# Patient Record
Sex: Male | Born: 1970 | Race: White | Hispanic: No | Smoking: Current some day smoker
Health system: Southern US, Community
[De-identification: ages and names within clinical notes are randomized; demographics above are authoritative.]

## PROBLEM LIST (undated history)

## (undated) DIAGNOSIS — I209 Angina pectoris, unspecified: Secondary | ICD-10-CM

## (undated) DIAGNOSIS — C801 Malignant (primary) neoplasm, unspecified: Secondary | ICD-10-CM

## (undated) DIAGNOSIS — F319 Bipolar disorder, unspecified: Secondary | ICD-10-CM

## (undated) DIAGNOSIS — R918 Other nonspecific abnormal finding of lung field: Secondary | ICD-10-CM

## (undated) DIAGNOSIS — K759 Inflammatory liver disease, unspecified: Secondary | ICD-10-CM

## (undated) DIAGNOSIS — F329 Major depressive disorder, single episode, unspecified: Secondary | ICD-10-CM

## (undated) DIAGNOSIS — K219 Gastro-esophageal reflux disease without esophagitis: Secondary | ICD-10-CM

## (undated) DIAGNOSIS — F32A Depression, unspecified: Secondary | ICD-10-CM

## (undated) HISTORY — PX: TONSILLECTOMY AND ADENOIDECTOMY: SHX28

## (undated) HISTORY — PX: WISDOM TOOTH EXTRACTION: SHX21

## (undated) HISTORY — PX: APPENDECTOMY: SHX54

---
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
## 2004-01-28 ENCOUNTER — Emergency Department (HOSPITAL_COMMUNITY): Admission: EM | Admit: 2004-01-28 | Discharge: 2004-01-28 | Payer: Self-pay | Admitting: Emergency Medicine

## 2004-12-10 ENCOUNTER — Inpatient Hospital Stay (HOSPITAL_COMMUNITY): Admission: EM | Admit: 2004-12-10 | Discharge: 2004-12-13 | Payer: Self-pay | Admitting: Psychiatry

## 2004-12-10 ENCOUNTER — Ambulatory Visit: Payer: Self-pay | Admitting: Psychiatry

## 2007-08-27 ENCOUNTER — Emergency Department (HOSPITAL_COMMUNITY): Admission: EM | Admit: 2007-08-27 | Discharge: 2007-08-27 | Payer: Self-pay | Admitting: Emergency Medicine

## 2010-09-27 ENCOUNTER — Encounter: Payer: Self-pay | Admitting: Psychiatry

## 2011-06-11 LAB — HEPATITIS B CORE ANTIBODY, TOTAL: Hep B Core Total Ab: NEGATIVE

## 2011-06-11 LAB — HEPATITIS C ANTIBODY: HCV Ab: POSITIVE — AB

## 2013-12-17 ENCOUNTER — Emergency Department (HOSPITAL_COMMUNITY)
Admission: EM | Admit: 2013-12-17 | Discharge: 2013-12-17 | Disposition: A | Discharge status: Home / Self Care | Payer: Self-pay | Attending: Emergency Medicine | Admitting: Emergency Medicine

## 2013-12-17 ENCOUNTER — Encounter (HOSPITAL_COMMUNITY): Payer: Self-pay | Admitting: Emergency Medicine

## 2013-12-17 ENCOUNTER — Emergency Department (HOSPITAL_COMMUNITY): Payer: Self-pay

## 2013-12-17 DIAGNOSIS — F411 Generalized anxiety disorder: Secondary | ICD-10-CM | POA: Insufficient documentation

## 2013-12-17 DIAGNOSIS — F17200 Nicotine dependence, unspecified, uncomplicated: Secondary | ICD-10-CM | POA: Insufficient documentation

## 2013-12-17 DIAGNOSIS — R071 Chest pain on breathing: Secondary | ICD-10-CM | POA: Insufficient documentation

## 2013-12-17 DIAGNOSIS — Z8679 Personal history of other diseases of the circulatory system: Secondary | ICD-10-CM | POA: Insufficient documentation

## 2013-12-17 DIAGNOSIS — R0682 Tachypnea, not elsewhere classified: Secondary | ICD-10-CM | POA: Insufficient documentation

## 2013-12-17 DIAGNOSIS — R079 Chest pain, unspecified: Secondary | ICD-10-CM

## 2013-12-17 DIAGNOSIS — R000 Tachycardia, unspecified: Secondary | ICD-10-CM | POA: Insufficient documentation

## 2013-12-17 HISTORY — DX: Angina pectoris, unspecified: I20.9

## 2013-12-17 LAB — PROTIME-INR
INR: 0.99 (ref 0.00–1.49)
Prothrombin Time: 12.9 seconds (ref 11.6–15.2)

## 2013-12-17 LAB — I-STAT TROPONIN, ED: Troponin i, poc: 0 ng/mL (ref 0.00–0.08)

## 2013-12-17 LAB — COMPREHENSIVE METABOLIC PANEL
ALK PHOS: 80 U/L (ref 39–117)
ALT: 35 U/L (ref 0–53)
AST: 31 U/L (ref 0–37)
Albumin: 3.7 g/dL (ref 3.5–5.2)
BILIRUBIN TOTAL: 0.3 mg/dL (ref 0.3–1.2)
BUN: 13 mg/dL (ref 6–23)
CHLORIDE: 98 meq/L (ref 96–112)
CO2: 29 mEq/L (ref 19–32)
CREATININE: 0.84 mg/dL (ref 0.50–1.35)
Calcium: 9.4 mg/dL (ref 8.4–10.5)
GFR calc Af Amer: >90 mL/min (ref >90)
GFR calc non Af Amer: >90 mL/min (ref >90)
Glucose, Bld: 114 mg/dL — ABNORMAL HIGH (ref 70–99)
Potassium: 4.1 mEq/L (ref 3.7–5.3)
Sodium: 140 mEq/L (ref 137–147)
TOTAL PROTEIN: 7.8 g/dL (ref 6.0–8.3)

## 2013-12-17 LAB — CBC
HEMATOCRIT: 41.9 % (ref 39.0–52.0)
HEMOGLOBIN: 14.3 g/dL (ref 13.0–17.0)
MCH: 29.2 pg (ref 26.0–34.0)
MCHC: 34.1 g/dL (ref 30.0–36.0)
MCV: 85.7 fL (ref 78.0–100.0)
Platelets: 182 10*3/uL (ref 150–400)
RBC: 4.89 MIL/uL (ref 4.22–5.81)
RDW: 14.1 % (ref 11.5–15.5)
WBC: 5.2 10*3/uL (ref 4.0–10.5)

## 2013-12-17 LAB — TROPONIN I: Troponin I: <0.3 ng/mL (ref <0.30)

## 2013-12-17 LAB — PRO B NATRIURETIC PEPTIDE: PRO B NATRI PEPTIDE: 36.1 pg/mL (ref 0–125)

## 2013-12-17 LAB — C-REACTIVE PROTEIN: CRP: <0.5 mg/dL — ABNORMAL LOW (ref <0.60)

## 2013-12-17 MED ORDER — NALOXONE HCL 0.4 MG/ML IJ SOLN
0.4000 mg | Freq: Once | INTRAMUSCULAR | Status: DC
  Filled 2013-12-17: qty 1

## 2013-12-17 MED ORDER — ASPIRIN 81 MG PO CHEW
324.0000 mg | CHEWABLE_TABLET | Freq: Once | ORAL | Status: AC
  Administered 2013-12-17: 324 mg via ORAL
  Filled 2013-12-17: qty 4

## 2013-12-17 NOTE — ED Notes (Signed)
Patient refusing narcan stated to friend at bedside "they are trying to make me sick" Doctor notified.

## 2013-12-17 NOTE — ED Notes (Signed)
Pt unable to void at this time. 

## 2013-12-17 NOTE — ED Provider Notes (Signed)
CSN: [id number]     Arrival date & time 12/17/13  7096 History   First MD Initiated Contact with Patient 12/17/13 [phone]     Chief Complaint  Patient presents with  . Chest Pain  . Shortness of Breath      HPI  Patient presents with concerns of sternal and left chest pain. Pain began earlier today, though the exact time of onset is unclear. Patient notes that he has been using cocaine and heroin in the hours prior to onset of pain. Since onset has been persistent, nonradiating, sore, severe. There is mild associated dyspnea, no syncope. No cough, no fever, no chills. No vomiting. Patient injects heroin, denies cutaneous changes. Since onset there was no clear alleviating or exacerbating factors.  Notably, after my initial evaluation the patient immediately requested emergent fax to be sent to the court house because the patient has a court appearance today.   Past Medical History  Diagnosis Date  . Anginal pain    History reviewed. No pertinent past surgical history. History reviewed. No pertinent family history. History  Substance Use Topics  . Smoking status: Current Every Day Smoker  . Smokeless tobacco: Not on file  . Alcohol Use: No    Review of Systems  Constitutional:       Per HPI, otherwise negative  HENT:       Per HPI, otherwise negative  Respiratory:       Per HPI, otherwise negative  Cardiovascular:       Per HPI, otherwise negative  Gastrointestinal: Negative for vomiting.  Endocrine:       Negative aside from HPI  Genitourinary:       Neg aside from HPI   Musculoskeletal:       Per HPI, otherwise negative  Skin: Negative.   Neurological: Negative for syncope.      Allergies  Review of patient's allergies indicates no known allergies.  Home Medications  No current outpatient prescriptions on file. BP 137/81  Pulse 92  Temp(Src) 97.7 F (36.5 C) (Oral)  Resp 22  SpO2 99% Physical Exam  Nursing note and vitals  reviewed. Constitutional: He is oriented to person, place, and time. He appears well-developed. No distress.  Anxious middle-aged male sitting upright, speaking clearly  HENT:  Head: Normocephalic and atraumatic.  Eyes: Conjunctivae and EOM are normal.  Cardiovascular: Regular rhythm.  Tachycardia present.   No murmur heard. Pulmonary/Chest: No stridor. Tachypnea noted. No respiratory distress. He has no decreased breath sounds.  Abdominal: He exhibits no distension.  Musculoskeletal: He exhibits no edema.  Neurological: He is alert and oriented to person, place, and time. He displays no atrophy and no tremor. No cranial nerve deficit or sensory deficit. He exhibits normal muscle tone. He displays no seizure activity.  Skin: Skin is warm and dry.     Psychiatric: His mood appears anxious. Cognition and memory are not impaired.    ED Course  Procedures (including critical care time) Labs Review Labs Reviewed  COMPREHENSIVE METABOLIC PANEL - Abnormal; Notable for the following:    Glucose, Bld 114 (*)    All other components within normal limits  CBC  PRO B NATRIURETIC PEPTIDE  PROTIME-INR  TROPONIN I  URINE RAPID DRUG SCREEN (HOSP PERFORMED)  URINALYSIS, ROUTINE W REFLEX MICROSCOPIC  C-REACTIVE PROTEIN   Imaging Review No results found.   EKG Interpretation   Date/Time:  Monday December 17 2013 08:35:34 EDT Ventricular Rate:  105 PR Interval:  150 QRS Duration: 78 QT  Interval:  316 QTC Calculation: 417 R Axis:   85 Text Interpretation:  Sinus tachycardia Anterior infarct , age  undetermined Abnormal ECG Sinus tachycardia Artifact Abnormal ekg  Confirmed by Carmin Muskrat  MD ([id number]) on 12/17/2013 8:40:26 AM      12:15 PM Patient is chest pain-free. I had a lengthy conversation with the patient about substance abuse cessation. We also discussed the need for her primary care followup.  MDM   Patient presents with pain.  Notably, the patient has been using cocaine  and heroin within the past day.  Patient is initially tachycardic, tachypneic, but these resolved and he is chest pain-free.  Patient's evaluation here including serial troponins is reassuring.  I had a lengthy conversation with the patient about the need to stop cocaine and heroin use.  He is discharged in stable condition.  Carmin Muskrat, MD 12/17/13 [phone]

## 2013-12-17 NOTE — ED Notes (Signed)
Pt is refusing to give urine. Pt States "I have given y'all everything that y'all need to figure out what is wrong with my chest".

## 2013-12-17 NOTE — ED Notes (Signed)
Patient and patient friend at bedside on phone states when he got off phone that there needs to be a fax sent immediate to the courts within 15 minutes.  Doctor stated would be able to give a Doctors note. Friend stated he will get arrested if no fax is sent.  [place] department notified to speak with patient and friend at bedside.

## 2013-12-17 NOTE — ED Notes (Signed)
Pt c/o mid sternal CP with SOB; pt hyperventilating at present; pt sts pain is constant and severe; pt admits to heroin and cocaine use yesterday

## 2013-12-17 NOTE — ED Notes (Signed)
Police officer spoke with patient and returned after calling the court they are aware patient is hospital and instructed patient to contact appropriate personal. Verbalized understanding.

## 2013-12-17 NOTE — Discharge Instructions (Signed)
As discussed, the single most important thing you can do is to stop using cocaine and heroin.  Please follow up with your primary care physician for further evaluation of chest pain.    Emergency Department Resource Guide 1) Find a Doctor and Pay Out of Pocket Although you won't have to find out who is covered by your insurance plan, it is a good idea to ask around and get recommendations. You will then need to call the office and see if the doctor you have chosen will accept you as a new patient and what types of options they offer for patients who are self-pay. Some doctors offer discounts or will set up payment plans for their patients who do not have insurance, but you will need to ask so you aren't surprised when you get to your appointment.  2) Contact Your Local Health Department Not all health departments have doctors that can see patients for sick visits, but many do, so it is worth a call to see if yours does. If you don't know where your local health department is, you can check in your phone book. The CDC also has a tool to help you locate your state's health department, and many state websites also have listings of all of their local health departments.  3) Find a [facility] If your illness is not likely to be very severe or complicated, you may want to try a walk in clinic. These are popping up all over the country in pharmacies, drugstores, and shopping centers. They're usually staffed by nurse practitioners or physician assistants that have been trained to treat common illnesses and complaints. They're usually fairly quick and inexpensive. However, if you have serious medical issues or chronic medical problems, these are probably not your best option.  No Primary Care Doctor: - Call Health Connect at  [phone] - they can help you locate a primary care doctor that  accepts your insurance, provides certain services, etc. - Physician Referral Service- [phone]  Chronic Pain  Problems: Organization         Address  Phone   Notes  [facility]  [phone] Patients need to be referred by their primary care doctor.   Medication Assistance: Organization         Address  Phone   Notes  [facility] Medication [facility] [address] [place], [place] [address] [phone] --Must be a resident of [facility] -- Must have NO insurance coverage whatsoever (no Medicaid/ Medicare, etc.) -- The pt. MUST have a primary care doctor that directs their care regularly and follows them in the community   [facility]ist  [phone]   [employer]  [phone]    Agencies that provide inexpensive medical care: Organization         Address  Phone   Notes  [place]  [phone]   [facility]    [phone]   [facility] [place], [place] [address] [phone]   [place] [address] [place] [phone]   Planned Parenthood    [phone]   [facility]    [phone]   [place] and [place] [address], [place] Phone:  [phone], Fax:  [phone] Hours of Operation:  9 am - 6 pm, M-F.  Also accepts Medicaid/Medicare and self-pay.  [facility] for Children  [place] [address], [address], [place] Phone: [phone], Fax: [phone]. Hours of Operation:  8:30 am - 5:30 pm, M-F.  Also accepts Medicaid and self-pay.  [facility] [place] [address], [place] Phone: [phone]   [place], [place], [place] [phone], Ext. [phone] Mondays & Thursdays: 7-9 AM.  First 15 patients are seen on a first come, first serve basis.    [place] Providers:  Organization         Address  Phone   Notes  [facility] [address], [place] [phone] Also  accepts self-pay patients.  [facility] [address], [place]  [phone]   [place], [address], [place] [phone]   [facility] [address], [place] [phone]   Lucianne Lei [address], [place]   [phone] Only accepts [place] Access [place] patients after they have their name applied to their card.   Self-Pay (no insurance) in [facility]:  Organization         Address  Phone   Notes  Sickle Cell Patients, [facility] [place] [phone]   [facility] [place] [phone]   [facility] [place]  [place], [place], [place] [phone]   [facility]/Dr. Osei-Bonsu  [address] [place] or [address], [place] [phone] Phone number for both [place] and [place] locations is the same.  Urgent Medical and [facility] [address], [place] [phone]   [facility] [address], [place] or [address] [phone]   [facility] [address] [place] [phone], phone; [phone], fax Sees patients 1st and 3rd Saturday of every month.  Must not qualify for public or private insurance (i.e. Medicaid, Medicare, [place] Health Choice, Veterans' Benefits)  Household income should be no more than 200% of the poverty level The clinic cannot treat you if you are pregnant or think you are pregnant  Sexually transmitted diseases are not treated at the clinic.   Dental Care: Organization         Address  Phone  Notes  [facility] Department of [facility] [place] [phone] Accepts children up to age 79 who are enrolled in [place] or [place]; pregnant  women with a Medicaid card; and children who have applied for Medicaid or [place] Health Choice, but were declined, whose parents can pay a reduced fee at time of service.  [facility]  [address], [place] [phone] Accepts children up to age 77 who are enrolled in [place] or [place]; pregnant women with a Medicaid card; and children who have applied for Medicaid or [place] Health Choice, but were declined, whose parents can pay a reduced fee at time of service.  [place] Adult [facility] Access PROGRAM  [place] [phone] Patients are seen by appointment only. Walk-ins are not accepted. [place] will see patients 3 years of age and older. Monday - Tuesday (8am-5pm) Most Wednesdays (8:30-5pm) $30 per visit, cash only  [place]  Access PROGRAM  [address], [facility] [phone] Patients are seen by appointment only. Walk-ins are not accepted. [place] will see patients 37 years of age and older. One Wednesday Evening (Monthly: Volunteer Based).  $30 per visit, cash only  [place]  [phone] for adults; Children under age 77, call Graduate Pediatric Dentistry at [phone]. Children aged 79-14, please call [phone] to request a pediatric application.  Dental services are provided in all areas of dental care including fillings, crowns and bridges, complete and partial dentures, implants, gum treatment, root canals, and extractions. Preventive care is also provided. Treatment is provided to both adults and children. Patients are selected via a lottery and there is often a waiting list.   [facility] [address], [place]  [phone] [email or web address]   [facility] [address] [place], [place] [phone], Ext. [phone] Second and Fourth Thursday of each month, opens at 6:30 AM; Clinic ends at 9 AM.  Patients are  seen on a first-come first-served basis, and a limited number are seen during each clinic.   [facility]  [address] [place] Danker [place], [place] [phone]   Eligibility Requirements You must have lived in [place], [place], or [place] counties for at least the last three months.   You cannot be eligible for state or federal sponsored [employer], including [employer], [place], or [employer].   You generally cannot be eligible for healthcare insurance through your employer.    How to apply: Eligibility screenings are held every Tuesday and Wednesday afternoon from 1:00 pm until 4:00 pm. You do not need an appointment for the interview!  [facility] [address], [place], [place]   [place]  [place] Department  [place]  [phone]    Behavioral Health Resources in the Community: Intensive Outpatient Programs Organization         Address  Phone  Notes  [place]. [address] [place], [place] [phone]   [facility] [address], [place], [place]   ADS: Alcohol & Drug Svcs [address] [place], [place]   [place] [address],  [place], [place] or [phone]   Substance Abuse Resources Organization         Address  Phone  Notes  Alcohol and Drug Services  [phone]   [employer]  [phone]   [facility] [place]   Chinita Pester  [phone]   Residential & Outpatient Substance Abuse Program  [phone]   Psychological Services Organization         Address  Phone  Notes  [facility] [address] [place]  [place]  [phone]   [place] [address] [place] [phone] or [phone]    Mobile Crisis  Teams Organization         Address  Phone  Notes  [facility], Mobile Crisis Care Unit  [phone]   [facility] Psychotherapeutic Services  [address] [place], [place]   [place] [address], [place] [phone]    Self-Help/Support Groups Organization         Address  Phone             Notes  Mental Health  [place] - variety of support groups  336- H3156881 Call for more information  Narcotics Anonymous (NA), [facility] [address], [employer]   2 meetings at this location   Special educational needs teacher         Address  Phone  Notes  ASAP Residential Treatment [place],    [place]  [phone]   [facility]  [address] [place] 725366, [place], [place]   [place], [place] [phone] Admissions: 8am-3pm M-F  Incentives Substance [place] [address],    [place], [place] [phone]   [facility] [address] [place], [place], [place]   [facility] [address],  [place], [place]   [facility]ight Programs - Intensive Outpatient [address], [place], [place]   [facility] ([address].) [place].,  [place], [place] [phone] or [phone]   [facility]idential Treatment Services (RTS) [address] [place], [place] Accepts Medicaid  [facility] [place] [address],  [place] [phone] Substance Abuse/Addiction Treatment   [facility] Organization         Address  Phone  Notes  [facility]  [phone]   Domenic Schwab, PhD [address] Arlis [place], [place]   [phone] or [phone]   [place], [place] [phone]   [facility] [address], [place], [place] [phone]  Insurance/Medicaid/sponsorship through [facility] and Families [address] [place] [address], [address] [place]                                    [place], [place] [phone] [place] [address], [place] [phone]    Dr. Adele Schilder  [phone]   [facility] Dept. 1) [address], [place] 2) [place] 3)  [place] [address], [place] [phone]  [phone]   [place] [phone] or [phone] (After Hours)

## 2013-12-17 NOTE — ED Notes (Signed)
PT ambulated with baseline gait; VSS; A&Ox3; no signs of distress; respirations even and unlabored; skin warm and dry; no questions upon discharge.  

## 2013-12-17 NOTE — ED Notes (Signed)
Pt reports no chest pain at this time.

## 2019-09-25 ENCOUNTER — Emergency Department (HOSPITAL_COMMUNITY): Payer: Medicaid Other

## 2019-09-25 ENCOUNTER — Emergency Department (HOSPITAL_COMMUNITY)
Admission: EM | Admit: 2019-09-25 | Discharge: 2019-09-25 | Discharge status: Against Medical Advice | Payer: Medicaid Other | Attending: Emergency Medicine | Admitting: Emergency Medicine

## 2019-09-25 ENCOUNTER — Other Ambulatory Visit: Payer: Self-pay

## 2019-09-25 ENCOUNTER — Encounter (HOSPITAL_COMMUNITY): Payer: Self-pay | Admitting: Emergency Medicine

## 2019-09-25 DIAGNOSIS — S2241XA Multiple fractures of ribs, right side, initial encounter for closed fracture: Secondary | ICD-10-CM | POA: Diagnosis not present

## 2019-09-25 DIAGNOSIS — Y999 Unspecified external cause status: Secondary | ICD-10-CM | POA: Diagnosis not present

## 2019-09-25 DIAGNOSIS — X500XXA Overexertion from strenuous movement or load, initial encounter: Secondary | ICD-10-CM | POA: Insufficient documentation

## 2019-09-25 DIAGNOSIS — C7951 Secondary malignant neoplasm of bone: Secondary | ICD-10-CM | POA: Insufficient documentation

## 2019-09-25 DIAGNOSIS — Y939 Activity, unspecified: Secondary | ICD-10-CM | POA: Diagnosis not present

## 2019-09-25 DIAGNOSIS — F17210 Nicotine dependence, cigarettes, uncomplicated: Secondary | ICD-10-CM | POA: Insufficient documentation

## 2019-09-25 DIAGNOSIS — Y929 Unspecified place or not applicable: Secondary | ICD-10-CM | POA: Insufficient documentation

## 2019-09-25 DIAGNOSIS — S299XXA Unspecified injury of thorax, initial encounter: Secondary | ICD-10-CM | POA: Diagnosis present

## 2019-09-25 LAB — CBC WITH DIFFERENTIAL/PLATELET
Abs Immature Granulocytes: 0.03 10*3/uL (ref 0.00–0.07)
Basophils Absolute: 0.1 10*3/uL (ref 0.0–0.1)
Basophils Relative: 1 %
Eosinophils Absolute: 0.2 10*3/uL (ref 0.0–0.5)
Eosinophils Relative: 2 %
HCT: 43.9 % (ref 39.0–52.0)
Hemoglobin: 14.4 g/dL (ref 13.0–17.0)
Immature Granulocytes: 0 %
Lymphocytes Relative: 26 %
Lymphs Abs: 2.2 10*3/uL (ref 0.7–4.0)
MCH: 28.2 pg (ref 26.0–34.0)
MCHC: 32.8 g/dL (ref 30.0–36.0)
MCV: 85.9 fL (ref 80.0–100.0)
Monocytes Absolute: 1 10*3/uL (ref 0.1–1.0)
Monocytes Relative: 11 %
Neutro Abs: 5.2 10*3/uL (ref 1.7–7.7)
Neutrophils Relative %: 60 %
Platelets: 259 10*3/uL (ref 150–400)
RBC: 5.11 MIL/uL (ref 4.22–5.81)
RDW: 13.3 % (ref 11.5–15.5)
WBC: 8.6 10*3/uL (ref 4.0–10.5)
nRBC: 0 % (ref 0.0–0.2)

## 2019-09-25 LAB — BASIC METABOLIC PANEL
Anion gap: 8 (ref 5–15)
BUN: 10 mg/dL (ref 6–20)
CO2: 23 mmol/L (ref 22–32)
Calcium: 7.6 mg/dL — ABNORMAL LOW (ref 8.9–10.3)
Chloride: 106 mmol/L (ref 98–111)
Creatinine, Ser: 0.49 mg/dL — ABNORMAL LOW (ref 0.61–1.24)
GFR calc Af Amer: >60 mL/min (ref >60)
GFR calc non Af Amer: >60 mL/min (ref >60)
Glucose, Bld: 91 mg/dL (ref 70–99)
Potassium: 3 mmol/L — ABNORMAL LOW (ref 3.5–5.1)
Sodium: 137 mmol/L (ref 135–145)

## 2019-09-25 LAB — SEDIMENTATION RATE: Sed Rate: 50 mm/hr — ABNORMAL HIGH (ref 0–16)

## 2019-09-25 LAB — C-REACTIVE PROTEIN: CRP: 2.7 mg/dL — ABNORMAL HIGH (ref <1.0)

## 2019-09-25 MED ORDER — OXYCODONE-ACETAMINOPHEN 5-325 MG PO TABS
1.0000 | ORAL_TABLET | Freq: Once | ORAL | Status: AC
  Administered 2019-09-25: 1 via ORAL
  Filled 2019-09-25: qty 1

## 2019-09-25 MED ORDER — GADOBUTROL 1 MMOL/ML IV SOLN
7.5000 mL | Freq: Once | INTRAVENOUS | Status: AC | PRN
  Administered 2019-09-25: 7.5 mL via INTRAVENOUS

## 2019-09-25 MED ORDER — HYDROMORPHONE HCL 1 MG/ML IJ SOLN
1.0000 mg | Freq: Once | INTRAMUSCULAR | Status: AC
  Administered 2019-09-25: 1 mg via INTRAVENOUS
  Filled 2019-09-25: qty 1

## 2019-09-25 NOTE — ED Triage Notes (Signed)
Pt reports pain in back along with pain radiation down both legs and pain with walking. Pt reports pain has been ongoing for the last week.

## 2019-09-25 NOTE — ED Provider Notes (Signed)
[place] DEPT Provider Note   CSN: [id number] Arrival date & time: 09/25/19  0402     History Chief Complaint  Patient presents with  . Back Pain    Nicolas Welch is a 49 y.o. male.  Presents to ER with chief complaint back pain.  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  No trauma.  Progressive pain, now severe, improved with rest, worse with ambulation or bearing weight.  Radiates down left hip, left leg.  Denies prior history of back pain.  Has been taking opiates off the street as well as heroin with some improvement in his pain.  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  Denies prior history of endocarditis, bacteremia, epidural abscess.  Patient also states for the last couple weeks he has been having intermittent right shoulder pain, felt a popping sensation after lifting something heavy, this is currently not hurting him.  Also has some pain on the right side of his chest wall, that was associated with the shoulder.  No fevers or chills.  Patient denies any bladder or bowel incontinence, no saddle anesthesia.  States he feels his left leg is somewhat weak and occasionally has to pick it up.  20 pound unintentional weight loss over the past month.  HPI     Past Medical History:  Diagnosis Date  . [place]inal pain ([place]     There are no problems to display for this patient.   History reviewed. No pertinent surgical history.     History reviewed. No pertinent family history.  Social History   Tobacco Use  . Smoking status: Current Every Day Smoker    Packs/day: 1.00    Types: Cigarettes  . Smokeless tobacco: Never Used  Substance Use Topics  . Alcohol use: No  . Drug use: Yes    Types: Cocaine, IV    Comment: heroin    Home Medications Prior to Admission medications   Not  on File    Allergies    Patient has no known allergies.  Review of Systems   Review of Systems  Constitutional: Negative for chills and fever.  HENT: Negative for ear pain and sore throat.   Eyes: Negative for pain and visual disturbance.  Respiratory: Negative for cough and shortness of breath.   Cardiovascular: Negative for chest pain and palpitations.  Gastrointestinal: Negative for abdominal pain and vomiting.  Genitourinary: Negative for dysuria and hematuria.  Musculoskeletal: Positive for back pain and myalgias. Negative for arthralgias.  Skin: Negative for color change and rash.  Neurological: Negative for seizures and syncope.  All other systems reviewed and are negative.   Physical Exam Updated Vital Signs BP 117/78 (BP Location: Right Arm)   Pulse 86   Temp 98 F (36.7 C) (Oral)   Resp 17   Ht 6' (1.829 m)   Wt 70.3 kg   SpO2 95%   BMI 21.02 kg/m   Physical Exam Vitals and nursing note reviewed.  Constitutional:      Appearance: He is well-developed.  HENT:     Head: Normocephalic and atraumatic.  Eyes:     Conjunctiva/sclera: Conjunctivae normal.  Cardiovascular:     Rate and Rhythm: Normal rate and regular rhythm.     Heart sounds: No murmur.  Pulmonary:     Effort: Pulmonary effort is normal. No respiratory  distress.     Breath sounds: Normal breath sounds.  Abdominal:     Palpations: Abdomen is soft.     Tenderness: There is no abdominal tenderness.  Musculoskeletal:     Cervical back: Neck supple.     Comments: No C-spine tenderness, there is focal tenderness over the lower T-spine and upper L-spine, no deformity or step-off noted No tenderness over extremities  Skin:    General: Skin is warm and dry.  Neurological:     Mental Status: He is alert.     Comments: And O x3, sensation to light touch intact throughout extremities;  RLE - 5/5 strength in hip flexion/extension, leg flexion/extension, plantar flexion, dorsiflexion and FHL LLE: 4+/5  strength in hip flexion, otherwise 5/5 throughout hip extension, leg flexion/extension, plantar flexion, dorsiflexion and FHL RUE: 5/5 strenght throughout LUE: 5/5 strenght throughout     ED Results / Procedures / Treatments   Labs (all labs ordered are listed, but only abnormal results are displayed) Labs Reviewed  BASIC METABOLIC PANEL - Abnormal; Notable for the following components:      Result Value   Potassium 3.0 (*)    Creatinine, Ser 0.49 (*)    Calcium 7.6 (*)    All other components within normal limits  SEDIMENTATION RATE - Abnormal; Notable for the following components:   Sed Rate 50 (*)    All other components within normal limits  C-REACTIVE PROTEIN - Abnormal; Notable for the following components:   CRP 2.7 (*)    All other components within normal limits  RESPIRATORY PANEL BY RT PCR (FLU A&B, COVID)  CBC WITH DIFFERENTIAL/PLATELET    EKG None  Radiology DG Ribs Unilateral W/Chest Right  Result Date: 09/25/2019 CLINICAL DATA:  Recent fall several days ago with right-sided chest pain, initial encounter EXAM: RIGHT RIBS AND CHEST - 3+ VIEW COMPARISON:  None. FINDINGS: Cardiac shadow is within normal limits. The lungs are well aerated bilaterally. Right basilar atelectatic changes are seen. No pneumothorax is noted. The known rib fractures involving the first, second and third ribs on the right posteriorly are again seen and stable. Varying degrees of healing are noted. Undisplaced right tenth rib fracture anterior laterally is noted as well. IMPRESSION: Multiple right-sided rib fractures as described above. No pneumothorax is noted. Mild right basilar atelectasis is seen. Electronically Signed   By: Inez Catalina M.D.   On: 09/25/2019 09:45   DG Thoracic Spine 2 View  Result Date: 09/25/2019 CLINICAL DATA:  Recent fall with upper back pain, initial encounter EXAM: THORACIC SPINE 2 VIEWS COMPARISON:  None. FINDINGS: Vertebral body height is well maintained. Mild  osteophytic changes are noted within the cervical spine. No paraspinal mass lesion is seen. The pedicles are within normal limits. Known right rib fractures are again visualized and stable. IMPRESSION: No acute abnormality in the thoracic spine. Electronically Signed   By: Inez Catalina M.D.   On: 09/25/2019 09:46   DG Lumbar Spine 2-3 Views  Result Date: 09/25/2019 CLINICAL DATA:  Fall several days ago with back pain, initial encounter EXAM: LUMBAR SPINE - 2-3 VIEW COMPARISON:  None. FINDINGS: Five lumbar type vertebral bodies are well visualized. Vertebral body height is well maintained. No anterolisthesis is noted. The right pedicle at L3 is incomplete inferiorly raising suspicion for underlying bony lesion. IMPRESSION: Irregularity of the right pedicle at L3. No other focal abnormality is noted. MRI of the lumbar spine with and without contrast material is recommended for further evaluation. Electronically Signed  By: Inez Catalina M.D.   On: 09/25/2019 09:50   DG Shoulder Right  Result Date: 09/25/2019 CLINICAL DATA:  Recent fall several days ago with right shoulder pain, initial encounter EXAM: RIGHT SHOULDER - 2+ VIEW COMPARISON:  None. FINDINGS: Right clavicle and proximal right humerus are within normal limits. There are fractures of the first second and third ribs on the right with varying degrees of healing. No pneumothorax is noted. IMPRESSION: Multiple right rib fractures with varying degrees of healing. Electronically Signed   By: Inez Catalina M.D.   On: 09/25/2019 09:44   MR THORACIC SPINE W WO CONTRAST  Result Date: 09/25/2019 CLINICAL DATA:  Back pain. Abnormal radiographs. EXAM: MRI THORACIC AND LUMBAR SPINE WITHOUT AND WITH CONTRAST TECHNIQUE: Multiplanar and multiecho pulse sequences of the thoracic and lumbar spine were obtained without and with intravenous contrast. CONTRAST:  7.46mL GADAVIST GADOBUTROL 1 MMOL/ML IV SOLN COMPARISON:  Radiographs, same date. FINDINGS: MRI THORACIC  SPINE FINDINGS Alignment:  Normal Vertebrae: Extensive metastatic disease involving the thoracic vertebral bodies and posterior elements. No obvious pathologic fracture. No spinal canal compromise. Cord:  No definite cord lesions or cord compression. Paraspinal and other soft tissues: Perivertebral tumor noted at T5 and T6. Multiple rib lesions are also noted. No obvious lung lesions or mediastinal tumor. Disc levels: No canal stenosis is demonstrated. No obvious foraminal lesions. Small left paracentral disc protrusion noted at T8-9 and small right paracentral disc protrusion at T9-10. MRI LUMBAR SPINE FINDINGS Segmentation: There are five lumbar type vertebral bodies. The last full intervertebral disc space is labeled L5-S1. Alignment:  Normal Vertebrae: Extensive metastatic bone disease with innumerable lesions throughout the lumbar spine and sacrum. No pathologic fracture or spinal canal stenosis. Conus medullaris: Extends to the T12-L1 level and appears normal. Paraspinal and other soft tissues: No significant paraspinal or retroperitoneal findings. No obvious renal or pancreatic lesions. No retroperitoneal mass or adenopathy. Disc levels: Diffuse disc bulge at L3-4 with mild spinal and lateral recess stenosis. IMPRESSION: 1. 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 No pathologic fracture or spinal canal compromise. 2. Small thoracic disc protrusions but no significant canal or foraminal stenosis. 3. Diffuse disc bulge at L3-4 with mild spinal and lateral recess stenosis. 4. No obvious primary tumor is identified. Recommend metastatic workup with a CT scan of the chest, abdomen and pelvis with IV and oral contrast. Electronically Signed   By: Marijo Sanes M.D.   On: 09/25/2019 15:08   MR Lumbar Spine W Wo Contrast  Result Date: 09/25/2019 CLINICAL DATA:  Back pain. Abnormal radiographs. EXAM: MRI THORACIC AND LUMBAR SPINE WITHOUT AND WITH CONTRAST TECHNIQUE:  Multiplanar and multiecho pulse sequences of the thoracic and lumbar spine were obtained without and with intravenous contrast. CONTRAST:  7.38mL GADAVIST GADOBUTROL 1 MMOL/ML IV SOLN COMPARISON:  Radiographs, same date. FINDINGS: MRI THORACIC SPINE FINDINGS Alignment:  Normal Vertebrae: Extensive metastatic disease involving the thoracic vertebral bodies and posterior elements. No obvious pathologic fracture. No spinal canal compromise. Cord:  No definite cord lesions or cord compression. Paraspinal and other soft tissues: Perivertebral tumor noted at T5 and T6. Multiple rib lesions are also noted. No obvious lung lesions or mediastinal tumor. Disc levels: No canal stenosis is demonstrated. No obvious foraminal lesions. Small left paracentral disc protrusion noted at T8-9 and small right paracentral disc protrusion at T9-10. MRI LUMBAR SPINE FINDINGS Segmentation: There are five lumbar type vertebral bodies. The last full intervertebral disc space is labeled L5-S1.  Alignment:  Normal Vertebrae: Extensive metastatic bone disease with innumerable lesions throughout the lumbar spine and sacrum. No pathologic fracture or spinal canal stenosis. Conus medullaris: Extends to the T12-L1 level and appears normal. Paraspinal and other soft tissues: No significant paraspinal or retroperitoneal findings. No obvious renal or pancreatic lesions. No retroperitoneal mass or adenopathy. Disc levels: Diffuse disc bulge at L3-4 with mild spinal and lateral recess stenosis. IMPRESSION: 1. 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 No pathologic fracture or spinal canal compromise. 2. Small thoracic disc protrusions but no significant canal or foraminal stenosis. 3. Diffuse disc bulge at L3-4 with mild spinal and lateral recess stenosis. 4. No obvious primary tumor is identified. Recommend metastatic workup with a CT scan of the chest, abdomen and pelvis with IV and oral contrast.  Electronically Signed   By: Marijo Sanes M.D.   On: 09/25/2019 15:08    Procedures Procedures (including critical care time)  Medications Ordered in ED Medications  HYDROmorphone (DILAUDID) injection 1 mg (1 mg Intravenous Given 09/25/19 0856)  oxyCODONE-acetaminophen (PERCOCET/ROXICET) 5-325 MG per tablet 1 tablet (1 tablet Oral Given 09/25/19 1309)  gadobutrol (GADAVIST) 1 MMOL/ML injection 7.5 mL (7.5 mLs Intravenous Contrast Given 09/25/19 1433)    ED Course  I have reviewed the triage vital signs and the nursing notes.  Pertinent labs & imaging results that were available during my care of the patient were reviewed by me and considered in my medical decision making (see chart for details).  Clinical Course as of Sep 24 1552  Tue Sep 25, 2019  1532 Updated patient on results, will check CT chest abdomen pelvis to further evaluate   [RD]    Clinical Course User Index [RD] Lucrezia Starch, MD   MDM Rules/Calculators/A&P                      49 year old male presents to ER with chief complaint of back pain.  On exam patient was noted be well-appearing, noted some tenderness over his chest wall as well as focal bony tenderness over his T and L-spine.  X-rays concerning for couple of rib fractures of uncertain acuity.  No recent falls, no tachypnea, no hypoxia, no pneumonia or pneumothorax.  More concerning though his MRI demonstrated extensive metastatic disease.  There was question of some mild left leg weakness, MRI also showed some mild spinal stenosis and lumbar spine, neurosurgery will evaluate patient.  To further evaluate and identify etiology for his metastatic disease, CT chest abdomen pelvis with IV and oral contrast was ordered.  While awaiting these orders, patient signed out to Dr. Zenia Resides.  Plan and disposition pending CTs. patient updated on results and likely new malignancy.  Final Clinical Impression(s) / ED Diagnoses Final diagnoses:  Spine metastasis ([place])  Closed  fracture of multiple ribs of right side, initial encounter    Rx / DC Orders ED Discharge Orders    None       Lucrezia Starch, MD 09/25/19 1555

## 2019-09-25 NOTE — ED Provider Notes (Signed)
Patient signed to me by Dr. Roslynn Amble pending abdominal chest CTs for evaluation of malignancy.  Was informed by nursing that patient does not wish to stay at this time.  I explained to the patient at length about the risk of metastatic cancer and the need for inpatient evaluation he has deferred.  He understands this.  He was strongly encouraged to return or follow-up with his doctor   Lacretia Leigh, MD 09/25/19 1641

## 2019-09-25 NOTE — ED Notes (Signed)
Patient stated that he wanted to leave AMA, refused his CT scan and COVID swab. Explained the risks of leaving, MD Zenia Resides made aware of wishes to leave AMA, Zenia Resides spoke with patient and he still wanted to leave AMA.

## 2019-09-25 NOTE — Consult Note (Signed)
Chief Complaint   Chief Complaint  Patient presents with  . Back Pain    HPI   Consult requested by: Dr Roslynn Amble, EDP [facility] Reason for consult: thoracic and lumbar spinal mets  HPI: Nicolas Welch is a 49 y.o. male with history of HTN (no longer on medication), IVDU, current every day smoker who presented to ED for evaluation of progressive lower back pain over the past week. He complains primarily of midline LBP that radiates into left leg to posterolaterally to foot. Occasionally has pain in right leg. Endorses weakness in left leg with HF. No distal weakness. No bowel or bladder dysfunction, N/T. 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 NSY consultation requested.   There are no problems to display for this patient.   PMH: Past Medical History:  Diagnosis Date  . Anginal pain (HCC)     PSH: History reviewed. No pertinent surgical history.  (Not in a hospital admission)   SH: Social History   Tobacco Use  . Smoking status: Current Every Day Smoker    Packs/day: 1.00    Types: Cigarettes  . Smokeless tobacco: Never Used  Substance Use Topics  . Alcohol use: No  . Drug use: Yes    Types: Cocaine, IV    Comment: heroin    MEDS: Prior to Admission medications   Not on File    ALLERGY: No Known Allergies  Social History   Tobacco Use  . Smoking status: Current Every Day Smoker    Packs/day: 1.00    Types: Cigarettes  . Smokeless tobacco: Never Used  Substance Use Topics  . Alcohol use: No     History reviewed. No pertinent family history.   ROS   Review of Systems  Constitutional: Positive for weight loss. Negative for chills and fever.  HENT: Negative.   Eyes: Negative.   Respiratory: Negative.   Cardiovascular: Negative.   Gastrointestinal: Negative.   Genitourinary: Negative.   Musculoskeletal: Positive for back pain, joint pain and myalgias.  Negative for neck pain.  Skin: Negative.   Neurological: Positive for tingling and weakness. Negative for dizziness, tremors, sensory change, speech change, focal weakness and headaches.    Exam   Vitals:   09/25/19 0744 09/25/19 1446  BP:  117/78  Pulse: 100 86  Resp:  17  Temp: 98 F (36.7 C)   SpO2:  95%   General appearance: WDWN, NAD Eyes: No scleral injection Cardiovascular: Regular rate and rhythm without murmurs, rubs, gallops. No edema or variciosities. Distal pulses normal. Pulmonary: Effort normal, non-labored breathing Musculoskeletal:     Muscle tone upper extremities: Normal    Muscle tone lower extremities: Normal    Motor exam: Upper Extremities Deltoid Bicep Tricep Grip  Right 5/5 5/5 5/5 5/5  Left 5/5 5/5 5/5 5/5   Lower Extremity IP Quad PF DF EHL  Right 5/5 5/5 5/5 5/5 5/5  Left 4/4+/5 5/5 5/5 5/5 5/5   Neurological Mental Status:    - Patient is awake, alert, oriented to person, place, month, year, and situation    - Patient is able to give a clear and coherent history.    - No signs of aphasia or neglect Cranial Nerves    - II: Visual Fields are full. PERRL    - III/IV/VI: EOMI without ptosis or diploplia.     - V: Facial sensation is grossly normal    -  VII: Facial movement is symmetric.     - VIII: hearing is intact to voice    - X: Uvula elevates symmetrically    - XI: Shoulder shrug is symmetric.    - XII: tongue is midline without atrophy or fasciculations.  Sensory: Sensation grossly intact to LT Deep Tendon Reflexes    - 2+ and symmetric in the biceps and patellae.   Results - Imaging/Labs   Results for orders placed or performed during the hospital encounter of 09/25/19 (from the past 48 hour(s))  CBC with Differential     Status: None   Collection Time: 09/25/19  8:51 AM  Result Value Ref Range   WBC 8.6 4.0 - 10.5 K/uL   RBC 5.11 4.22 - 5.81 MIL/uL   Hemoglobin 14.4 13.0 - 17.0 g/dL   HCT 43.9 39.0 - 52.0 %   MCV 85.9 80.0 -  100.0 fL   MCH 28.2 26.0 - 34.0 pg   MCHC 32.8 30.0 - 36.0 g/dL   RDW 13.3 11.5 - 15.5 %   Platelets 259 150 - 400 K/uL   nRBC 0.0 0.0 - 0.2 %   Neutrophils Relative % 60 %   Neutro Abs 5.2 1.7 - 7.7 K/uL   Lymphocytes Relative 26 %   Lymphs Abs 2.2 0.7 - 4.0 K/uL   Monocytes Relative 11 %   Monocytes Absolute 1.0 0.1 - 1.0 K/uL   Eosinophils Relative 2 %   Eosinophils Absolute 0.2 0.0 - 0.5 K/uL   Basophils Relative 1 %   Basophils Absolute 0.1 0.0 - 0.1 K/uL   Immature Granulocytes 0 %   Abs Immature Granulocytes 0.03 0.00 - 0.07 K/uL    Comment: Performed at [facility], [place] [address], [place], [place] [address]  Basic metabolic panel     Status: Abnormal   Collection Time: 09/25/19  8:51 AM  Result Value Ref Range   Sodium 137 135 - 145 mmol/L   Potassium 3.0 (L) 3.5 - 5.1 mmol/L   Chloride 106 98 - 111 mmol/L   CO2 23 22 - 32 mmol/L   Glucose, Bld 91 70 - 99 mg/dL   BUN 10 6 - 20 mg/dL   Creatinine, Ser 0.49 (L) 0.61 - 1.24 mg/dL   Calcium 7.6 (L) 8.9 - 10.3 mg/dL   GFR calc non Af Amer >60 >60 mL/min   GFR calc Af Amer >60 >60 mL/min   Anion gap 8 5 - 15    Comment: Performed at [facility], [place] [address], [place], [place] [address]  Sedimentation rate     Status: Abnormal   Collection Time: 09/25/19  8:51 AM  Result Value Ref Range   Sed Rate 50 (H) 0 - 16 mm/hr    Comment: Performed at [facility], [place] [address] [place], [place] [address]  C-reactive protein     Status: Abnormal   Collection Time: 09/25/19  8:51 AM  Result Value Ref Range   CRP 2.7 (H) <1.0 mg/dL    Comment: Performed at [facility], [place] [address], [place], [place] [address]    DG Ribs Unilateral W/Chest Right  Result Date: 09/25/2019 CLINICAL DATA:  Recent fall several days ago with right-sided chest pain, initial encounter EXAM: RIGHT RIBS AND CHEST - 3+ VIEW COMPARISON:  None. FINDINGS: Cardiac shadow is  within normal limits. The lungs are well aerated bilaterally. Right basilar atelectatic changes are seen. No pneumothorax is noted. The known rib fractures  involving the first, second and third ribs on the right posteriorly are again seen and stable. Varying degrees of healing are noted. Undisplaced right tenth rib fracture anterior laterally is noted as well. IMPRESSION: Multiple right-sided rib fractures as described above. No pneumothorax is noted. Mild right basilar atelectasis is seen. Electronically Signed   By: Inez Catalina M.D.   On: 09/25/2019 09:45   DG Thoracic Spine 2 View  Result Date: 09/25/2019 CLINICAL DATA:  Recent fall with upper back pain, initial encounter EXAM: THORACIC SPINE 2 VIEWS COMPARISON:  None. FINDINGS: Vertebral body height is well maintained. Mild osteophytic changes are noted within the cervical spine. No paraspinal mass lesion is seen. The pedicles are within normal limits. Known right rib fractures are again visualized and stable. IMPRESSION: No acute abnormality in the thoracic spine. Electronically Signed   By: Inez Catalina M.D.   On: 09/25/2019 09:46   DG Lumbar Spine 2-3 Views  Result Date: 09/25/2019 CLINICAL DATA:  Fall several days ago with back pain, initial encounter EXAM: LUMBAR SPINE - 2-3 VIEW COMPARISON:  None. FINDINGS: Five lumbar type vertebral bodies are well visualized. Vertebral body height is well maintained. No anterolisthesis is noted. The right pedicle at L3 is incomplete inferiorly raising suspicion for underlying bony lesion. IMPRESSION: Irregularity of the right pedicle at L3. No other focal abnormality is noted. MRI of the lumbar spine with and without contrast material is recommended for further evaluation. Electronically Signed   By: Inez Catalina M.D.   On: 09/25/2019 09:50   DG Shoulder Right  Result Date: 09/25/2019 CLINICAL DATA:  Recent fall several days ago with right shoulder pain, initial encounter EXAM: RIGHT SHOULDER - 2+ VIEW  COMPARISON:  None. FINDINGS: Right clavicle and proximal right humerus are within normal limits. There are fractures of the first second and third ribs on the right with varying degrees of healing. No pneumothorax is noted. IMPRESSION: Multiple right rib fractures with varying degrees of healing. Electronically Signed   By: Inez Catalina M.D.   On: 09/25/2019 09:44   MR THORACIC SPINE W WO CONTRAST  Result Date: 09/25/2019 CLINICAL DATA:  Back pain. Abnormal radiographs. EXAM: MRI THORACIC AND LUMBAR SPINE WITHOUT AND WITH CONTRAST TECHNIQUE: Multiplanar and multiecho pulse sequences of the thoracic and lumbar spine were obtained without and with intravenous contrast. CONTRAST:  7.55mL GADAVIST GADOBUTROL 1 MMOL/ML IV SOLN COMPARISON:  Radiographs, same date. FINDINGS: MRI THORACIC SPINE FINDINGS Alignment:  Normal Vertebrae: Extensive metastatic disease involving the thoracic vertebral bodies and posterior elements. No obvious pathologic fracture. No spinal canal compromise. Cord:  No definite cord lesions or cord compression. Paraspinal and other soft tissues: Perivertebral tumor noted at T5 and T6. Multiple rib lesions are also noted. No obvious lung lesions or mediastinal tumor. Disc levels: No canal stenosis is demonstrated. No obvious foraminal lesions. Small left paracentral disc protrusion noted at T8-9 and small right paracentral disc protrusion at T9-10. MRI LUMBAR SPINE FINDINGS Segmentation: There are five lumbar type vertebral bodies. The last full intervertebral disc space is labeled L5-S1. Alignment:  Normal Vertebrae: Extensive metastatic bone disease with innumerable lesions throughout the lumbar spine and sacrum. No pathologic fracture or spinal canal stenosis. Conus medullaris: Extends to the T12-L1 level and appears normal. Paraspinal and other soft tissues: No significant paraspinal or retroperitoneal findings. No obvious renal or pancreatic lesions. No retroperitoneal mass or adenopathy. Disc  levels: Diffuse disc bulge at L3-4 with mild spinal and lateral recess stenosis. IMPRESSION: 1. Extensive metastatic bone  disease involving the thoracic and lumbar spine and sacrum. No pathologic fracture or spinal canal compromise. 2. Small thoracic disc protrusions but no significant canal or foraminal stenosis. 3. Diffuse disc bulge at L3-4 with mild spinal and lateral recess stenosis. 4. No obvious primary tumor is identified. Recommend metastatic workup with a CT scan of the chest, abdomen and pelvis with IV and oral contrast. Electronically Signed   By: Marijo Sanes M.D.   On: 09/25/2019 15:08   MR Lumbar Spine W Wo Contrast  Result Date: 09/25/2019 CLINICAL DATA:  Back pain. Abnormal radiographs. EXAM: MRI THORACIC AND LUMBAR SPINE WITHOUT AND WITH CONTRAST TECHNIQUE: Multiplanar and multiecho pulse sequences of the thoracic and lumbar spine were obtained without and with intravenous contrast. CONTRAST:  7.11mL GADAVIST GADOBUTROL 1 MMOL/ML IV SOLN COMPARISON:  Radiographs, same date. FINDINGS: MRI THORACIC SPINE FINDINGS Alignment:  Normal Vertebrae: Extensive metastatic disease involving the thoracic vertebral bodies and posterior elements. No obvious pathologic fracture. No spinal canal compromise. Cord:  No definite cord lesions or cord compression. Paraspinal and other soft tissues: Perivertebral tumor noted at T5 and T6. Multiple rib lesions are also noted. No obvious lung lesions or mediastinal tumor. Disc levels: No canal stenosis is demonstrated. No obvious foraminal lesions. Small left paracentral disc protrusion noted at T8-9 and small right paracentral disc protrusion at T9-10. MRI LUMBAR SPINE FINDINGS Segmentation: There are five lumbar type vertebral bodies. The last full intervertebral disc space is labeled L5-S1. Alignment:  Normal Vertebrae: Extensive metastatic bone disease with innumerable lesions throughout the lumbar spine and sacrum. No pathologic fracture or spinal canal stenosis.  Conus medullaris: Extends to the T12-L1 level and appears normal. Paraspinal and other soft tissues: No significant paraspinal or retroperitoneal findings. No obvious renal or pancreatic lesions. No retroperitoneal mass or adenopathy. Disc levels: Diffuse disc bulge at L3-4 with mild spinal and lateral recess stenosis. IMPRESSION: 1. 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 No pathologic fracture or spinal canal compromise. 2. Small thoracic disc protrusions but no significant canal or foraminal stenosis. 3. Diffuse disc bulge at L3-4 with mild spinal and lateral recess stenosis. 4. No obvious primary tumor is identified. Recommend metastatic workup with a CT scan of the chest, abdomen and pelvis with IV and oral contrast. Electronically Signed   By: Marijo Sanes M.D.   On: 09/25/2019 15:08   Impression/Plan   49 y.o. male with acute, progressive lower back pain secondary to widespread thoracic and lumbar spinal metastases, no known primary.  There is no pathologic fracture or spinal canal compromise. There is a disc bulge at L3-4 that results in mild lateral recess stenosis, but his pain is most attributable to widespread metastatic disease. He has what appears to be more pain mediated weakness with left HF than true focal weakness. No indication for neurosurgerical intervention.   He will be undergoing further work up to identify possible primary. Will need Onc consult and likely palliative radiation.   Please call for any concerns.  Ferne Reus, PA-C [facility] [employer]

## 2019-09-26 ENCOUNTER — Telehealth: Payer: Self-pay | Admitting: *Deleted

## 2019-09-26 NOTE — Telephone Encounter (Signed)
EDCM placed call to pt at request of EDP to set up PCP appointment.  There was no answer.  Will continue to reach out to pt.

## 2019-10-02 ENCOUNTER — Emergency Department (HOSPITAL_COMMUNITY): Payer: Medicaid Other

## 2019-10-02 ENCOUNTER — Other Ambulatory Visit: Payer: Self-pay

## 2019-10-02 ENCOUNTER — Emergency Department (HOSPITAL_COMMUNITY)
Admission: EM | Admit: 2019-10-02 | Discharge: 2019-10-03 | Disposition: A | Discharge status: Home / Self Care | Payer: Medicaid Other | Attending: Emergency Medicine | Admitting: Emergency Medicine

## 2019-10-02 DIAGNOSIS — R918 Other nonspecific abnormal finding of lung field: Secondary | ICD-10-CM

## 2019-10-02 DIAGNOSIS — D381 Neoplasm of uncertain behavior of trachea, bronchus and lung: Secondary | ICD-10-CM | POA: Diagnosis not present

## 2019-10-02 DIAGNOSIS — M549 Dorsalgia, unspecified: Secondary | ICD-10-CM

## 2019-10-02 DIAGNOSIS — C7951 Secondary malignant neoplasm of bone: Secondary | ICD-10-CM | POA: Diagnosis not present

## 2019-10-02 DIAGNOSIS — F17210 Nicotine dependence, cigarettes, uncomplicated: Secondary | ICD-10-CM | POA: Insufficient documentation

## 2019-10-02 LAB — CBC
HCT: 41.9 % (ref 39.0–52.0)
Hemoglobin: 13.8 g/dL (ref 13.0–17.0)
MCH: 28.5 pg (ref 26.0–34.0)
MCHC: 32.9 g/dL (ref 30.0–36.0)
MCV: 86.6 fL (ref 80.0–100.0)
Platelets: 223 10*3/uL (ref 150–400)
RBC: 4.84 MIL/uL (ref 4.22–5.81)
RDW: 13.5 % (ref 11.5–15.5)
WBC: 9.7 10*3/uL (ref 4.0–10.5)
nRBC: 0 % (ref 0.0–0.2)

## 2019-10-02 LAB — COMPREHENSIVE METABOLIC PANEL
ALT: 19 U/L (ref 0–44)
AST: 22 U/L (ref 15–41)
Albumin: 3.5 g/dL (ref 3.5–5.0)
Alkaline Phosphatase: 122 U/L (ref 38–126)
Anion gap: 8 (ref 5–15)
BUN: 6 mg/dL (ref 6–20)
CO2: 27 mmol/L (ref 22–32)
Calcium: 9.2 mg/dL (ref 8.9–10.3)
Chloride: 102 mmol/L (ref 98–111)
Creatinine, Ser: 0.56 mg/dL — ABNORMAL LOW (ref 0.61–1.24)
GFR calc Af Amer: >60 mL/min (ref >60)
GFR calc non Af Amer: >60 mL/min (ref >60)
Glucose, Bld: 97 mg/dL (ref 70–99)
Potassium: 3.9 mmol/L (ref 3.5–5.1)
Sodium: 137 mmol/L (ref 135–145)
Total Bilirubin: 0.6 mg/dL (ref 0.3–1.2)
Total Protein: 8.2 g/dL — ABNORMAL HIGH (ref 6.5–8.1)

## 2019-10-02 MED ORDER — IOHEXOL 9 MG/ML PO SOLN
ORAL | Status: AC
  Administered 2019-10-02: 23:00:00 500 mL via ORAL
  Filled 2019-10-02: qty 1000

## 2019-10-02 MED ORDER — FENTANYL CITRATE (PF) 100 MCG/2ML IJ SOLN
50.0000 ug | INTRAMUSCULAR | Status: DC | PRN
  Administered 2019-10-02: 22:00:00 50 ug via INTRAVENOUS
  Filled 2019-10-02: qty 2

## 2019-10-02 MED ORDER — IOHEXOL 9 MG/ML PO SOLN
500.0000 mL | ORAL | Status: AC
  Administered 2019-10-02: 23:00:00 500 mL via ORAL

## 2019-10-02 MED ORDER — IOHEXOL 9 MG/ML PO SOLN: 500.0000 mL | ORAL | Status: AC

## 2019-10-02 MED ORDER — HYDROMORPHONE HCL 1 MG/ML IJ SOLN
1.0000 mg | Freq: Once | INTRAMUSCULAR | Status: AC
  Administered 2019-10-02: 23:00:00 1 mg via INTRAVENOUS
  Filled 2019-10-02: qty 1

## 2019-10-02 MED ORDER — IOHEXOL 300 MG/ML  SOLN: 100.0000 mL | Freq: Once | INTRAMUSCULAR | Status: DC | PRN

## 2019-10-02 MED ORDER — ONDANSETRON HCL 4 MG/2ML IJ SOLN
4.0000 mg | Freq: Once | INTRAMUSCULAR | Status: AC
  Administered 2019-10-02: 23:00:00 4 mg via INTRAVENOUS
  Filled 2019-10-02: qty 2

## 2019-10-02 NOTE — ED Triage Notes (Signed)
Patient arrived with complaints of back pain due to cancer. States he was here last week and supposed to get CT scans done but left against medical advice, stating he came back to be admitted and continue his care.

## 2019-10-02 NOTE — ED Notes (Signed)
Extra blue and gold top x2 sent to lab

## 2019-10-02 NOTE — ED Provider Notes (Signed)
[facility] DEPT Provider Note   CSN: [id number] Arrival date & time: 10/02/19  1926     History Chief Complaint  Patient presents with  . Back Pain    Nicolas Welch is a 49 y.o. male.  Patient presents to the emergency department with a chief complaint of back pain.  He was seen recently for low back pain, which was thought to be secondary to a lifting injury.  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  He was encouraged to undergo CT imaging for possible diagnosis of primary cancer.  Ultimately, the patient left AMA, but returns tonight to complete his work-up.  States that he has not really had any significant changes in his symptoms.  He continues to have back pain.  He reports left lower extremity weakness, but attributes this to pain.  Neurosurgery was consulted during his most recent emergency department evaluation, and it was deemed that there was no need for neurosurgical intervention at that time.  Patient has not followed up with anyone for this.  He denies any fevers or chills.  He states that he has had night sweats, but has never measured temperature.  He denies any bowel or bladder incontinence.  States that he has been slightly constipated.  The history is provided by the patient. No language interpreter was used.       Past Medical History:  Diagnosis Date  . Anginal pain ([place])     There are no problems to display for this patient.   No past surgical history on file.     No family history on file.  Social History   Tobacco Use  . Smoking status: Current Every Day Smoker    Packs/day: 1.00    Types: Cigarettes  . Smokeless tobacco: Never Used  Substance Use Topics  . Alcohol use: No  . Drug use: Yes    Types: Cocaine, IV    Comment: heroin    Home Medications Prior to Admission medications   Medication Sig Start Date End Date Taking? Authorizing Provider  ibuprofen (ADVIL) 200  MG tablet Take 200 mg by mouth every 6 (six) hours as needed for moderate pain.   Yes [provider]    Allergies    Patient has no known allergies.  Review of Systems   Review of Systems  All other systems reviewed and are negative.   Physical Exam Updated Vital Signs BP 112/77 (BP Location: Left Arm)   Pulse (!) 120   Temp 98.6 F (37 C) (Oral)   Resp 18   Ht 6' (1.829 m)   Wt 70.3 kg   SpO2 98%   BMI 21.02 kg/m   Physical Exam Vitals and nursing note reviewed.  Constitutional:      Appearance: He is well-developed.  HENT:     Head: Normocephalic and atraumatic.  Eyes:     Conjunctiva/sclera: Conjunctivae normal.  Cardiovascular:     Rate and Rhythm: Normal rate and regular rhythm.     Heart sounds: No murmur.  Pulmonary:     Effort: Pulmonary effort is normal. No respiratory distress.     Breath sounds: Normal breath sounds.     Comments: Lungs are clear to auscultation Abdominal:     Palpations: Abdomen is soft.     Tenderness: There is no abdominal tenderness.  Musculoskeletal:        General: Normal range of motion.     Cervical back: Neck supple.  Comments: Decreased strength in left lower extremity at the hip, thought secondary to pain, normal range of motion and strength of the knee and ankle  Right lower extremity range of motion strength is normal  Skin:    General: Skin is warm and dry.  Neurological:     Mental Status: He is alert and oriented to person, place, and time.  Psychiatric:        Mood and Affect: Mood normal.        Behavior: Behavior normal.     ED Results / Procedures / Treatments   Labs (all labs ordered are listed, but only abnormal results are displayed) Labs Reviewed  COMPREHENSIVE METABOLIC PANEL - Abnormal; Notable for the following components:      Result Value   Creatinine, Ser 0.56 (*)    Total Protein 8.2 (*)    All other components within normal limits  CBC    EKG None  Radiology CT Chest W  Contrast  Result Date: 10/03/2019 CLINICAL DATA:  Metastatic bone disease seen on prior MRI. Evaluate for primary EXAM: CT CHEST, ABDOMEN, AND PELVIS WITH CONTRAST TECHNIQUE: Multidetector CT imaging of the chest, abdomen and pelvis was performed following the standard protocol during bolus administration of intravenous contrast. CONTRAST:  162mL OMNIPAQUE IOHEXOL 300 MG/ML  SOLN COMPARISON:  MRI thoracic spine and lumbar spine 09/25/2019 FINDINGS: CT CHEST FINDINGS Cardiovascular: Heart is normal size. Aorta is normal caliber. Scattered aortic and coronary artery calcifications. Mediastinum/Nodes: Necrotic mediastinal and right hilar adenoma. Right paratracheal lymph node has a short axis diameter of 16 mm. Necrotic subcarinal adenopathy has a short axis diameter of 19 mm. Necrotic right hilar lymph nodes have a short axis diameter of 15 mm. No axillary or left hilar adenopathy. Lungs/Pleura: Central necrotic appearing right lower lobe mass. This measures approximately 3.8 x 3.3 cm. Peripheral postobstructive atelectasis or pneumonia in the right lower lobe. No effusions. Musculoskeletal: Multiple bilateral lytic expansile rib lesions, the largest in the posterior left 8th rib measuring up to 4.2 cm on image 33. Expansile lytic lesions noted in the anterior right 1st and 3rd ribs. Lytic lesions noted within the T11 in the T5 vertebral bodies. CT ABDOMEN PELVIS FINDINGS Hepatobiliary: 8 mm low-density lesion in the inferior right hepatic lobe, nonspecific, too small to characterize. Gallbladder contracted. Pancreas: No focal abnormality or ductal dilatation. Spleen: 5 mm low-density lesion within the superior pole of the spleen, nonspecific and too small to characterize. Adrenals/Urinary Tract: No adrenal abnormality. No focal renal abnormality. No stones or hydronephrosis. Urinary bladder is unremarkable. Stomach/Bowel: Stomach, large and small bowel grossly unremarkable. Vascular/Lymphatic: Aortic  atherosclerosis. No aneurysm. Prominent porta hepatis lymph nodes. Portacaval lymph node has a short axis diameter of 12 mm on image 59. Reproductive: No visible focal abnormality. Other: No free fluid or free air. Musculoskeletal: Extensive lytic lesions seen throughout the lumbar spine, sacrum, right pubic bone and inferior pubic ramus and right iliac crest. IMPRESSION: Central right lower lobe necrotic appearing pulmonary mass measuring up to 3.8 cm. Findings concerning for lung cancer. Associated necrotic appearing right hilar and mediastinal adenopathy. Lytic destructive lesions within the ribs, thoracic and lumbar spine, and pelvis compatible with bony metastases. Small low-density lesions within the liver and spleen which are too small to characterize. Recommend attention on subsequent imaging. Mildly enlarged right upper quadrant/porta hepatis lymph nodes. Aortic atherosclerosis. Electronically Signed   By: Rolm Baptise M.D.   On: 10/03/2019 01:37   CT ABDOMEN PELVIS W CONTRAST  Result Date:  10/03/2019 CLINICAL DATA:  Metastatic bone disease seen on prior MRI. Evaluate for primary EXAM: CT CHEST, ABDOMEN, AND PELVIS WITH CONTRAST TECHNIQUE: Multidetector CT imaging of the chest, abdomen and pelvis was performed following the standard protocol during bolus administration of intravenous contrast. CONTRAST:  160mL OMNIPAQUE IOHEXOL 300 MG/ML  SOLN COMPARISON:  MRI thoracic spine and lumbar spine 09/25/2019 FINDINGS: CT CHEST FINDINGS Cardiovascular: Heart is normal size. Aorta is normal caliber. Scattered aortic and coronary artery calcifications. Mediastinum/Nodes: Necrotic mediastinal and right hilar adenoma. Right paratracheal lymph node has a short axis diameter of 16 mm. Necrotic subcarinal adenopathy has a short axis diameter of 19 mm. Necrotic right hilar lymph nodes have a short axis diameter of 15 mm. No axillary or left hilar adenopathy. Lungs/Pleura: Central necrotic appearing right lower lobe  mass. This measures approximately 3.8 x 3.3 cm. Peripheral postobstructive atelectasis or pneumonia in the right lower lobe. No effusions. Musculoskeletal: Multiple bilateral lytic expansile rib lesions, the largest in the posterior left 8th rib measuring up to 4.2 cm on image 33. Expansile lytic lesions noted in the anterior right 1st and 3rd ribs. Lytic lesions noted within the T11 in the T5 vertebral bodies. CT ABDOMEN PELVIS FINDINGS Hepatobiliary: 8 mm low-density lesion in the inferior right hepatic lobe, nonspecific, too small to characterize. Gallbladder contracted. Pancreas: No focal abnormality or ductal dilatation. Spleen: 5 mm low-density lesion within the superior pole of the spleen, nonspecific and too small to characterize. Adrenals/Urinary Tract: No adrenal abnormality. No focal renal abnormality. No stones or hydronephrosis. Urinary bladder is unremarkable. Stomach/Bowel: Stomach, large and small bowel grossly unremarkable. Vascular/Lymphatic: Aortic atherosclerosis. No aneurysm. Prominent porta hepatis lymph nodes. Portacaval lymph node has a short axis diameter of 12 mm on image 59. Reproductive: No visible focal abnormality. Other: No free fluid or free air. Musculoskeletal: Extensive lytic lesions seen throughout the lumbar spine, sacrum, right pubic bone and inferior pubic ramus and right iliac crest. IMPRESSION: Central right lower lobe necrotic appearing pulmonary mass measuring up to 3.8 cm. Findings concerning for lung cancer. Associated necrotic appearing right hilar and mediastinal adenopathy. Lytic destructive lesions within the ribs, thoracic and lumbar spine, and pelvis compatible with bony metastases. Small low-density lesions within the liver and spleen which are too small to characterize. Recommend attention on subsequent imaging. Mildly enlarged right upper quadrant/porta hepatis lymph nodes. Aortic atherosclerosis. Electronically Signed   By: Rolm Baptise M.D.   On: 10/03/2019  01:37   DG Femur 1 View Left  Result Date: 10/02/2019 CLINICAL DATA:  Evaluate for bony metastases EXAM: LEFT FEMUR 1 VIEW COMPARISON:  None. FINDINGS: There is no evidence of fracture or other focal bone lesions. Soft tissues are unremarkable. IMPRESSION: Negative. Electronically Signed   By: Rolm Baptise M.D.   On: 10/02/2019 23:07    Procedures Procedures (including critical care time)  Medications Ordered in ED Medications  fentaNYL (SUBLIMAZE) injection 50 mcg (50 mcg Intravenous Given 10/02/19 2133)  HYDROmorphone (DILAUDID) injection 1 mg (has no administration in time range)  ondansetron (ZOFRAN) injection 4 mg (has no administration in time range)    ED Course  I have reviewed the triage vital signs and the nursing notes.  Pertinent labs & imaging results that were available during my care of the patient were reviewed by me and considered in my medical decision making (see chart for details).    MDM Rules/Calculators/A&P  Patient with recently discovered metastatic disease to his spine.  Unknown primary Cx.  Will check CT imaging per radiology recommendations.  Anticipate consultation with oncology.  CT scan shows findings concerning for primary lung cancer with bony metastases as above.  I discussed the CT findings with the patient.  I have placed ambulatory referrals to pulmonology and to oncology as well as sent epic message shows in the hopes that patient can be seen soon for follow-up.  I have also consulted social work to ensure that follow-up is achieved.  Patient prescribed oxycodone and Flexeril.     Final Clinical Impression(s) / ED Diagnoses Final diagnoses:  Back pain, unspecified back location, unspecified back pain laterality, unspecified chronicity  Bone metastases ([place])  Lung mass    Rx / DC Orders ED Discharge Orders         Ordered    Ambulatory referral to Pulmonology     10/03/19 0213    Ambulatory referral to Oncology      10/03/19 0213    oxyCODONE-acetaminophen (PERCOCET) 10-325 MG tablet  Every 6 hours PRN     10/03/19 0221    cyclobenzaprine (FLEXERIL) 10 MG tablet  2 times daily PRN     10/03/19 0221           Montine Circle, PA-C 10/03/19 0224    Charlesetta Shanks, MD 10/05/19 [phone]

## 2019-10-03 ENCOUNTER — Emergency Department (HOSPITAL_COMMUNITY): Payer: Medicaid Other

## 2019-10-03 ENCOUNTER — Encounter (HOSPITAL_COMMUNITY): Payer: Self-pay

## 2019-10-03 ENCOUNTER — Telehealth: Payer: Self-pay | Admitting: Surgery

## 2019-10-03 MED ORDER — IOHEXOL 300 MG/ML  SOLN
100.0000 mL | Freq: Once | INTRAMUSCULAR | Status: AC | PRN
  Administered 2019-10-03: 01:00:00 100 mL via INTRAVENOUS

## 2019-10-03 MED ORDER — OXYCODONE-ACETAMINOPHEN 10-325 MG PO TABS: 1.0000 | ORAL_TABLET | Freq: Four times a day (QID) | ORAL | 0 refills | Status: DC | PRN

## 2019-10-03 MED ORDER — OXYCODONE-ACETAMINOPHEN 5-325 MG PO TABS
2.0000 | ORAL_TABLET | Freq: Once | ORAL | Status: AC
  Administered 2019-10-03: 03:00:00 2 via ORAL
  Filled 2019-10-03: qty 2

## 2019-10-03 MED ORDER — SODIUM CHLORIDE (PF) 0.9 % IJ SOLN
INTRAMUSCULAR | Status: AC
  Filled 2019-10-03: qty 50

## 2019-10-03 MED ORDER — CYCLOBENZAPRINE HCL 10 MG PO TABS: 10.0000 mg | ORAL_TABLET | Freq: Two times a day (BID) | ORAL | 0 refills | Status: DC | PRN

## 2019-10-03 MED ORDER — METHOCARBAMOL 1000 MG/10ML IJ SOLN: 500.0000 mg | Freq: Once | INTRAMUSCULAR | Status: DC

## 2019-10-03 MED ORDER — METHOCARBAMOL 1000 MG/10ML IJ SOLN
500.0000 mg | Freq: Once | INTRAVENOUS | Status: AC
  Administered 2019-10-03: 02:00:00 500 mg via INTRAVENOUS
  Filled 2019-10-03: qty 500

## 2019-10-03 NOTE — Discharge Instructions (Signed)
Your CT scan shows findings concerning for lung cancer with metastases to your bones.  You will need to be seen by a pulmonologist and oncologist.  I have sent messages to their offices as well as to our social workers to help you get appointments.  If you don't hear from them in the next few days, you should contact their offices at the numbers listed above.  Take pain medication as prescribed.

## 2019-10-03 NOTE — Telephone Encounter (Signed)
ED CM sent referral to the [facility] for assistance with facilitating this. ED CM will follow up.

## 2019-10-03 NOTE — Progress Notes (Signed)
TOC Referral: arrange appt for Oncology and Pulm  10/03/2019 12:55 pm TOC CM contacted pt to get permission to arrange his appt for Pulm. He has appt with Oncology on 10/05/2019. Pt states anytime will work. TOC CM contacted [facility] and appt on 10/10/2019 at 2:15 pm with Dr Lamonte Sakai. Office needs referral updated with diagnosis. Referral EDP updated and request to add diagnosis to ambulatory referral. Contacted pt with update. [place], [place] ED TOC CM [phone]

## 2019-10-05 ENCOUNTER — Inpatient Hospital Stay: Payer: Medicaid Other

## 2019-10-05 ENCOUNTER — Inpatient Hospital Stay: Payer: Medicaid Other | Attending: Internal Medicine | Admitting: Internal Medicine

## 2019-10-05 ENCOUNTER — Other Ambulatory Visit: Payer: Self-pay

## 2019-10-05 ENCOUNTER — Encounter: Payer: Self-pay | Admitting: Internal Medicine

## 2019-10-05 DIAGNOSIS — C3490 Malignant neoplasm of unspecified part of unspecified bronchus or lung: Secondary | ICD-10-CM

## 2019-10-05 DIAGNOSIS — C3431 Malignant neoplasm of lower lobe, right bronchus or lung: Secondary | ICD-10-CM

## 2019-10-05 DIAGNOSIS — Z720 Tobacco use: Secondary | ICD-10-CM

## 2019-10-05 DIAGNOSIS — G893 Neoplasm related pain (acute) (chronic): Secondary | ICD-10-CM

## 2019-10-05 DIAGNOSIS — F1110 Opioid abuse, uncomplicated: Secondary | ICD-10-CM | POA: Insufficient documentation

## 2019-10-05 DIAGNOSIS — C3491 Malignant neoplasm of unspecified part of right bronchus or lung: Secondary | ICD-10-CM

## 2019-10-05 DIAGNOSIS — C7951 Secondary malignant neoplasm of bone: Secondary | ICD-10-CM | POA: Insufficient documentation

## 2019-10-05 DIAGNOSIS — Z7189 Other specified counseling: Secondary | ICD-10-CM | POA: Insufficient documentation

## 2019-10-05 MED ORDER — FENTANYL 50 MCG/HR TD PT72: 1.0000 | MEDICATED_PATCH | TRANSDERMAL | 0 refills | Status: DC

## 2019-10-05 MED ORDER — DEXAMETHASONE 4 MG PO TABS: 4.0000 mg | ORAL_TABLET | Freq: Three times a day (TID) | ORAL | 0 refills | Status: DC

## 2019-10-05 NOTE — Progress Notes (Signed)
[place] [facility] [phone]   [phone]  CONSULT NOTE  REFERRING PHYSICIAN: Dr. Lacretia Leigh  REASON FOR CONSULTATION:  49 years old white male with highly suspicious metastatic lung cancer.  HPI Nicolas Welch is a 49 y.o. male with past medical history significant for hypertension, celiac disease, angina pectoris, hepatitis C as well as long history of smoking and heroin abuse.  The patient mentions that for 2 months she has been complaining of pain in the left side of the chest as well as left hip.  He initially thought that he pulled a muscle and has been using his heroin for management of his pain.  His pain was getting worse and the patient was seen by Dr. Madalyn Rob.  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  There was no pathologic fracture or spinal canal compromise.  There was a small thoracic disc protrusion but no significant canal or foraminal stenosis.  The patient was seen at the emergency department and had CT scan of the chest, abdomen pelvis performed on October 03, 2019 and it showed central right lower lobe necrotic appearing pulmonary mass measuring up to 3.8 cm concerning for lung cancer.  There was associated necrotic appearing right hilar and mediastinal adenopathy.  There was also lytic destructive lesions within the ribs, thoracic and lumbar spines as well as pelvis compatible with bony metastasis.  There was small low-density lesions within the liver and spleen which are too small to characterize.  There was also mildly enlarged right upper quadrant/porta hepatis lymph nodes. The patient was referred to me today for evaluation and recommendation regarding his condition. When seen today the patient continues to complain of pain all over his body mainly on the left arm as well as left hip and  back.  He has been using heroin as well as Percocet with mild improvement of his pain.  He has shortness of breath as well as a smoker cough with no hemoptysis.  He lost around 20 pounds over the last 3 weeks.  He also has nausea and constipation but no significant abdominal pain.  He has no headache or visual changes. Family history significant for father died from heart disease at age 44.  Mother had metastatic cancer likely of his stomach origin.  His brother has multiple sclerosis. The patient is married and has no children.  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  He was accompanied today by his wife Nicolas Welch.  The patient has a history for smoking 1 pack/day for around 30 years and unfortunately continues to smoke.  He has no history of alcohol abuse but has a history of heroin abuse and he unfortunately continues to do it.  He also smokes marijuana.   Past Medical History:  Diagnosis Date   Anginal pain ([place])     No past surgical history on file.  No family history on file.  Social History Social History   Tobacco Use   Smoking status: Current Every Day Smoker    Packs/day: 1.00    Types: Cigarettes   Smokeless tobacco: Never Used  Substance Use Topics   Alcohol use: No   Drug use: Yes    Types: Cocaine, IV    Comment: heroin    No Known Allergies  Current Outpatient Medications  Medication Sig Dispense Refill  cyclobenzaprine (FLEXERIL) 10 MG tablet Take 1 tablet (10 mg total) by mouth 2 (two) times daily as needed for muscle spasms. 20 tablet 0   ibuprofen (ADVIL) 200 MG tablet Take 200 mg by mouth every 6 (six) hours as needed for moderate pain.     oxyCODONE-acetaminophen (PERCOCET) 10-325 MG tablet Take 1 tablet by mouth every 6 (six) hours as needed for pain. 20 tablet 0   No current facility-administered medications for this visit.    Review of Systems  Constitutional: positive for fatigue and weight loss Eyes: negative Ears, nose, mouth, throat, and  face: negative Respiratory: positive for cough, dyspnea on exertion and pleurisy/chest pain Cardiovascular: negative Gastrointestinal: positive for constipation and nausea Genitourinary:negative Integument/breast: negative Hematologic/lymphatic: negative Musculoskeletal:positive for back pain and bone pain Neurological: negative Behavioral/Psych: negative Endocrine: negative Allergic/Immunologic: negative  Physical Exam  IRJ:JOACZ, healthy, no distress, well nourished, well developed and anxious SKIN: skin color, texture, turgor are normal, no rashes or significant lesions HEAD: Normocephalic, No masses, lesions, tenderness or abnormalities EYES: normal, PERRLA, Conjunctiva are pink and non-injected EARS: External ears normal, Canals clear OROPHARYNX:no exudate, no erythema and lips, buccal mucosa, and tongue normal  NECK: supple, no adenopathy, no JVD LYMPH:  no palpable lymphadenopathy, no hepatosplenomegaly LUNGS: clear to auscultation , and palpation HEART: regular rate & rhythm, no murmurs and no gallops ABDOMEN:abdomen soft, non-tender, normal bowel sounds and no masses or organomegaly BACK: Back symmetric, no curvature., No CVA tenderness EXTREMITIES:no joint deformities, effusion, or inflammation, no edema  NEURO: alert & oriented x 3 with fluent speech, no focal motor/sensory deficits  PERFORMANCE STATUS: ECOG 1  LABORATORY DATA: Lab Results  Component Value Date   WBC 9.7 10/02/2019   HGB 13.8 10/02/2019   HCT 41.9 10/02/2019   MCV 86.6 10/02/2019   PLT 223 10/02/2019      Chemistry      Component Value Date/Time   NA 137 10/02/2019 2130   K 3.9 10/02/2019 2130   CL 102 10/02/2019 2130   CO2 27 10/02/2019 2130   BUN 6 10/02/2019 2130   CREATININE 0.56 (L) 10/02/2019 2130      Component Value Date/Time   CALCIUM 9.2 10/02/2019 2130   ALKPHOS 122 10/02/2019 2130   AST 22 10/02/2019 2130   ALT 19 10/02/2019 2130   BILITOT 0.6 10/02/2019 2130        RADIOGRAPHIC STUDIES: DG Ribs Unilateral W/Chest Right  Result Date: 09/25/2019 CLINICAL DATA:  Recent fall several days ago with right-sided chest pain, initial encounter EXAM: RIGHT RIBS AND CHEST - 3+ VIEW COMPARISON:  None. FINDINGS: Cardiac shadow is within normal limits. The lungs are well aerated bilaterally. Right basilar atelectatic changes are seen. No pneumothorax is noted. The known rib fractures involving the first, second and third ribs on the right posteriorly are again seen and stable. Varying degrees of healing are noted. Undisplaced right tenth rib fracture anterior laterally is noted as well. IMPRESSION: Multiple right-sided rib fractures as described above. No pneumothorax is noted. Mild right basilar atelectasis is seen. Electronically Signed   By: Inez Catalina M.D.   On: 09/25/2019 09:45   DG Thoracic Spine 2 View  Result Date: 09/25/2019 CLINICAL DATA:  Recent fall with upper back pain, initial encounter EXAM: THORACIC SPINE 2 VIEWS COMPARISON:  None. FINDINGS: Vertebral body height is well maintained. Mild osteophytic changes are noted within the cervical spine. No paraspinal mass lesion is seen. The pedicles are within normal limits. Known right rib fractures are again  visualized and stable. IMPRESSION: No acute abnormality in the thoracic spine. Electronically Signed   By: Inez Catalina M.D.   On: 09/25/2019 09:46   DG Lumbar Spine 2-3 Views  Result Date: 09/25/2019 CLINICAL DATA:  Fall several days ago with back pain, initial encounter EXAM: LUMBAR SPINE - 2-3 VIEW COMPARISON:  None. FINDINGS: Five lumbar type vertebral bodies are well visualized. Vertebral body height is well maintained. No anterolisthesis is noted. The right pedicle at L3 is incomplete inferiorly raising suspicion for underlying bony lesion. IMPRESSION: Irregularity of the right pedicle at L3. No other focal abnormality is noted. MRI of the lumbar spine with and without contrast material is recommended  for further evaluation. Electronically Signed   By: Inez Catalina M.D.   On: 09/25/2019 09:50   DG Shoulder Right  Result Date: 09/25/2019 CLINICAL DATA:  Recent fall several days ago with right shoulder pain, initial encounter EXAM: RIGHT SHOULDER - 2+ VIEW COMPARISON:  None. FINDINGS: Right clavicle and proximal right humerus are within normal limits. There are fractures of the first second and third ribs on the right with varying degrees of healing. No pneumothorax is noted. IMPRESSION: Multiple right rib fractures with varying degrees of healing. Electronically Signed   By: Inez Catalina M.D.   On: 09/25/2019 09:44   CT Chest W Contrast  Result Date: 10/03/2019 CLINICAL DATA:  Metastatic bone disease seen on prior MRI. Evaluate for primary EXAM: CT CHEST, ABDOMEN, AND PELVIS WITH CONTRAST TECHNIQUE: Multidetector CT imaging of the chest, abdomen and pelvis was performed following the standard protocol during bolus administration of intravenous contrast. CONTRAST:  187mL OMNIPAQUE IOHEXOL 300 MG/ML  SOLN COMPARISON:  MRI thoracic spine and lumbar spine 09/25/2019 FINDINGS: CT CHEST FINDINGS Cardiovascular: Heart is normal size. Aorta is normal caliber. Scattered aortic and coronary artery calcifications. Mediastinum/Nodes: Necrotic mediastinal and right hilar adenoma. Right paratracheal lymph node has a short axis diameter of 16 mm. Necrotic subcarinal adenopathy has a short axis diameter of 19 mm. Necrotic right hilar lymph nodes have a short axis diameter of 15 mm. No axillary or left hilar adenopathy. Lungs/Pleura: Central necrotic appearing right lower lobe mass. This measures approximately 3.8 x 3.3 cm. Peripheral postobstructive atelectasis or pneumonia in the right lower lobe. No effusions. Musculoskeletal: Multiple bilateral lytic expansile rib lesions, the largest in the posterior left 8th rib measuring up to 4.2 cm on image 33. Expansile lytic lesions noted in the anterior right 1st and 3rd ribs.  Lytic lesions noted within the T11 in the T5 vertebral bodies. CT ABDOMEN PELVIS FINDINGS Hepatobiliary: 8 mm low-density lesion in the inferior right hepatic lobe, nonspecific, too small to characterize. Gallbladder contracted. Pancreas: No focal abnormality or ductal dilatation. Spleen: 5 mm low-density lesion within the superior pole of the spleen, nonspecific and too small to characterize. Adrenals/Urinary Tract: No adrenal abnormality. No focal renal abnormality. No stones or hydronephrosis. Urinary bladder is unremarkable. Stomach/Bowel: Stomach, large and small bowel grossly unremarkable. Vascular/Lymphatic: Aortic atherosclerosis. No aneurysm. Prominent porta hepatis lymph nodes. Portacaval lymph node has a short axis diameter of 12 mm on image 59. Reproductive: No visible focal abnormality. Other: No Welch fluid or Welch air. Musculoskeletal: Extensive lytic lesions seen throughout the lumbar spine, sacrum, right pubic bone and inferior pubic ramus and right iliac crest. IMPRESSION: Central right lower lobe necrotic appearing pulmonary mass measuring up to 3.8 cm. Findings concerning for lung cancer. Associated necrotic appearing right hilar and mediastinal adenopathy. Lytic destructive lesions within the ribs, thoracic and  lumbar spine, and pelvis compatible with bony metastases. Small low-density lesions within the liver and spleen which are too small to characterize. Recommend attention on subsequent imaging. Mildly enlarged right upper quadrant/porta hepatis lymph nodes. Aortic atherosclerosis. Electronically Signed   By: Rolm Baptise M.D.   On: 10/03/2019 01:37   MR THORACIC SPINE W WO CONTRAST  Result Date: 09/25/2019 CLINICAL DATA:  Back pain. Abnormal radiographs. EXAM: MRI THORACIC AND LUMBAR SPINE WITHOUT AND WITH CONTRAST TECHNIQUE: Multiplanar and multiecho pulse sequences of the thoracic and lumbar spine were obtained without and with intravenous contrast. CONTRAST:  7.83mL GADAVIST GADOBUTROL  1 MMOL/ML IV SOLN COMPARISON:  Radiographs, same date. FINDINGS: MRI THORACIC SPINE FINDINGS Alignment:  Normal Vertebrae: Extensive metastatic disease involving the thoracic vertebral bodies and posterior elements. No obvious pathologic fracture. No spinal canal compromise. Cord:  No definite cord lesions or cord compression. Paraspinal and other soft tissues: Perivertebral tumor noted at T5 and T6. Multiple rib lesions are also noted. No obvious lung lesions or mediastinal tumor. Disc levels: No canal stenosis is demonstrated. No obvious foraminal lesions. Small left paracentral disc protrusion noted at T8-9 and small right paracentral disc protrusion at T9-10. MRI LUMBAR SPINE FINDINGS Segmentation: There are five lumbar type vertebral bodies. The last full intervertebral disc space is labeled L5-S1. Alignment:  Normal Vertebrae: Extensive metastatic bone disease with innumerable lesions throughout the lumbar spine and sacrum. No pathologic fracture or spinal canal stenosis. Conus medullaris: Extends to the T12-L1 level and appears normal. Paraspinal and other soft tissues: No significant paraspinal or retroperitoneal findings. No obvious renal or pancreatic lesions. No retroperitoneal mass or adenopathy. Disc levels: Diffuse disc bulge at L3-4 with mild spinal and lateral recess stenosis. IMPRESSION: 1. 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 No pathologic fracture or spinal canal compromise. 2. Small thoracic disc protrusions but no significant canal or foraminal stenosis. 3. Diffuse disc bulge at L3-4 with mild spinal and lateral recess stenosis. 4. No obvious primary tumor is identified. Recommend metastatic workup with a CT scan of the chest, abdomen and pelvis with IV and oral contrast. Electronically Signed   By: Marijo Sanes M.D.   On: 09/25/2019 15:08   MR Lumbar Spine W Wo Contrast  Result Date: 09/25/2019 CLINICAL DATA:  Back pain. Abnormal  radiographs. EXAM: MRI THORACIC AND LUMBAR SPINE WITHOUT AND WITH CONTRAST TECHNIQUE: Multiplanar and multiecho pulse sequences of the thoracic and lumbar spine were obtained without and with intravenous contrast. CONTRAST:  7.28mL GADAVIST GADOBUTROL 1 MMOL/ML IV SOLN COMPARISON:  Radiographs, same date. FINDINGS: MRI THORACIC SPINE FINDINGS Alignment:  Normal Vertebrae: Extensive metastatic disease involving the thoracic vertebral bodies and posterior elements. No obvious pathologic fracture. No spinal canal compromise. Cord:  No definite cord lesions or cord compression. Paraspinal and other soft tissues: Perivertebral tumor noted at T5 and T6. Multiple rib lesions are also noted. No obvious lung lesions or mediastinal tumor. Disc levels: No canal stenosis is demonstrated. No obvious foraminal lesions. Small left paracentral disc protrusion noted at T8-9 and small right paracentral disc protrusion at T9-10. MRI LUMBAR SPINE FINDINGS Segmentation: There are five lumbar type vertebral bodies. The last full intervertebral disc space is labeled L5-S1. Alignment:  Normal Vertebrae: Extensive metastatic bone disease with innumerable lesions throughout the lumbar spine and sacrum. No pathologic fracture or spinal canal stenosis. Conus medullaris: Extends to the T12-L1 level and appears normal. Paraspinal and other soft tissues: No significant paraspinal or retroperitoneal findings. No obvious renal  or pancreatic lesions. No retroperitoneal mass or adenopathy. Disc levels: Diffuse disc bulge at L3-4 with mild spinal and lateral recess stenosis. IMPRESSION: 1. 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 No pathologic fracture or spinal canal compromise. 2. Small thoracic disc protrusions but no significant canal or foraminal stenosis. 3. Diffuse disc bulge at L3-4 with mild spinal and lateral recess stenosis. 4. No obvious primary tumor is identified. Recommend metastatic workup  with a CT scan of the chest, abdomen and pelvis with IV and oral contrast. Electronically Signed   By: Marijo Sanes M.D.   On: 09/25/2019 15:08   CT ABDOMEN PELVIS W CONTRAST  Result Date: 10/03/2019 CLINICAL DATA:  Metastatic bone disease seen on prior MRI. Evaluate for primary EXAM: CT CHEST, ABDOMEN, AND PELVIS WITH CONTRAST TECHNIQUE: Multidetector CT imaging of the chest, abdomen and pelvis was performed following the standard protocol during bolus administration of intravenous contrast. CONTRAST:  136mL OMNIPAQUE IOHEXOL 300 MG/ML  SOLN COMPARISON:  MRI thoracic spine and lumbar spine 09/25/2019 FINDINGS: CT CHEST FINDINGS Cardiovascular: Heart is normal size. Aorta is normal caliber. Scattered aortic and coronary artery calcifications. Mediastinum/Nodes: Necrotic mediastinal and right hilar adenoma. Right paratracheal lymph node has a short axis diameter of 16 mm. Necrotic subcarinal adenopathy has a short axis diameter of 19 mm. Necrotic right hilar lymph nodes have a short axis diameter of 15 mm. No axillary or left hilar adenopathy. Lungs/Pleura: Central necrotic appearing right lower lobe mass. This measures approximately 3.8 x 3.3 cm. Peripheral postobstructive atelectasis or pneumonia in the right lower lobe. No effusions. Musculoskeletal: Multiple bilateral lytic expansile rib lesions, the largest in the posterior left 8th rib measuring up to 4.2 cm on image 33. Expansile lytic lesions noted in the anterior right 1st and 3rd ribs. Lytic lesions noted within the T11 in the T5 vertebral bodies. CT ABDOMEN PELVIS FINDINGS Hepatobiliary: 8 mm low-density lesion in the inferior right hepatic lobe, nonspecific, too small to characterize. Gallbladder contracted. Pancreas: No focal abnormality or ductal dilatation. Spleen: 5 mm low-density lesion within the superior pole of the spleen, nonspecific and too small to characterize. Adrenals/Urinary Tract: No adrenal abnormality. No focal renal abnormality. No  stones or hydronephrosis. Urinary bladder is unremarkable. Stomach/Bowel: Stomach, large and small bowel grossly unremarkable. Vascular/Lymphatic: Aortic atherosclerosis. No aneurysm. Prominent porta hepatis lymph nodes. Portacaval lymph node has a short axis diameter of 12 mm on image 59. Reproductive: No visible focal abnormality. Other: No Welch fluid or Welch air. Musculoskeletal: Extensive lytic lesions seen throughout the lumbar spine, sacrum, right pubic bone and inferior pubic ramus and right iliac crest. IMPRESSION: Central right lower lobe necrotic appearing pulmonary mass measuring up to 3.8 cm. Findings concerning for lung cancer. Associated necrotic appearing right hilar and mediastinal adenopathy. Lytic destructive lesions within the ribs, thoracic and lumbar spine, and pelvis compatible with bony metastases. Small low-density lesions within the liver and spleen which are too small to characterize. Recommend attention on subsequent imaging. Mildly enlarged right upper quadrant/porta hepatis lymph nodes. Aortic atherosclerosis. Electronically Signed   By: Rolm Baptise M.D.   On: 10/03/2019 01:37   DG Femur 1 View Left  Result Date: 10/02/2019 CLINICAL DATA:  Evaluate for bony metastases EXAM: LEFT FEMUR 1 VIEW COMPARISON:  None. FINDINGS: There is no evidence of fracture or other focal bone lesions. Soft tissues are unremarkable. IMPRESSION: Negative. Electronically Signed   By: Rolm Baptise M.D.   On: 10/02/2019 23:07    ASSESSMENT: This  is a very pleasant 49 years old white male with highly suspicious metastatic lung cancer, pending tissue diagnosis.  He presented with right lower lobe lung mass in addition to right hilar and mediastinal lymphadenopathy as well as extensive metastatic bone disease and lymphadenopathy in the abdomen.   PLAN: I had a lengthy discussion with the patient and his wife today about his current disease stage, and further investigation to confirm his diagnosis and  potential treatment options. I recommended for the patient to complete the staging work-up by ordering a PET scan as well as MRI of the brain to rule out any other metastatic disease. I will arrange for the patient to see Dr. Valeta Harms from pulmonary medicine for consideration of bronchoscopy with endobronchial ultrasound and tissue diagnosis.  Alternative option will be biopsy of the lytic lesion in the posterior left eighth rib by interventional radiology. I will also refer the patient to radiation oncology for consideration of palliative radiotherapy to the painful metastatic bone disease. For pain management I will start the patient on fentanyl patch 50 mcg/hour every 3 days in addition to his current treatment with Percocet.  I will also start the patient on Decadron 4 mg p.o. every 8 hours.  He was also advised to use Tylenol or occasional ibuprofen if needed.  I strongly encouraged the patient to quit his drug abuse with heroin and he understand that I will check drug screen periodically to make sure he is not abusing any additional medication.  He also understands that if he fails the drug screen, I will discontinue prescribing his pain medicine. I will see the patient back for follow-up visit in less than 2 weeks for evaluation and discussion of his treatment options based on the final staging work-up and biopsy results. He was advised to call immediately if he has any other concerning symptoms in the interval. The patient voices understanding of current disease status and treatment options and is in agreement with the current care plan.  All questions were answered. The patient knows to call the clinic with any problems, questions or concerns. We can certainly see the patient much sooner if necessary.  Thank you so much for allowing me to participate in the care of Nicolas Welch. I will continue to follow up the patient with you and assist in his care.  The total time spent in the appointment  was 70 minutes.  Disclaimer: This note was dictated with voice recognition software. Similar sounding words can inadvertently be transcribed and may not be corrected upon review.   Eilleen Kempf October 05, 2019, 9:55 AM

## 2019-10-08 ENCOUNTER — Ambulatory Visit
Admission: RE | Admit: 2019-10-08 | Discharge: 2019-10-08 | Disposition: A | Discharge status: Home / Self Care | Payer: Medicaid Other | Source: Ambulatory Visit | Attending: Radiation Oncology | Admitting: Radiation Oncology

## 2019-10-08 ENCOUNTER — Encounter: Payer: Self-pay | Admitting: Radiation Oncology

## 2019-10-08 ENCOUNTER — Telehealth: Payer: Self-pay | Admitting: Pulmonary Disease

## 2019-10-08 ENCOUNTER — Other Ambulatory Visit: Payer: Self-pay

## 2019-10-08 VITALS — BP 127/91 | HR 105 | Temp 99.6°F | Resp 18 | Ht 72.0 in

## 2019-10-08 DIAGNOSIS — M48061 Spinal stenosis, lumbar region without neurogenic claudication: Secondary | ICD-10-CM | POA: Diagnosis not present

## 2019-10-08 DIAGNOSIS — C3491 Malignant neoplasm of unspecified part of right bronchus or lung: Secondary | ICD-10-CM | POA: Diagnosis not present

## 2019-10-08 DIAGNOSIS — R918 Other nonspecific abnormal finding of lung field: Secondary | ICD-10-CM

## 2019-10-08 DIAGNOSIS — F17210 Nicotine dependence, cigarettes, uncomplicated: Secondary | ICD-10-CM | POA: Insufficient documentation

## 2019-10-08 DIAGNOSIS — Z79899 Other long term (current) drug therapy: Secondary | ICD-10-CM | POA: Insufficient documentation

## 2019-10-08 DIAGNOSIS — Z510 Encounter for antineoplastic radiation therapy: Secondary | ICD-10-CM | POA: Diagnosis not present

## 2019-10-08 DIAGNOSIS — C801 Malignant (primary) neoplasm, unspecified: Secondary | ICD-10-CM | POA: Diagnosis not present

## 2019-10-08 DIAGNOSIS — C7951 Secondary malignant neoplasm of bone: Secondary | ICD-10-CM | POA: Insufficient documentation

## 2019-10-08 NOTE — Progress Notes (Addendum)
Radiation Oncology         [phone]) [phone] ________________________________  Initial Outpatient Consultation  Name: Nicolas Welch MRN: [id number]  Date: 10/08/2019  DOB: [birth year]  WU:[id number], No Pcp Per  Curt Bears, MD   REFERRING PHYSICIAN: Curt Bears, MD  DIAGNOSIS: The primary encounter diagnosis was Non-small cell carcinoma of right lung, stage 4 ([place]). A diagnosis of Bone metastasis ([place]) was also pertinent to this visit.  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 male who is accompanied by no one due to COVID-19 restrictions. The patient presented to the ED on 09/25/2019 chief complaint of one week history of progressively worsening back pain. X-ray of lumbar spine showed an irregularity of the right pedicle at L3.   MRI of lumbar spine on 09/25/2019 showed extensive metastatic bone disease involving the thoracic spine, lumbar spine, and sacrum. There were also some small thoracic disc protrusions but no significant canal or foraminal stenosis. No obvious primary tumor was identified.   CT of chest/abdomen/pelvis on 10/03/2019 showed central right lower lobe necrotic appearing pulmonary mass measuring up to 3.8 cm. There was also associated necrotic appearing right hilar and mediastinal adenopathy, lytic destructive lesions within the ribs, thoracic and lumbar spine, and pelvis that wre compatible for bony metastases. Finally, it showed small low-density lesions within the liver and spleen that were too small to characterize and mildly enlarged right upper quadrant/porta hepatis lymph nodes.  Patient was seen by Dr. Julien Nordmann on 10/05/2019. They discussed his current disease stage and the plan for further investigation to confirm his diagnosis and potential treatment options. MRI of brain and PET scan are scheduled for next week. Patient is being referred to Dr. Valeta Harms from pulmonary  medicine for consideration of bronchoscopy with endobronchial ultrasound and tissue diagnosis. An alternative option is biopsy of the lytic lesion in the posterior left eighth rib by interventional radiology.  PREVIOUS RADIATION THERAPY: No  PAST MEDICAL HISTORY:  Past Medical History:  Diagnosis Date  . Anginal pain (HCC)     PAST SURGICAL HISTORY:History reviewed. No pertinent surgical history.  FAMILY HISTORY: No family history on file.  SOCIAL HISTORY:  Social History   Tobacco Use  . Smoking status: Current Every Day Smoker    Packs/day: 1.00    Types: Cigarettes  . Smokeless tobacco: Never Used  Substance Use Topics  . Alcohol use: No  . Drug use: Yes    Types: Cocaine, IV    Comment: heroin    ALLERGIES: No Known Allergies  MEDICATIONS:  Current Outpatient Medications  Medication Sig Dispense Refill  . cyclobenzaprine (FLEXERIL) 10 MG tablet Take 1 tablet (10 mg total) by mouth 2 (two) times daily as needed for muscle spasms. 20 tablet 0  . dexamethasone (DECADRON) 4 MG tablet Take 1 tablet (4 mg total) by mouth 3 (three) times daily. 45 tablet 0  . fentaNYL (DURAGESIC) 50 MCG/HR Place 1 patch onto the skin every 3 (three) days. 5 patch 0  . ibuprofen (ADVIL) 200 MG tablet Take 200 mg by mouth every 6 (six) hours as needed for moderate pain.    Marland Kitchen oxyCODONE-acetaminophen (PERCOCET) 10-325 MG tablet Take 1 tablet by mouth every 6 (six) hours as needed for pain. 20 tablet 0   No current facility-administered medications for this encounter.    REVIEW OF SYSTEMS:  A 10+ POINT REVIEW OF SYSTEMS WAS OBTAINED including neurology, dermatology, psychiatry, cardiac, respiratory, lymph, extremities,  GI, GU, musculoskeletal, constitutional, reproductive, HEENT.  Reports pain in the mid to lower back region with some radiation into his left hip and left leg.  Is also noticed some mild weakness in his left leg but is able to walk at this time.  He denies any headaches or visual  issues   PHYSICAL EXAM:  height is 6' (1.829 m). His temporal temperature is 99.6 F (37.6 C). His blood pressure is 127/91 (abnormal) and his pulse is 105 (abnormal). His respiration is 18 and oxygen saturation is 96%.   General: Alert and oriented, in no acute distress, he appears in obvious pain and has significant difficulty getting in a comfortable position when in sitting, barely able to get up on the examination table in light of his pain. HEENT: Head is normocephalic. Extraocular movements are intact. Oropharynx is clear.  Teeth in good repair Neck: Neck is supple, no palpable cervical or supraclavicular lymphadenopathy. Heart: Regular in rate and rhythm with no murmurs, rubs, or gallops. Chest: Clear to auscultation bilaterally, with no rhonchi, wheezes, or rales. Abdomen: Soft, nontender, nondistended, with no rigidity or guarding. Extremities: No cyanosis or edema. Lymphatics: see Neck Exam Skin: No concerning lesions. Musculoskeletal: symmetric strength and muscle tone throughout. Neurologic: Cranial nerves II through XII are grossly intact.  Mild weakness in the left lower extremity. speech is fluent. Coordination is intact. Psychiatric: Judgment and insight are intact. Affect is appropriate.   ECOG = 2  0 - Asymptomatic (Fully active, able to carry on all predisease activities without restriction)  1 - Symptomatic but completely ambulatory (Restricted in physically strenuous activity but ambulatory and able to carry out work of a light or sedentary nature. For example, light housework, office work)  2 - Symptomatic, <50% in bed during the day (Ambulatory and capable of all self care but unable to carry out any work activities. Up and about more than 50% of waking hours)  3 - Symptomatic, >50% in bed, but not bedbound (Capable of only limited self-care, confined to bed or chair 50% or more of waking hours)  4 - Bedbound (Completely disabled. Cannot carry on any self-care.  Totally confined to bed or chair)  5 - Death   Eustace Pen MM, Creech RH, Tormey DC, et al. [id number]). "Toxicity and response criteria of the [facility]". [place] Oncol. 5 (6): 649-55  LABORATORY DATA:  Lab Results  Component Value Date   WBC 9.7 10/02/2019   HGB 13.8 10/02/2019   HCT 41.9 10/02/2019   MCV 86.6 10/02/2019   PLT 223 10/02/2019   NEUTROABS 5.2 09/25/2019   Lab Results  Component Value Date   NA 137 10/02/2019   K 3.9 10/02/2019   CL 102 10/02/2019   CO2 27 10/02/2019   GLUCOSE 97 10/02/2019   CREATININE 0.56 (L) 10/02/2019   CALCIUM 9.2 10/02/2019      RADIOGRAPHY: DG Ribs Unilateral W/Chest Right  Result Date: 09/25/2019 CLINICAL DATA:  Recent fall several days ago with right-sided chest pain, initial encounter EXAM: RIGHT RIBS AND CHEST - 3+ VIEW COMPARISON:  None. FINDINGS: Cardiac shadow is within normal limits. The lungs are well aerated bilaterally. Right basilar atelectatic changes are seen. No pneumothorax is noted. The known rib fractures involving the first, second and third ribs on the right posteriorly are again seen and stable. Varying degrees of healing are noted. Undisplaced right tenth rib fracture anterior laterally is noted as well. IMPRESSION: Multiple right-sided rib fractures as described above. No pneumothorax  is noted. Mild right basilar atelectasis is seen. Electronically Signed   By: Inez Catalina M.D.   On: 09/25/2019 09:45   DG Thoracic Spine 2 View  Result Date: 09/25/2019 CLINICAL DATA:  Recent fall with upper back pain, initial encounter EXAM: THORACIC SPINE 2 VIEWS COMPARISON:  None. FINDINGS: Vertebral body height is well maintained. Mild osteophytic changes are noted within the cervical spine. No paraspinal mass lesion is seen. The pedicles are within normal limits. Known right rib fractures are again visualized and stable. IMPRESSION: No acute abnormality in the thoracic spine. Electronically Signed   By: Inez Catalina M.D.   On: 09/25/2019 09:46   DG Lumbar Spine 2-3 Views  Result Date: 09/25/2019 CLINICAL DATA:  Fall several days ago with back pain, initial encounter EXAM: LUMBAR SPINE - 2-3 VIEW COMPARISON:  None. FINDINGS: Five lumbar type vertebral bodies are well visualized. Vertebral body height is well maintained. No anterolisthesis is noted. The right pedicle at L3 is incomplete inferiorly raising suspicion for underlying bony lesion. IMPRESSION: Irregularity of the right pedicle at L3. No other focal abnormality is noted. MRI of the lumbar spine with and without contrast material is recommended for further evaluation. Electronically Signed   By: Inez Catalina M.D.   On: 09/25/2019 09:50   DG Shoulder Right  Result Date: 09/25/2019 CLINICAL DATA:  Recent fall several days ago with right shoulder pain, initial encounter EXAM: RIGHT SHOULDER - 2+ VIEW COMPARISON:  None. FINDINGS: Right clavicle and proximal right humerus are within normal limits. There are fractures of the first second and third ribs on the right with varying degrees of healing. No pneumothorax is noted. IMPRESSION: Multiple right rib fractures with varying degrees of healing. Electronically Signed   By: Inez Catalina M.D.   On: 09/25/2019 09:44   CT Chest W Contrast  Result Date: 10/03/2019 CLINICAL DATA:  Metastatic bone disease seen on prior MRI. Evaluate for primary EXAM: CT CHEST, ABDOMEN, AND PELVIS WITH CONTRAST TECHNIQUE: Multidetector CT imaging of the chest, abdomen and pelvis was performed following the standard protocol during bolus administration of intravenous contrast. CONTRAST:  187mL OMNIPAQUE IOHEXOL 300 MG/ML  SOLN COMPARISON:  MRI thoracic spine and lumbar spine 09/25/2019 FINDINGS: CT CHEST FINDINGS Cardiovascular: Heart is normal size. Aorta is normal caliber. Scattered aortic and coronary artery calcifications. Mediastinum/Nodes: Necrotic mediastinal and right hilar adenoma. Right paratracheal lymph node has a short  axis diameter of 16 mm. Necrotic subcarinal adenopathy has a short axis diameter of 19 mm. Necrotic right hilar lymph nodes have a short axis diameter of 15 mm. No axillary or left hilar adenopathy. Lungs/Pleura: Central necrotic appearing right lower lobe mass. This measures approximately 3.8 x 3.3 cm. Peripheral postobstructive atelectasis or pneumonia in the right lower lobe. No effusions. Musculoskeletal: Multiple bilateral lytic expansile rib lesions, the largest in the posterior left 8th rib measuring up to 4.2 cm on image 33. Expansile lytic lesions noted in the anterior right 1st and 3rd ribs. Lytic lesions noted within the T11 in the T5 vertebral bodies. CT ABDOMEN PELVIS FINDINGS Hepatobiliary: 8 mm low-density lesion in the inferior right hepatic lobe, nonspecific, too small to characterize. Gallbladder contracted. Pancreas: No focal abnormality or ductal dilatation. Spleen: 5 mm low-density lesion within the superior pole of the spleen, nonspecific and too small to characterize. Adrenals/Urinary Tract: No adrenal abnormality. No focal renal abnormality. No stones or hydronephrosis. Urinary bladder is unremarkable. Stomach/Bowel: Stomach, large and small bowel grossly unremarkable. Vascular/Lymphatic: Aortic atherosclerosis. No aneurysm. Prominent  porta hepatis lymph nodes. Portacaval lymph node has a short axis diameter of 12 mm on image 59. Reproductive: No visible focal abnormality. Other: No free fluid or free air. Musculoskeletal: Extensive lytic lesions seen throughout the lumbar spine, sacrum, right pubic bone and inferior pubic ramus and right iliac crest. IMPRESSION: Central right lower lobe necrotic appearing pulmonary mass measuring up to 3.8 cm. Findings concerning for lung cancer. Associated necrotic appearing right hilar and mediastinal adenopathy. Lytic destructive lesions within the ribs, thoracic and lumbar spine, and pelvis compatible with bony metastases. Small low-density lesions  within the liver and spleen which are too small to characterize. Recommend attention on subsequent imaging. Mildly enlarged right upper quadrant/porta hepatis lymph nodes. Aortic atherosclerosis. Electronically Signed   By: Rolm Baptise M.D.   On: 10/03/2019 01:37   MR THORACIC SPINE W WO CONTRAST  Result Date: 09/25/2019 CLINICAL DATA:  Back pain. Abnormal radiographs. EXAM: MRI THORACIC AND LUMBAR SPINE WITHOUT AND WITH CONTRAST TECHNIQUE: Multiplanar and multiecho pulse sequences of the thoracic and lumbar spine were obtained without and with intravenous contrast. CONTRAST:  7.52mL GADAVIST GADOBUTROL 1 MMOL/ML IV SOLN COMPARISON:  Radiographs, same date. FINDINGS: MRI THORACIC SPINE FINDINGS Alignment:  Normal Vertebrae: Extensive metastatic disease involving the thoracic vertebral bodies and posterior elements. No obvious pathologic fracture. No spinal canal compromise. Cord:  No definite cord lesions or cord compression. Paraspinal and other soft tissues: Perivertebral tumor noted at T5 and T6. Multiple rib lesions are also noted. No obvious lung lesions or mediastinal tumor. Disc levels: No canal stenosis is demonstrated. No obvious foraminal lesions. Small left paracentral disc protrusion noted at T8-9 and small right paracentral disc protrusion at T9-10. MRI LUMBAR SPINE FINDINGS Segmentation: There are five lumbar type vertebral bodies. The last full intervertebral disc space is labeled L5-S1. Alignment:  Normal Vertebrae: Extensive metastatic bone disease with innumerable lesions throughout the lumbar spine and sacrum. No pathologic fracture or spinal canal stenosis. Conus medullaris: Extends to the T12-L1 level and appears normal. Paraspinal and other soft tissues: No significant paraspinal or retroperitoneal findings. No obvious renal or pancreatic lesions. No retroperitoneal mass or adenopathy. Disc levels: Diffuse disc bulge at L3-4 with mild spinal and lateral recess stenosis. IMPRESSION: 1.  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 No pathologic fracture or spinal canal compromise. 2. Small thoracic disc protrusions but no significant canal or foraminal stenosis. 3. Diffuse disc bulge at L3-4 with mild spinal and lateral recess stenosis. 4. No obvious primary tumor is identified. Recommend metastatic workup with a CT scan of the chest, abdomen and pelvis with IV and oral contrast. Electronically Signed   By: Marijo Sanes M.D.   On: 09/25/2019 15:08   MR Lumbar Spine W Wo Contrast  Result Date: 09/25/2019 CLINICAL DATA:  Back pain. Abnormal radiographs. EXAM: MRI THORACIC AND LUMBAR SPINE WITHOUT AND WITH CONTRAST TECHNIQUE: Multiplanar and multiecho pulse sequences of the thoracic and lumbar spine were obtained without and with intravenous contrast. CONTRAST:  7.109mL GADAVIST GADOBUTROL 1 MMOL/ML IV SOLN COMPARISON:  Radiographs, same date. FINDINGS: MRI THORACIC SPINE FINDINGS Alignment:  Normal Vertebrae: Extensive metastatic disease involving the thoracic vertebral bodies and posterior elements. No obvious pathologic fracture. No spinal canal compromise. Cord:  No definite cord lesions or cord compression. Paraspinal and other soft tissues: Perivertebral tumor noted at T5 and T6. Multiple rib lesions are also noted. No obvious lung lesions or mediastinal tumor. Disc levels: No canal stenosis is demonstrated. No obvious foraminal  lesions. Small left paracentral disc protrusion noted at T8-9 and small right paracentral disc protrusion at T9-10. MRI LUMBAR SPINE FINDINGS Segmentation: There are five lumbar type vertebral bodies. The last full intervertebral disc space is labeled L5-S1. Alignment:  Normal Vertebrae: Extensive metastatic bone disease with innumerable lesions throughout the lumbar spine and sacrum. No pathologic fracture or spinal canal stenosis. Conus medullaris: Extends to the T12-L1 level and appears normal. Paraspinal and other soft  tissues: No significant paraspinal or retroperitoneal findings. No obvious renal or pancreatic lesions. No retroperitoneal mass or adenopathy. Disc levels: Diffuse disc bulge at L3-4 with mild spinal and lateral recess stenosis. IMPRESSION: 1. 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 No pathologic fracture or spinal canal compromise. 2. Small thoracic disc protrusions but no significant canal or foraminal stenosis. 3. Diffuse disc bulge at L3-4 with mild spinal and lateral recess stenosis. 4. No obvious primary tumor is identified. Recommend metastatic workup with a CT scan of the chest, abdomen and pelvis with IV and oral contrast. Electronically Signed   By: Marijo Sanes M.D.   On: 09/25/2019 15:08   CT ABDOMEN PELVIS W CONTRAST  Result Date: 10/03/2019 CLINICAL DATA:  Metastatic bone disease seen on prior MRI. Evaluate for primary EXAM: CT CHEST, ABDOMEN, AND PELVIS WITH CONTRAST TECHNIQUE: Multidetector CT imaging of the chest, abdomen and pelvis was performed following the standard protocol during bolus administration of intravenous contrast. CONTRAST:  171mL OMNIPAQUE IOHEXOL 300 MG/ML  SOLN COMPARISON:  MRI thoracic spine and lumbar spine 09/25/2019 FINDINGS: CT CHEST FINDINGS Cardiovascular: Heart is normal size. Aorta is normal caliber. Scattered aortic and coronary artery calcifications. Mediastinum/Nodes: Necrotic mediastinal and right hilar adenoma. Right paratracheal lymph node has a short axis diameter of 16 mm. Necrotic subcarinal adenopathy has a short axis diameter of 19 mm. Necrotic right hilar lymph nodes have a short axis diameter of 15 mm. No axillary or left hilar adenopathy. Lungs/Pleura: Central necrotic appearing right lower lobe mass. This measures approximately 3.8 x 3.3 cm. Peripheral postobstructive atelectasis or pneumonia in the right lower lobe. No effusions. Musculoskeletal: Multiple bilateral lytic expansile rib lesions, the largest  in the posterior left 8th rib measuring up to 4.2 cm on image 33. Expansile lytic lesions noted in the anterior right 1st and 3rd ribs. Lytic lesions noted within the T11 in the T5 vertebral bodies. CT ABDOMEN PELVIS FINDINGS Hepatobiliary: 8 mm low-density lesion in the inferior right hepatic lobe, nonspecific, too small to characterize. Gallbladder contracted. Pancreas: No focal abnormality or ductal dilatation. Spleen: 5 mm low-density lesion within the superior pole of the spleen, nonspecific and too small to characterize. Adrenals/Urinary Tract: No adrenal abnormality. No focal renal abnormality. No stones or hydronephrosis. Urinary bladder is unremarkable. Stomach/Bowel: Stomach, large and small bowel grossly unremarkable. Vascular/Lymphatic: Aortic atherosclerosis. No aneurysm. Prominent porta hepatis lymph nodes. Portacaval lymph node has a short axis diameter of 12 mm on image 59. Reproductive: No visible focal abnormality. Other: No free fluid or free air. Musculoskeletal: Extensive lytic lesions seen throughout the lumbar spine, sacrum, right pubic bone and inferior pubic ramus and right iliac crest. IMPRESSION: Central right lower lobe necrotic appearing pulmonary mass measuring up to 3.8 cm. Findings concerning for lung cancer. Associated necrotic appearing right hilar and mediastinal adenopathy. Lytic destructive lesions within the ribs, thoracic and lumbar spine, and pelvis compatible with bony metastases. Small low-density lesions within the liver and spleen which are too small to characterize. Recommend attention on subsequent imaging. Mildly  enlarged right upper quadrant/porta hepatis lymph nodes. Aortic atherosclerosis. Electronically Signed   By: Rolm Baptise M.D.   On: 10/03/2019 01:37   DG Femur 1 View Left  Result Date: 10/02/2019 CLINICAL DATA:  Evaluate for bony metastases EXAM: LEFT FEMUR 1 VIEW COMPARISON:  None. FINDINGS: There is no evidence of fracture or other focal bone lesions.  Soft tissues are unremarkable. IMPRESSION: Negative. Electronically Signed   By: Rolm Baptise M.D.   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  He would be a good candidate for palliative radiation therapy directed at the lumbosacral spine.  I discussed the general course of treatment side effects and potential toxicities of radiation therapy in the situation with patient.  He appears to understand and wishes to proceed with planned course of treatment.   PLAN: The patient has a brain MRI scheduled for 10/15/2019 and a PET scan scheduled for 10/16/2019. The patient will follow-up with Dr. Julien Nordmann in two weeks following final staging work-up and biopsy results.  Patient will proceed with simulation for treatments to the lumbosacral spine later today.  Hopefully will get a tissue diagnosis soon and can proceed with his palliative radiation therapy.  Pain medication with medical oncology exclusively given the patient's prior history of heroin abuse.    ------------------------------------------------  Blair Promise, PhD, MD  This document serves as a record of services personally performed by Gery Pray, MD. It was created on his behalf by Clerance Lav, a trained medical scribe. The creation of this record is based on the scribe's personal observations and the provider's statements to them. This document has been checked and approved by the attending provider.

## 2019-10-08 NOTE — Progress Notes (Signed)
Histology and Location of Primary Cancer: ASSESSMENT: This is a very pleasant 49 years old white male with highly suspicious metastatic lung cancer, pending tissue diagnosis.  He presented with right lower lobe lung mass in addition to right hilar and mediastinal lymphadenopathy as well as extensive metastatic bone disease and lymphadenopathy in the abdomen.  Location(s) of Symptomatic tumor(s): CT scan 10/03/19: IMPRESSION: Central right lower lobe necrotic appearing pulmonary mass measuring up to 3.8 cm. Findings concerning for lung cancer.  Associated necrotic appearing right hilar and mediastinal adenopathy.  Lytic destructive lesions within the ribs, thoracic and lumbar spine, and pelvis compatible with bony metastases.  Past/Anticipated chemotherapy by medical oncology, if any: Per Dr. Julien Nordmann 10/05/19: PLAN: I had a lengthy discussion with the patient and his wife today about his current disease stage, and further investigation to confirm his diagnosis and potential treatment options. I recommended for the patient to complete the staging work-up by ordering a PET scan as well as MRI of the brain to rule out any other metastatic disease. I will arrange for the patient to see Dr. Valeta Harms from pulmonary medicine for consideration of bronchoscopy with endobronchial ultrasound and tissue diagnosis.  Alternative option will be biopsy of the lytic lesion in the posterior left eighth rib by interventional radiology. I will also refer the patient to radiation oncology for consideration of palliative radiotherapy to the painful metastatic bone disease. For pain management I will start the patient on fentanyl patch 50 mcg/hour every 3 days in addition to his current treatment with Percocet.  I will also start the patient on Decadron 4 mg p.o. every 8 hours.  He was also advised to use Tylenol or occasional ibuprofen if needed.  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  He also understands that if he fails the drug screen, I will discontinue prescribing his pain medicine. I will see the patient back for follow-up visit in less than 2 weeks for evaluation and discussion of his treatment options based on the final staging work-up and biopsy results. He was advised to call immediately if he has any other concerning symptoms in the interval. The patient voices understanding of current disease status and treatment options and is in agreement with the current care plan.  All questions were answered. The patient knows to call the clinic with any problems, questions or concerns. We can certainly see the patient much sooner if necessary.  Thank you so much for allowing me to participate in the care of Nicolas Welch. I will continue to follow up the patient with you and assist in his care.   Pain on a scale of 0-10 is: Pt reports pain that "begins between shoulder blades and continues down spine to tailbone". Left hip and left shoulder. Pt rates this pain 6/10.   Ambulatory status? Walker? Wheelchair?: Pt ambulates with steady gait, no assistive device.   SAFETY ISSUES:  Prior radiation? No  Pacemaker/ICD? No  Possible current pregnancy? N/A  Is the patient on methotrexate? No  Additional Complaints / other details:  Pt presents today for initial consult with Dr. Sondra Come for Radiation Oncology. Pt is unaccompanied. Pt is in obvious pain.  BP (!) 127/91 (BP Location: Left Arm, Patient Position: Sitting)   Pulse (!) 105   Temp 99.6 F (37.6 C) (Temporal)   Resp 18   Ht 6' (  1.829 m)   SpO2 96%   BMI 21.02 kg/m   Wt Readings from Last 3 Encounters:  10/02/19 155 lb (70.3 kg)  09/25/19 155 lb (70.3 kg)   Loma Sousa, RN BSN

## 2019-10-08 NOTE — Patient Instructions (Signed)
Coronavirus (COVID-19) Are you at risk?  Are you at risk for the Coronavirus (COVID-19)?  To be considered HIGH RISK for Coronavirus (COVID-19), you have to meet the following criteria:  . Traveled to [place], [place], [place], [place] or [place]; or in the [place] to [place], [place], [place], or [place]; and have fever, cough, and shortness of breath within the last 2 weeks of travel OR . Been in close contact with a person diagnosed with COVID-19 within the last 2 weeks and have fever, cough, and shortness of breath . IF YOU DO NOT MEET THESE CRITERIA, YOU ARE CONSIDERED LOW RISK FOR COVID-19.  What to do if you are HIGH RISK for COVID-19?  . If you are having a medical emergency, call 911. . Seek medical care right away. Before you go to a doctor's office, urgent care or emergency department, call ahead and tell them about your recent travel, contact with someone diagnosed with COVID-19, and your symptoms. You should receive instructions from your physician's office regarding next steps of care.  . When you arrive at healthcare provider, tell the healthcare staff immediately you have returned from visiting [place], [place], [place], [place] or [place]; or traveled in the [place] to [place], [place], [place], or [place]; in the last two weeks or you have been in close contact with a person diagnosed with COVID-19 in the last 2 weeks.   . Tell the health care staff about your symptoms: fever, cough and shortness of breath. . After you have been seen by a medical provider, you will be either: o Tested for (COVID-19) and discharged home on quarantine except to seek medical care if symptoms worsen, and asked to  - Stay home and avoid contact with others until you get your results (4-5 days)  - Avoid travel on public transportation if possible (such as bus, train, or airplane) or o Sent to the Emergency Department by EMS for evaluation, COVID-19 testing, and possible  admission depending on your condition and test results.  What to do if you are LOW RISK for COVID-19?  Reduce your risk of any infection by using the same precautions used for avoiding the common cold or flu:  . Wash your hands often with soap and warm water for at least 20 seconds.  If soap and water are not readily available, use an alcohol-based hand sanitizer with at least 60% alcohol.  . If coughing or sneezing, cover your mouth and nose by coughing or sneezing into the elbow areas of your shirt or coat, into a tissue or into your sleeve (not your hands). . Avoid shaking hands with others and consider head nods or verbal greetings only. . Avoid touching your eyes, nose, or mouth with unwashed hands.  . Avoid close contact with people who are sick. . Avoid places or events with large numbers of people in one location, like concerts or sporting events. . Carefully consider travel plans you have or are making. . If you are planning any travel outside or inside the [place], visit the CDC's Travelers' Health webpage for the latest health notices. . If you have some symptoms but not all symptoms, continue to monitor at home and seek medical attention if your symptoms worsen. . If you are having a medical emergency, call 911.   ADDITIONAL HEALTHCARE OPTIONS FOR PATIENTS  [facility] / e-Visit: [email or web address]         [facility]: [phone]  [facility]: [phone]                   [facility]: [phone]   

## 2019-10-08 NOTE — Telephone Encounter (Signed)
Per pt's chart, pt was to have a consult appt with Dr. Valeta Harms but was instead scheduled with Dr. Lamonte Sakai. A tentative appt has been scheduled with Dr. Valeta Harms for 10:15 on 10/10/19, will need to cancel appt with Dr. Lamonte Sakai after speaking with pt. Per Dr. Valeta Harms pt is needing an EBUS this week on Friday 2/5, order has been placed.  PCC's can this be scheduled asap?

## 2019-10-09 ENCOUNTER — Inpatient Hospital Stay (HOSPITAL_COMMUNITY): Admission: RE | Admit: 2019-10-09 | Payer: Self-pay | Source: Ambulatory Visit

## 2019-10-09 NOTE — Telephone Encounter (Signed)
ATC pt, line busy. ATC pt's emergency contact, Almyra Free, and left VM. Per [place], [facility], she spoke with Almyra Free earlier about pt getting COVID tested prior to Friday's procedure. Almyra Free was agreeable, however, COVID test has not been collected. COVID test was scheduled for 10/09/19 at 0955. Several attempts have been made to reach pt.   Will forward to Dr. Valeta Harms to make aware.

## 2019-10-09 NOTE — Telephone Encounter (Signed)
PCCM: Either way we will get the patient seen tomorrow by myself or Dr. Lamonte Sakai as long as he shows up.  Thanks [name], DO [facility] 10/09/2019 3:34 PM

## 2019-10-10 ENCOUNTER — Institutional Professional Consult (permissible substitution): Payer: Self-pay | Admitting: Emergency Medicine

## 2019-10-10 ENCOUNTER — Other Ambulatory Visit (HOSPITAL_COMMUNITY)
Admission: RE | Admit: 2019-10-10 | Discharge: 2019-10-10 | Disposition: A | Discharge status: Home / Self Care | Payer: Medicaid Other | Source: Ambulatory Visit | Attending: Pulmonary Disease | Admitting: Pulmonary Disease

## 2019-10-10 ENCOUNTER — Ambulatory Visit (INDEPENDENT_AMBULATORY_CARE_PROVIDER_SITE_OTHER): Payer: Self-pay | Admitting: Pulmonary Disease

## 2019-10-10 ENCOUNTER — Encounter (HOSPITAL_COMMUNITY): Payer: Self-pay | Admitting: Pulmonary Disease

## 2019-10-10 ENCOUNTER — Encounter: Payer: Self-pay | Admitting: Pulmonary Disease

## 2019-10-10 ENCOUNTER — Telehealth: Payer: Self-pay | Admitting: Pulmonary Disease

## 2019-10-10 ENCOUNTER — Other Ambulatory Visit: Payer: Self-pay

## 2019-10-10 VITALS — BP 102/70 | HR 101 | Temp 97.3°F | Ht 72.0 in | Wt 170.4 lb

## 2019-10-10 DIAGNOSIS — Z01812 Encounter for preprocedural laboratory examination: Secondary | ICD-10-CM | POA: Insufficient documentation

## 2019-10-10 DIAGNOSIS — Z20822 Contact with and (suspected) exposure to covid-19: Secondary | ICD-10-CM | POA: Insufficient documentation

## 2019-10-10 DIAGNOSIS — C7951 Secondary malignant neoplasm of bone: Secondary | ICD-10-CM

## 2019-10-10 DIAGNOSIS — R918 Other nonspecific abnormal finding of lung field: Secondary | ICD-10-CM

## 2019-10-10 DIAGNOSIS — Z720 Tobacco use: Secondary | ICD-10-CM

## 2019-10-10 DIAGNOSIS — R590 Localized enlarged lymph nodes: Secondary | ICD-10-CM

## 2019-10-10 LAB — SARS CORONAVIRUS 2 (TAT 6-24 HRS): SARS Coronavirus 2: NEGATIVE

## 2019-10-10 MED ORDER — HYDROMORPHONE HCL 8 MG PO TABS: 4.0000 mg | ORAL_TABLET | Freq: Three times a day (TID) | ORAL | 0 refills | Status: AC | PRN

## 2019-10-10 MED ORDER — FENTANYL 75 MCG/HR TD PT72: 1.0000 | MEDICATED_PATCH | TRANSDERMAL | 0 refills | Status: AC

## 2019-10-10 NOTE — Anesthesia Preprocedure Evaluation (Addendum)
Anesthesia Evaluation  Patient identified by MRN, date of birth, ID band Patient awake    Reviewed: Allergy & Precautions, NPO status , Patient's Chart, lab work & pertinent test results  Airway Mallampati: I       Dental no notable dental hx. (+) Teeth Intact   Pulmonary Current Smoker,    Pulmonary exam normal breath sounds clear to auscultation       Cardiovascular Normal cardiovascular exam Rhythm:Regular Rate:Normal     Neuro/Psych Depression Bipolar Disorder negative neurological ROS     GI/Hepatic   Endo/Other    Renal/GU      Musculoskeletal   Abdominal Normal abdominal exam  (+)   Peds  Hematology negative hematology ROS (+)   Anesthesia Other Findings   Reproductive/Obstetrics                             Anesthesia Physical Anesthesia Plan  ASA: II  Anesthesia Plan: General   Post-op Pain Management:    Induction: Intravenous  PONV Risk Score and Plan: 1  Airway Management Planned: LMA  Additional Equipment: None  Intra-op Plan:   Post-operative Plan: Extubation in OR  Informed Consent: I have reviewed the patients History and Physical, chart, labs and discussed the procedure including the risks, benefits and alternatives for the proposed anesthesia with the patient or authorized representative who has indicated his/her understanding and acceptance.     Dental advisory given  Plan Discussed with: CRNA  Anesthesia Plan Comments: (See PAT note written by Myra Gianotti, PA-C. )       Anesthesia Quick Evaluation

## 2019-10-10 NOTE — Telephone Encounter (Signed)
Dr. Valeta Harms, please advise on this if pt should still pick up the fentanyl rx that was prescribed today when pt just picked up rx for it 1/29?

## 2019-10-10 NOTE — Progress Notes (Signed)
Anesthesia Chart Review: SAME DAY WORK-UP   Case: [id number] Date/Time: 10/12/19 1300   Procedure: VIDEO BRONCHOSCOPY WITH ENDOBRONCHIAL ULTRASOUND (N/A )   Anesthesia type: General   Pre-op diagnosis: lung mass   Location: [facility] / [facility]   Surgeons: [name], DO      DISCUSSION: Patient is a 49 year old male scheduled for the above procedure. 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 Initial films showed an irregularity at L3, so MRI T/L spine ordered which showed extensive metastatic bone disease involving the thoracic and lumbar pain and sacrum, diffuse disc bulge and L3-4 and no primary tumor source. Neurosurgery was consulted but no indication for neurosurgical intervention, and work-up to identify primary source of metastatic disease recommended; however patient left AMA. He returned to the ED on 10/02/19 to complete recommended CT imaging which revealed a central RLL lung mass with associated right hilar and mediastinal adenopathy concerning for lung cancer. He was referred to oncology and pulmonology.     Other history includes smoking, illicit drug use (including heroin, cocaine), hepatitis C, Bipolar disorder, GERD, chest pain ("angina" was documented 12/17/13 in setting of cocaine and heroin use, seen in ED, negative serial troponins; denied history of stress, cath, echo).   He is scheduled to see Dr. Valeta Harms on 10/10/19 and also get his COVID-19 test. He reported some intermittent SOB and chest pain that he attributes to his lung mass/metastasis. History of cocaine and heroin use. He reported last "cocaine" use on 10/09/19. RN notified Dr. Valeta Harms of symptoms and cocaine use and also discussed with anesthesiologist Adele Barthel, MD. Will follow-up Dr. Juline Patch note, but will need further assessment on the day of surgery. (UPDATE 10/10/19 4:20 PM: COVID-19 test in  process. He did go to his pulmonology visit with Dr. Valeta Harms this afternoon. Patient very anxious about his recent diagnosis and wants to proceed with surgery for definitive tissue diagnosis. Also having a lot of pain which is felt to be related to his bone mets. He was out of oxycodone previously prescribed. Patient did discuss his polysubstance abuse with Dr. Valeta Harms. It appears fentanyl patches increased to 75 mcg and Dilaudid 4-8 mg tablets prescribed for breakthrough pain. Anesthesiologist to evaluate on the day of surgery. He will EKG on arrival. He has an up-to-date CBC and CMET from 10/02/19.)   VS:  BP Readings from Last 3 Encounters:  10/08/19 (!) 127/91  10/05/19 111/72  10/03/19 110/73   Pulse Readings from Last 3 Encounters:  10/08/19 (!) 105  10/05/19 (!) 111  10/03/19 87    PROVIDERS: Patient, No Pcp Per   - Curt Bears, MD is HEM-ONC. Evaluation on 10/05/19. At that time complained of pain all over and had been using heroin and Percocet with mild improvement of his symptoms. No hemoptysis. Reported ~ 20 lb weight loss in 3 weeks. PET scan and brain MRI recommended for staging, and consideration for bronchoscopy/EBUS for tissue diagnosis versus biopsy of lytic lesion in the posterior left eighth rib by IR. Also referred to RAD-ONC for consideration of palliative radiotherapy. He prescribed patient Fentanyl patch 50 mcg/hour Q 3 days in addition to current treatment with Percocet and also given Decadron 4 mg Q 8 hours. He was strongly advised to quit his drug abuse and will plan periodic drug screens.   - Gery Pray, MD is RAD-ONC. Evaluation on 10/08/19  and underwent simulation for treatment to lumbosacral spine that day with hopes to proceed with palliative radiation therapy once tissue diagnosis obtained.    LABS: Currently, last lab results include: Lab Results  Component Value Date   WBC 9.7 10/02/2019   HGB 13.8 10/02/2019   HCT 41.9 10/02/2019   PLT 223 10/02/2019    GLUCOSE 97 10/02/2019   ALT 19 10/02/2019   AST 22 10/02/2019   NA 137 10/02/2019   K 3.9 10/02/2019   CL 102 10/02/2019   CREATININE 0.56 (L) 10/02/2019   BUN 6 10/02/2019   CO2 27 10/02/2019     IMAGES: CT chest/abd/pelvis 10/03/19: IMPRESSION: - Central right lower lobe necrotic appearing pulmonary mass measuring up to 3.8 cm. Findings concerning for lung cancer. - Associated necrotic appearing right hilar and mediastinal adenopathy. - Lytic destructive lesions within the ribs, thoracic and lumbar spine, and pelvis compatible with bony metastases. - Small low-density lesions within the liver and spleen which are too small to characterize. Recommend attention on subsequent imaging. - Mildly enlarged right upper quadrant/porta hepatis lymph nodes. - Aortic atherosclerosis.   MRI T/L-spine 09/25/19: IMPRESSION: 1. 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 No pathologic fracture or spinal canal compromise. 2. Small thoracic disc protrusions but no significant canal or foraminal stenosis. 3. Diffuse disc bulge at L3-4 with mild spinal and lateral recess stenosis. 4. No obvious primary tumor is identified. Recommend metastatic workup with a CT scan of the chest, abdomen and pelvis with IV and oral contrast.   CXR/right ribs xray 09/25/19: FINDINGS: Cardiac shadow is within normal limits. The lungs are well aerated bilaterally. Right basilar atelectatic changes are seen. No pneumothorax is noted. The known rib fractures involving the first, second and third ribs on the right posteriorly are again seen and stable. Varying degrees of healing are noted. Undisplaced right tenth rib fracture anterior laterally is noted as well. IMPRESSION: Multiple right-sided rib fractures as described above. No pneumothorax is noted. Mild right basilar atelectasis is seen.   EKG: Last EKG seen is from 12/17/13. For updated EKG on the day of  surgery.   CV: N/A   Past Medical History:  Diagnosis Date  . Anginal pain ([place])   . Bipolar disorder ([place])   . Cancer ([place])     " lung and metastatic bone cancer "  . Depression   . GERD (gastroesophageal reflux disease)   . Hepatitis    HEP C  . Lung mass     Past Surgical History:  Procedure Laterality Date  . APPENDECTOMY    . TONSILLECTOMY AND ADENOIDECTOMY    . WISDOM TOOTH EXTRACTION      MEDICATIONS: No current facility-administered medications for this encounter.   . cyclobenzaprine (FLEXERIL) 10 MG tablet  . dexamethasone (DECADRON) 4 MG tablet  . ibuprofen (ADVIL) 200 MG tablet  . oxyCODONE-acetaminophen (PERCOCET) 10-325 MG tablet  . fentaNYL (DURAGESIC) 75 MCG/HR  . HYDROmorphone (DILAUDID) 8 MG tablet     Myra Gianotti, PA-C Surgical Short Stay/Anesthesiology [facility] Phone [phone] [facility] Phone [phone] 10/10/2019 4:30 PM

## 2019-10-10 NOTE — Progress Notes (Signed)
Synopsis: Referred in February 2021 for concern for stage IV lung cancer, in need of tissue diagnosis by No ref. provider found  Subjective:   PATIENT ID: Nicolas Welch GENDER: male DOB: 01-18-1971, MRN: [id number]  Chief Complaint  Patient presents with  . Consult    This is a 49 year old gentleman who was seen 10/05/2019, by Dr. Earlie Server, patient had radiographic images highly concerning for a metastatic lung cancer with multiple bony metastasis.  Patient was referred to [place] for tissue diagnosis.  He has also had consultation with radiation oncology patient already has a brain MRI and PET scan ordered for 10/15/2019, 10/16/2019 respectively.  Due to the extensive bone metastasis radiation oncology felt that he was a good candidate for palliative radiation therapy to the lumbosacral spine.  OV 10/10/2019: Patient seen today in the office for evaluation prior to bronchoscopy.  He is in quite a bit of pain.  He has ran out of his oxycodone.  Was also placed on fentanyl patches at 50 mcg an hour.  He is having difficulty managing his pain due to multiple bony metastasis.  He was seen by oncology last week.  He does admit that he was a prior opiate abuser and has a polysubstance abuse history and does have a significant tolerance for this.  He is very open about this and wants to make sure he we understand his needs at this time.  Overall he is very anxious about his recent diagnosis and very scared about what he has been told recently.  He is thankful that we are able to see him and get him scheduled for his tissue diagnosis.  He has no questions.  We are planning with bronchoscopy for this coming Friday.    Past Medical History:  Diagnosis Date  . Anginal pain ([place])   . Bipolar disorder ([place])   . Cancer ([place])     " lung and metastatic bone cancer "  . Depression   . GERD (gastroesophageal reflux disease)   . Hepatitis    HEP C  . Lung mass      No family history on file.   Past Surgical  History:  Procedure Laterality Date  . APPENDECTOMY    . TONSILLECTOMY AND ADENOIDECTOMY    . WISDOM TOOTH EXTRACTION      Social History   Socioeconomic History  . Marital status: Legally Separated    Spouse name: Not on file  . Number of children: Not on file  . Years of education: Not on file  . Highest education level: Not on file  Occupational History  . Not on file  Tobacco Use  . Smoking status: Current Every Day Smoker    Packs/day: 0.50    Types: Cigarettes  . Smokeless tobacco: Never Used  . Tobacco comment: 1/2 per day  Substance and Sexual Activity  . Alcohol use: No  . 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 Sexual activity: Not Currently  Other Topics Concern  . Not on file  Social History Narrative  . Not on file   Social Determinants of Health   Financial Resource Strain:   . Difficulty of Paying Living Expenses: Not on file  Food Insecurity:   . Worried About Charity fundraiser in the Last Year: Not on file  . Ran Out of Food in the Last Year: Not on file  Transportation Needs:   . Lack of  Transportation (Medical): Not on file  . Lack of Transportation (Non-Medical): Not on file  Physical Activity:   . Days of Exercise per Week: Not on file  . Minutes of Exercise per Session: Not on file  Stress:   . Feeling of Stress : Not on file  Social Connections:   . Frequency of Communication with Friends and Family: Not on file  . Frequency of Social Gatherings with Friends and Family: Not on file  . Attends Religious Services: Not on file  . Active Member of Clubs or Organizations: Not on file  . Attends Archivist Meetings: Not on file  . Marital Status: Not on file  Intimate Partner Violence:   . Fear of Current or Ex-Partner: Not on file  . Emotionally Abused: Not on file  . Physically Abused: Not on file  . Sexually Abused: Not on file     Allergies  Allergen Reactions  . Egg [Eggs Or  Egg-Derived Products] Hives, Itching and Swelling  . Milk-Related Compounds Hives, Itching and Swelling  . Wheat Bran Hives, Itching and Swelling     Outpatient Medications Prior to Visit  Medication Sig Dispense Refill  . cyclobenzaprine (FLEXERIL) 10 MG tablet Take 1 tablet (10 mg total) by mouth 2 (two) times daily as needed for muscle spasms. 20 tablet 0  . dexamethasone (DECADRON) 4 MG tablet Take 1 tablet (4 mg total) by mouth 3 (three) times daily. 45 tablet 0  . fentaNYL (DURAGESIC) 50 MCG/HR Place 1 patch onto the skin every 3 (three) days. 5 patch 0  . ibuprofen (ADVIL) 200 MG tablet Take 800 mg by mouth every 6 (six) hours as needed for moderate pain.     Marland Kitchen oxyCODONE-acetaminophen (PERCOCET) 10-325 MG tablet Take 1 tablet by mouth every 6 (six) hours as needed for pain. 20 tablet 0   No facility-administered medications prior to visit.    Review of Systems  Constitutional: Negative for chills, fever, malaise/fatigue and weight loss.  HENT: Negative for hearing loss, sore throat and tinnitus.   Eyes: Negative for blurred vision and double vision.  Respiratory: Positive for cough and shortness of breath. Negative for hemoptysis, sputum production, wheezing and stridor.   Cardiovascular: Negative for chest pain, palpitations, orthopnea, leg swelling and PND.  Gastrointestinal: Negative for abdominal pain, constipation, diarrhea, heartburn, nausea and vomiting.  Genitourinary: Negative for dysuria, hematuria and urgency.  Musculoskeletal: Negative for joint pain and myalgias.  Skin: Negative for itching and rash.  Neurological: Negative for dizziness, tingling, weakness and headaches.  Endo/Heme/Allergies: Negative for environmental allergies. Does not bruise/bleed easily.  Psychiatric/Behavioral: Negative for depression. The patient is not nervous/anxious and does not have insomnia.   All other systems reviewed and are negative.    Objective:  Physical Exam Vitals reviewed.   Constitutional:      General: He is not in acute distress.    Appearance: He is well-developed.  HENT:     Head: Normocephalic and atraumatic.  Eyes:     General: No scleral icterus.    Conjunctiva/sclera: Conjunctivae normal.     Pupils: Pupils are equal, round, and reactive to light.  Neck:     Vascular: No JVD.     Trachea: No tracheal deviation.  Cardiovascular:     Rate and Rhythm: Normal rate and regular rhythm.     Heart sounds: Normal heart sounds. No murmur.  Pulmonary:     Effort: Pulmonary effort is normal. No tachypnea, accessory muscle usage or respiratory distress.  Breath sounds: No stridor. No wheezing, rhonchi or rales.  Abdominal:     General: Bowel sounds are normal.     Palpations: Abdomen is soft.  Musculoskeletal:        General: No tenderness.     Cervical back: Neck supple.  Lymphadenopathy:     Cervical: No cervical adenopathy.  Skin:    General: Skin is warm and dry.     Capillary Refill: Capillary refill takes less than 2 seconds.     Findings: No rash.  Neurological:     Mental Status: He is alert and oriented to person, place, and time.  Psychiatric:        Behavior: Behavior normal.      Vitals:   10/10/19 1439  BP: 102/70  Pulse: (!) 101  Temp: (!) 97.3 F (36.3 C)  TempSrc: Temporal  SpO2: 95%  Weight: 170 lb 6.4 oz (77.3 kg)  Height: 6' (1.829 m)   95% on RA BMI Readings from Last 3 Encounters:  10/10/19 23.11 kg/m  10/08/19 21.02 kg/m  10/05/19 21.02 kg/m   Wt Readings from Last 3 Encounters:  10/10/19 170 lb 6.4 oz (77.3 kg)  10/02/19 155 lb (70.3 kg)  09/25/19 155 lb (70.3 kg)     CBC    Component Value Date/Time   WBC 9.7 10/02/2019 2130   RBC 4.84 10/02/2019 2130   HGB 13.8 10/02/2019 2130   HCT 41.9 10/02/2019 2130   PLT 223 10/02/2019 2130   MCV 86.6 10/02/2019 2130   MCH 28.5 10/02/2019 2130   MCHC 32.9 10/02/2019 2130   RDW 13.5 10/02/2019 2130   LYMPHSABS 2.2 09/25/2019 0851   MONOABS 1.0  09/25/2019 0851   EOSABS 0.2 09/25/2019 0851   BASOSABS 0.1 09/25/2019 0851    Chest Imaging: 10/03/2019 CT chest: Central right lower lobe pulmonary mass measuring 3.8 cm concerning for primary lung cancer.  Significant necrotic appearing lymph nodes within the right hilum and mediastinal adenopathy.  Pulmonary Functions Testing Results: No flowsheet data found.    Assessment & Plan:     ICD-10-CM   1. Lung mass  R91.8   2. Mediastinal adenopathy  R59.0   3. Hilar adenopathy  R59.0   4. Tobacco abuse  Z72.0   5. Bony metastasis ([place])  C79.51     Discussion: This is a 49 year old gentleman with CT imaging consistent with an advanced stage bronchogenic carcinoma.  He has necrotic mediastinal adenopathy and necrotic right lower lobe lung mass.  CT images were reviewed with the patient today in the office.  At this time concerning for an advanced stage disease.  Unfortunately he does have a significant amount of cancer related pain.  Due to these bony metastasis.  He is already established care with radiation oncology.  He is recently started on fentanyl patches and has ran out of his oxycodone that was ordered.  He does have a prior history of polysubstance abuse.  And likely states he has a higher threshold for pain needs due to this patient is very open about this and just wanted to make [place] aware.  Acute cancer related pain.  This is a unfortunate situation and treatment for his disease is palliative.  Plan Following Extensive Data Review & Interpretation:  . I reviewed prior external note(s) from 10/05/2019, Dr. Earlie Server from cancer center, medical oncology, Dr. Sondra Come, radiation oncology, [place] from 10/08/2019. . I reviewed the result(s) of CMP, CBC, both within normal limits from 10/02/2019 . I have ordered  referral for procedure scheduling as below.  Patient was ordered 10, 75 mcg fentanyl patches, 30 tablets of 8 mg Dilaudid for breakthrough pain.  Independent interpretation  of tests . Review of patient's 10/01/2018 CT chest images revealed central right lower lobe 3.8 cm necrotic pulmonary mass with associated necrotic right hilar mediastinal adenopathy and multiple lytic bony lesions throughout the chest and lumbar spine, thoracic spine and ribs.  All of this concerning for a advanced stage bronchogenic carcinoma. The patient's images have been independently reviewed by me.    Discussion of management and tissue diagnostic plans completed with Dr. Earlie Server from the cancer center.  Today in the office we discussed the risk benefits and alternatives of proceeding with tissue diagnosis.  I believe the next best step is to receive a soft tissue diagnosis for the advanced stage lung cancer.  Obtaining a bony tissue diagnosis does not allow for appropriate IHC and genetic tumor evaluation due to the bony decalcification process of tissue.  Therefore, we will proceed with a bronchoscopy with endobronchial ultrasound and transbronchial needle aspiration biopsies and cellblock analysis.  Due to the necrotic nature of the lymph nodes we may need to consider primary tumor sampling which can be completed using peripheral targeted radial endobronchial ultrasound.  She is Covid testing was completed this afternoon.  We discussed the risk of bleeding, pneumothorax and anesthesia today in the office.  Patient is agreeable to proceed.  Patient has been scheduled for this Friday afternoon   Current Outpatient Medications:  .  cyclobenzaprine (FLEXERIL) 10 MG tablet, Take 1 tablet (10 mg total) by mouth 2 (two) times daily as needed for muscle spasms., Disp: 20 tablet, Rfl: 0 .  dexamethasone (DECADRON) 4 MG tablet, Take 1 tablet (4 mg total) by mouth 3 (three) times daily., Disp: 45 tablet, Rfl: 0 .  ibuprofen (ADVIL) 200 MG tablet, Take 800 mg by mouth every 6 (six) hours as needed for moderate pain. , Disp: , Rfl:  .  oxyCODONE-acetaminophen (PERCOCET) 10-325 MG tablet, Take 1 tablet  by mouth every 6 (six) hours as needed for pain., Disp: 20 tablet, Rfl: 0 .  fentaNYL (DURAGESIC) 75 MCG/HR, Place 1 patch onto the skin every 3 (three) days for 7 days., Disp: 10 patch, Rfl: 0 .  HYDROmorphone (DILAUDID) 8 MG tablet, Take 0.5-1 tablets (4-8 mg total) by mouth every 8 (eight) hours as needed for up to 7 days for severe pain., Disp: 30 tablet, Rfl: 0   [name], DO [facility] 10/10/2019 2:56 PM

## 2019-10-10 NOTE — H&P (View-Only) (Signed)
Synopsis: Referred in February 2021 for concern for stage IV lung cancer, in need of tissue diagnosis by No ref. provider found  Subjective:   PATIENT ID: Nicolas Welch GENDER: male DOB: 18-May-1971, MRN: [id number]  Chief Complaint  Patient presents with  . Consult    This is a 49 year old gentleman who was seen 10/05/2019, by Dr. Earlie Server, patient had radiographic images highly concerning for a metastatic lung cancer with multiple bony metastasis.  Patient was referred to [place] for tissue diagnosis.  He has also had consultation with radiation oncology patient already has a brain MRI and PET scan ordered for 10/15/2019, 10/16/2019 respectively.  Due to the extensive bone metastasis radiation oncology felt that he was a good candidate for palliative radiation therapy to the lumbosacral spine.  OV 10/10/2019: Patient seen today in the office for evaluation prior to bronchoscopy.  He is in quite a bit of pain.  He has ran out of his oxycodone.  Was also placed on fentanyl patches at 50 mcg an hour.  He is having difficulty managing his pain due to multiple bony metastasis.  He was seen by oncology last week.  He does admit that he was a prior opiate abuser and has a polysubstance abuse history and does have a significant tolerance for this.  He is very open about this and wants to make sure he we understand his needs at this time.  Overall he is very anxious about his recent diagnosis and very scared about what he has been told recently.  He is thankful that we are able to see him and get him scheduled for his tissue diagnosis.  He has no questions.  We are planning with bronchoscopy for this coming Friday.    Past Medical History:  Diagnosis Date  . Anginal pain ([place])   . Bipolar disorder ([place])   . Cancer ([place])     " lung and metastatic bone cancer "  . Depression   . GERD (gastroesophageal reflux disease)   . Hepatitis    HEP C  . Lung mass      No family history on file.   Past Surgical  History:  Procedure Laterality Date  . APPENDECTOMY    . TONSILLECTOMY AND ADENOIDECTOMY    . WISDOM TOOTH EXTRACTION      Social History   Socioeconomic History  . Marital status: Legally Separated    Spouse name: Not on file  . Number of children: Not on file  . Years of education: Not on file  . Highest education level: Not on file  Occupational History  . Not on file  Tobacco Use  . Smoking status: Current Every Day Smoker    Packs/day: 0.50    Types: Cigarettes  . Smokeless tobacco: Never Used  . Tobacco comment: 1/2 per day  Substance and Sexual Activity  . Alcohol use: No  . 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 Sexual activity: Not Currently  Other Topics Concern  . Not on file  Social History Narrative  . Not on file   Social Determinants of Health   Financial Resource Strain:   . Difficulty of Paying Living Expenses: Not on file  Food Insecurity:   . Worried About Charity fundraiser in the Last Year: Not on file  . Ran Out of Food in the Last Year: Not on file  Transportation Needs:   . Lack of  Transportation (Medical): Not on file  . Lack of Transportation (Non-Medical): Not on file  Physical Activity:   . Days of Exercise per Week: Not on file  . Minutes of Exercise per Session: Not on file  Stress:   . Feeling of Stress : Not on file  Social Connections:   . Frequency of Communication with Friends and Family: Not on file  . Frequency of Social Gatherings with Friends and Family: Not on file  . Attends Religious Services: Not on file  . Active Member of Clubs or Organizations: Not on file  . Attends Archivist Meetings: Not on file  . Marital Status: Not on file  Intimate Partner Violence:   . Fear of Current or Ex-Partner: Not on file  . Emotionally Abused: Not on file  . Physically Abused: Not on file  . Sexually Abused: Not on file     Allergies  Allergen Reactions  . Egg [Eggs Or  Egg-Derived Products] Hives, Itching and Swelling  . Milk-Related Compounds Hives, Itching and Swelling  . Wheat Bran Hives, Itching and Swelling     Outpatient Medications Prior to Visit  Medication Sig Dispense Refill  . cyclobenzaprine (FLEXERIL) 10 MG tablet Take 1 tablet (10 mg total) by mouth 2 (two) times daily as needed for muscle spasms. 20 tablet 0  . dexamethasone (DECADRON) 4 MG tablet Take 1 tablet (4 mg total) by mouth 3 (three) times daily. 45 tablet 0  . fentaNYL (DURAGESIC) 50 MCG/HR Place 1 patch onto the skin every 3 (three) days. 5 patch 0  . ibuprofen (ADVIL) 200 MG tablet Take 800 mg by mouth every 6 (six) hours as needed for moderate pain.     Marland Kitchen oxyCODONE-acetaminophen (PERCOCET) 10-325 MG tablet Take 1 tablet by mouth every 6 (six) hours as needed for pain. 20 tablet 0   No facility-administered medications prior to visit.    Review of Systems  Constitutional: Negative for chills, fever, malaise/fatigue and weight loss.  HENT: Negative for hearing loss, sore throat and tinnitus.   Eyes: Negative for blurred vision and double vision.  Respiratory: Positive for cough and shortness of breath. Negative for hemoptysis, sputum production, wheezing and stridor.   Cardiovascular: Negative for chest pain, palpitations, orthopnea, leg swelling and PND.  Gastrointestinal: Negative for abdominal pain, constipation, diarrhea, heartburn, nausea and vomiting.  Genitourinary: Negative for dysuria, hematuria and urgency.  Musculoskeletal: Negative for joint pain and myalgias.  Skin: Negative for itching and rash.  Neurological: Negative for dizziness, tingling, weakness and headaches.  Endo/Heme/Allergies: Negative for environmental allergies. Does not bruise/bleed easily.  Psychiatric/Behavioral: Negative for depression. The patient is not nervous/anxious and does not have insomnia.   All other systems reviewed and are negative.    Objective:  Physical Exam Vitals reviewed.   Constitutional:      General: He is not in acute distress.    Appearance: He is well-developed.  HENT:     Head: Normocephalic and atraumatic.  Eyes:     General: No scleral icterus.    Conjunctiva/sclera: Conjunctivae normal.     Pupils: Pupils are equal, round, and reactive to light.  Neck:     Vascular: No JVD.     Trachea: No tracheal deviation.  Cardiovascular:     Rate and Rhythm: Normal rate and regular rhythm.     Heart sounds: Normal heart sounds. No murmur.  Pulmonary:     Effort: Pulmonary effort is normal. No tachypnea, accessory muscle usage or respiratory distress.  Breath sounds: No stridor. No wheezing, rhonchi or rales.  Abdominal:     General: Bowel sounds are normal.     Palpations: Abdomen is soft.  Musculoskeletal:        General: No tenderness.     Cervical back: Neck supple.  Lymphadenopathy:     Cervical: No cervical adenopathy.  Skin:    General: Skin is warm and dry.     Capillary Refill: Capillary refill takes less than 2 seconds.     Findings: No rash.  Neurological:     Mental Status: He is alert and oriented to person, place, and time.  Psychiatric:        Behavior: Behavior normal.      Vitals:   10/10/19 1439  BP: 102/70  Pulse: (!) 101  Temp: (!) 97.3 F (36.3 C)  TempSrc: Temporal  SpO2: 95%  Weight: 170 lb 6.4 oz (77.3 kg)  Height: 6' (1.829 m)   95% on RA BMI Readings from Last 3 Encounters:  10/10/19 23.11 kg/m  10/08/19 21.02 kg/m  10/05/19 21.02 kg/m   Wt Readings from Last 3 Encounters:  10/10/19 170 lb 6.4 oz (77.3 kg)  10/02/19 155 lb (70.3 kg)  09/25/19 155 lb (70.3 kg)     CBC    Component Value Date/Time   WBC 9.7 10/02/2019 2130   RBC 4.84 10/02/2019 2130   HGB 13.8 10/02/2019 2130   HCT 41.9 10/02/2019 2130   PLT 223 10/02/2019 2130   MCV 86.6 10/02/2019 2130   MCH 28.5 10/02/2019 2130   MCHC 32.9 10/02/2019 2130   RDW 13.5 10/02/2019 2130   LYMPHSABS 2.2 09/25/2019 0851   MONOABS 1.0  09/25/2019 0851   EOSABS 0.2 09/25/2019 0851   BASOSABS 0.1 09/25/2019 0851    Chest Imaging: 10/03/2019 CT chest: Central right lower lobe pulmonary mass measuring 3.8 cm concerning for primary lung cancer.  Significant necrotic appearing lymph nodes within the right hilum and mediastinal adenopathy.  Pulmonary Functions Testing Results: No flowsheet data found.    Assessment & Plan:     ICD-10-CM   1. Lung mass  R91.8   2. Mediastinal adenopathy  R59.0   3. Hilar adenopathy  R59.0   4. Tobacco abuse  Z72.0   5. Bony metastasis ([place])  C79.51     Discussion: This is a 49 year old gentleman with CT imaging consistent with an advanced stage bronchogenic carcinoma.  He has necrotic mediastinal adenopathy and necrotic right lower lobe lung mass.  CT images were reviewed with the patient today in the office.  At this time concerning for an advanced stage disease.  Unfortunately he does have a significant amount of cancer related pain.  Due to these bony metastasis.  He is already established care with radiation oncology.  He is recently started on fentanyl patches and has ran out of his oxycodone that was ordered.  He does have a prior history of polysubstance abuse.  And likely states he has a higher threshold for pain needs due to this patient is very open about this and just wanted to make [place] aware.  Acute cancer related pain.  This is a unfortunate situation and treatment for his disease is palliative.  Plan Following Extensive Data Review & Interpretation:  . I reviewed prior external note(s) from 10/05/2019, Dr. Earlie Server from cancer center, medical oncology, Dr. Sondra Come, radiation oncology, [place] from 10/08/2019. . I reviewed the result(s) of CMP, CBC, both within normal limits from 10/02/2019 . I have ordered  referral for procedure scheduling as below.  Patient was ordered 10, 75 mcg fentanyl patches, 30 tablets of 8 mg Dilaudid for breakthrough pain.  Independent interpretation  of tests . Review of patient's 10/01/2018 CT chest images revealed central right lower lobe 3.8 cm necrotic pulmonary mass with associated necrotic right hilar mediastinal adenopathy and multiple lytic bony lesions throughout the chest and lumbar spine, thoracic spine and ribs.  All of this concerning for a advanced stage bronchogenic carcinoma. The patient's images have been independently reviewed by me.    Discussion of management and tissue diagnostic plans completed with Dr. Earlie Server from the cancer center.  Today in the office we discussed the risk benefits and alternatives of proceeding with tissue diagnosis.  I believe the next best step is to receive a soft tissue diagnosis for the advanced stage lung cancer.  Obtaining a bony tissue diagnosis does not allow for appropriate IHC and genetic tumor evaluation due to the bony decalcification process of tissue.  Therefore, we will proceed with a bronchoscopy with endobronchial ultrasound and transbronchial needle aspiration biopsies and cellblock analysis.  Due to the necrotic nature of the lymph nodes we may need to consider primary tumor sampling which can be completed using peripheral targeted radial endobronchial ultrasound.  She is Covid testing was completed this afternoon.  We discussed the risk of bleeding, pneumothorax and anesthesia today in the office.  Patient is agreeable to proceed.  Patient has been scheduled for this Friday afternoon   Current Outpatient Medications:  .  cyclobenzaprine (FLEXERIL) 10 MG tablet, Take 1 tablet (10 mg total) by mouth 2 (two) times daily as needed for muscle spasms., Disp: 20 tablet, Rfl: 0 .  dexamethasone (DECADRON) 4 MG tablet, Take 1 tablet (4 mg total) by mouth 3 (three) times daily., Disp: 45 tablet, Rfl: 0 .  ibuprofen (ADVIL) 200 MG tablet, Take 800 mg by mouth every 6 (six) hours as needed for moderate pain. , Disp: , Rfl:  .  oxyCODONE-acetaminophen (PERCOCET) 10-325 MG tablet, Take 1 tablet  by mouth every 6 (six) hours as needed for pain., Disp: 20 tablet, Rfl: 0 .  fentaNYL (DURAGESIC) 75 MCG/HR, Place 1 patch onto the skin every 3 (three) days for 7 days., Disp: 10 patch, Rfl: 0 .  HYDROmorphone (DILAUDID) 8 MG tablet, Take 0.5-1 tablets (4-8 mg total) by mouth every 8 (eight) hours as needed for up to 7 days for severe pain., Disp: 30 tablet, Rfl: 0   [name], DO [facility] 10/10/2019 2:56 PM

## 2019-10-10 NOTE — Progress Notes (Signed)
Pt stated that he has had SOB and chest pain " that comes and goes, for a couple of months, it is not my heart -it is because of the mass. "  Nurse reminded pt to seek medical treatment if condition worsens. Pt denies having a cardiologist and PCP.  Pt denies having an echo, stress test and cardiac cath. Pt denies having an EKG in the last year.  Pt made aware to stop taking vitamins, fish oil and herbal medications. Do not take any NSAIDs ie: Ibuprofen, Advil, Naproxen (Aleve), Motrin, BC and Goody Powder. Pt reminded to quarantine after COVID test today. Pt verbalized understanding of all pre-op instructions. PA, Anesthesiology, asked to review pt history.

## 2019-10-10 NOTE — Patient Instructions (Addendum)
Thank you for visiting Dr. Valeta Harms at [facility]. Today we recommend the following:  New scripts for pain meds  Bronchoscopy is scheduled for Friday 10/12/2019 Nothing by mouth on Thursday night past midnight until after procedure  Return in about 4 weeks (around 11/07/2019).    Please do your part to reduce the spread of COVID-19.

## 2019-10-11 DIAGNOSIS — Z510 Encounter for antineoplastic radiation therapy: Secondary | ICD-10-CM | POA: Diagnosis not present

## 2019-10-11 NOTE — Telephone Encounter (Signed)
Noted. Dr. Valeta Harms spoke with the pharmacist. Nothing further needed at this time.

## 2019-10-11 NOTE — Telephone Encounter (Signed)
Pt was seen on 2.3.2021 by Dr. Valeta Harms and has been set up for bronch for 2.5.21. Nothing further needed at this time.

## 2019-10-11 NOTE — Telephone Encounter (Signed)
Pt did not come for his consult yesterday with Dr. Valeta Harms.

## 2019-10-11 NOTE — Telephone Encounter (Signed)
PCCM:  Thanks. I spoke with Roderic Palau this morning.  One box was dispense.  I will send a new rx on Friday if needed   [name], DO [facility] 10/11/2019 10:33 AM

## 2019-10-12 ENCOUNTER — Telehealth: Payer: Self-pay | Admitting: Pulmonary Disease

## 2019-10-12 ENCOUNTER — Encounter (HOSPITAL_COMMUNITY)
Admission: RE | Disposition: A | Discharge status: Home / Self Care | Payer: Self-pay | Source: Home / Self Care | Attending: Pulmonary Disease

## 2019-10-12 ENCOUNTER — Other Ambulatory Visit: Payer: Self-pay

## 2019-10-12 ENCOUNTER — Ambulatory Visit (HOSPITAL_COMMUNITY): Payer: Medicaid Other | Admitting: Vascular Surgery

## 2019-10-12 ENCOUNTER — Encounter (HOSPITAL_COMMUNITY): Payer: Self-pay | Admitting: Pulmonary Disease

## 2019-10-12 ENCOUNTER — Ambulatory Visit (HOSPITAL_COMMUNITY)
Admission: RE | Admit: 2019-10-12 | Discharge: 2019-10-12 | Disposition: A | Discharge status: Home / Self Care | Payer: Medicaid Other | Attending: Pulmonary Disease | Admitting: Pulmonary Disease

## 2019-10-12 DIAGNOSIS — F319 Bipolar disorder, unspecified: Secondary | ICD-10-CM | POA: Diagnosis not present

## 2019-10-12 DIAGNOSIS — G893 Neoplasm related pain (acute) (chronic): Secondary | ICD-10-CM | POA: Diagnosis not present

## 2019-10-12 DIAGNOSIS — Z8619 Personal history of other infectious and parasitic diseases: Secondary | ICD-10-CM | POA: Diagnosis not present

## 2019-10-12 DIAGNOSIS — C3431 Malignant neoplasm of lower lobe, right bronchus or lung: Secondary | ICD-10-CM | POA: Insufficient documentation

## 2019-10-12 DIAGNOSIS — R590 Localized enlarged lymph nodes: Secondary | ICD-10-CM

## 2019-10-12 DIAGNOSIS — F17210 Nicotine dependence, cigarettes, uncomplicated: Secondary | ICD-10-CM | POA: Diagnosis not present

## 2019-10-12 DIAGNOSIS — C7951 Secondary malignant neoplasm of bone: Secondary | ICD-10-CM | POA: Diagnosis not present

## 2019-10-12 DIAGNOSIS — K219 Gastro-esophageal reflux disease without esophagitis: Secondary | ICD-10-CM | POA: Diagnosis not present

## 2019-10-12 DIAGNOSIS — Z79899 Other long term (current) drug therapy: Secondary | ICD-10-CM | POA: Insufficient documentation

## 2019-10-12 DIAGNOSIS — C3491 Malignant neoplasm of unspecified part of right bronchus or lung: Secondary | ICD-10-CM | POA: Diagnosis present

## 2019-10-12 DIAGNOSIS — C778 Secondary and unspecified malignant neoplasm of lymph nodes of multiple regions: Secondary | ICD-10-CM | POA: Insufficient documentation

## 2019-10-12 DIAGNOSIS — F1911 Other psychoactive substance abuse, in remission: Secondary | ICD-10-CM | POA: Insufficient documentation

## 2019-10-12 HISTORY — PX: VIDEO BRONCHOSCOPY WITH ENDOBRONCHIAL ULTRASOUND: SHX6177

## 2019-10-12 HISTORY — DX: Other nonspecific abnormal finding of lung field: R91.8

## 2019-10-12 HISTORY — PX: BRONCHIAL WASHINGS: SHX5105

## 2019-10-12 HISTORY — DX: Depression, unspecified: F32.A

## 2019-10-12 HISTORY — DX: Bipolar disorder, unspecified: F31.9

## 2019-10-12 HISTORY — PX: BRONCHIAL NEEDLE ASPIRATION BIOPSY: SHX5106

## 2019-10-12 HISTORY — DX: Malignant (primary) neoplasm, unspecified: C80.1

## 2019-10-12 HISTORY — PX: BRONCHIAL BIOPSY: SHX5109

## 2019-10-12 HISTORY — DX: Gastro-esophageal reflux disease without esophagitis: K21.9

## 2019-10-12 HISTORY — DX: Inflammatory liver disease, unspecified: K75.9

## 2019-10-12 LAB — APTT: aPTT: 24 seconds (ref 24–36)

## 2019-10-12 LAB — CBC
HCT: 45.1 % (ref 39.0–52.0)
Hemoglobin: 14.7 g/dL (ref 13.0–17.0)
MCH: 28.3 pg (ref 26.0–34.0)
MCHC: 32.6 g/dL (ref 30.0–36.0)
MCV: 86.9 fL (ref 80.0–100.0)
Platelets: 261 10*3/uL (ref 150–400)
RBC: 5.19 MIL/uL (ref 4.22–5.81)
RDW: 14.1 % (ref 11.5–15.5)
WBC: 22.8 10*3/uL — ABNORMAL HIGH (ref 4.0–10.5)
nRBC: 0 % (ref 0.0–0.2)

## 2019-10-12 LAB — PROTIME-INR
INR: 1 (ref 0.8–1.2)
Prothrombin Time: 12.9 seconds (ref 11.4–15.2)

## 2019-10-12 LAB — COMPREHENSIVE METABOLIC PANEL
ALT: 33 U/L (ref 0–44)
AST: 22 U/L (ref 15–41)
Albumin: 3.1 g/dL — ABNORMAL LOW (ref 3.5–5.0)
Alkaline Phosphatase: 157 U/L — ABNORMAL HIGH (ref 38–126)
Anion gap: 13 (ref 5–15)
BUN: 22 mg/dL — ABNORMAL HIGH (ref 6–20)
CO2: 22 mmol/L (ref 22–32)
Calcium: 9 mg/dL (ref 8.9–10.3)
Chloride: 98 mmol/L (ref 98–111)
Creatinine, Ser: 0.74 mg/dL (ref 0.61–1.24)
GFR calc Af Amer: >60 mL/min (ref >60)
GFR calc non Af Amer: >60 mL/min (ref >60)
Glucose, Bld: 147 mg/dL — ABNORMAL HIGH (ref 70–99)
Potassium: 4.2 mmol/L (ref 3.5–5.1)
Sodium: 133 mmol/L — ABNORMAL LOW (ref 135–145)
Total Bilirubin: 0.5 mg/dL (ref 0.3–1.2)
Total Protein: 7.4 g/dL (ref 6.5–8.1)

## 2019-10-12 SURGERY — BRONCHOSCOPY, WITH EBUS
Anesthesia: General

## 2019-10-12 MED ORDER — DEXMEDETOMIDINE HCL IN NACL 200 MCG/50ML IV SOLN
INTRAVENOUS | Status: DC | PRN
  Administered 2019-10-12: 12 ug via INTRAVENOUS
  Administered 2019-10-12: 8 ug via INTRAVENOUS

## 2019-10-12 MED ORDER — LIDOCAINE HCL (PF) 1 % IJ SOLN
INTRAMUSCULAR | Status: DC | PRN
  Administered 2019-10-12 (×2): 5 mL

## 2019-10-12 MED ORDER — FENTANYL CITRATE (PF) 100 MCG/2ML IJ SOLN
INTRAMUSCULAR | Status: AC
  Filled 2019-10-12: qty 2

## 2019-10-12 MED ORDER — LIDOCAINE 2% (20 MG/ML) 5 ML SYRINGE
INTRAMUSCULAR | Status: DC | PRN
  Administered 2019-10-12: 100 mg via INTRAVENOUS

## 2019-10-12 MED ORDER — ONDANSETRON HCL 4 MG/2ML IJ SOLN
INTRAMUSCULAR | Status: DC | PRN
  Administered 2019-10-12: 4 mg via INTRAVENOUS

## 2019-10-12 MED ORDER — LIDOCAINE HCL (PF) 1 % IJ SOLN
INTRAMUSCULAR | Status: AC
  Filled 2019-10-12: qty 30

## 2019-10-12 MED ORDER — MIDAZOLAM HCL 2 MG/2ML IJ SOLN
INTRAMUSCULAR | Status: AC
  Filled 2019-10-12: qty 2

## 2019-10-12 MED ORDER — ONDANSETRON HCL 4 MG/2ML IJ SOLN: 4.0000 mg | Freq: Once | INTRAMUSCULAR | Status: DC | PRN

## 2019-10-12 MED ORDER — LACTATED RINGERS IV SOLN: INTRAVENOUS | Status: DC

## 2019-10-12 MED ORDER — DEXAMETHASONE SODIUM PHOSPHATE 10 MG/ML IJ SOLN
INTRAMUSCULAR | Status: DC | PRN
  Administered 2019-10-12: 10 mg via INTRAVENOUS

## 2019-10-12 MED ORDER — MIDAZOLAM HCL 5 MG/5ML IJ SOLN
INTRAMUSCULAR | Status: DC | PRN
  Administered 2019-10-12: 2 mg via INTRAVENOUS

## 2019-10-12 MED ORDER — HYDROMORPHONE HCL 1 MG/ML IJ SOLN: 0.2500 mg | INTRAMUSCULAR | Status: DC | PRN

## 2019-10-12 MED ORDER — PROPOFOL 10 MG/ML IV BOLUS
INTRAVENOUS | Status: DC | PRN
  Administered 2019-10-12: 200 mg via INTRAVENOUS
  Administered 2019-10-12: 50 mg via INTRAVENOUS

## 2019-10-12 MED ORDER — MEPERIDINE HCL 100 MG/ML IJ SOLN: 6.2500 mg | INTRAMUSCULAR | Status: DC | PRN

## 2019-10-12 MED ORDER — FENTANYL CITRATE (PF) 100 MCG/2ML IJ SOLN
INTRAMUSCULAR | Status: DC | PRN
  Administered 2019-10-12: 100 ug via INTRAVENOUS

## 2019-10-12 MED ORDER — DEXAMETHASONE SODIUM PHOSPHATE 10 MG/ML IJ SOLN
INTRAMUSCULAR | Status: AC
  Filled 2019-10-12: qty 1

## 2019-10-12 SURGICAL SUPPLY — 30 items
BRUSH CYTOL CELLEBRITY 1.5X140 (MISCELLANEOUS) IMPLANT
CANISTER SUCT 3000ML PPV (MISCELLANEOUS) ×4 IMPLANT
CONT SPEC 4OZ CLIKSEAL STRL BL (MISCELLANEOUS) ×4 IMPLANT
COVER BACK TABLE 60X90IN (DRAPES) ×4 IMPLANT
COVER DOME SNAP 22 D (MISCELLANEOUS) ×4 IMPLANT
FORCEPS BIOP RJ4 1.8 (CUTTING FORCEPS) IMPLANT
GAUZE SPONGE 4X4 12PLY STRL (GAUZE/BANDAGES/DRESSINGS) ×4 IMPLANT
GLOVE BIO SURGEON STRL SZ7.5 (GLOVE) ×4 IMPLANT
GOWN STRL REUS W/ TWL LRG LVL3 (GOWN DISPOSABLE) ×2 IMPLANT
GOWN STRL REUS W/TWL LRG LVL3 (GOWN DISPOSABLE) ×2
KIT CLEAN ENDO COMPLIANCE (KITS) ×8 IMPLANT
KIT TURNOVER KIT B (KITS) ×4 IMPLANT
MARKER SKIN DUAL TIP RULER LAB (MISCELLANEOUS) ×4 IMPLANT
NEEDLE EBUS SONO TIP PENTAX (NEEDLE) ×4 IMPLANT
NS IRRIG 1000ML POUR BTL (IV SOLUTION) ×4 IMPLANT
OIL SILICONE PENTAX (PARTS (SERVICE/REPAIRS)) ×4 IMPLANT
PAD ARMBOARD 7.5X6 YLW CONV (MISCELLANEOUS) ×8 IMPLANT
SOL ANTI FOG 6CC (MISCELLANEOUS) ×2 IMPLANT
SOLUTION ANTI FOG 6CC (MISCELLANEOUS) ×2
SYR 20CC LL (SYRINGE) ×8 IMPLANT
SYR 20ML ECCENTRIC (SYRINGE) ×8 IMPLANT
SYR 50ML SLIP (SYRINGE) IMPLANT
SYR 5ML LUER SLIP (SYRINGE) ×4 IMPLANT
TOWEL OR 17X24 6PK STRL BLUE (TOWEL DISPOSABLE) ×4 IMPLANT
TRAP SPECIMEN MUCOUS 40CC (MISCELLANEOUS) IMPLANT
TUBE CONNECTING 20'X1/4 (TUBING) ×2
TUBE CONNECTING 20X1/4 (TUBING) ×6 IMPLANT
UNDERPAD 30X30 (UNDERPADS AND DIAPERS) ×4 IMPLANT
VALVE DISPOSABLE (MISCELLANEOUS) ×4 IMPLANT
WATER STERILE IRR 1000ML POUR (IV SOLUTION) ×4 IMPLANT

## 2019-10-12 NOTE — Anesthesia Postprocedure Evaluation (Signed)
Anesthesia Post Note  Patient: JAYLEE LANTRY  Procedure(s) Performed: VIDEO BRONCHOSCOPY WITH ENDOBRONCHIAL ULTRASOUND (N/A ) FINE NEEDLE ASPIRATION (FNA) LINEAR LUNG BIOPSY BRONCHIAL WASHINGS     Patient location during evaluation: PACU Anesthesia Type: General Level of consciousness: awake and alert Pain management: pain level controlled Vital Signs Assessment: post-procedure vital signs reviewed and stable Respiratory status: spontaneous breathing, nonlabored ventilation, respiratory function stable and patient connected to nasal cannula oxygen Cardiovascular status: blood pressure returned to baseline and stable Postop Assessment: no apparent nausea or vomiting Anesthetic complications: no    Last Vitals:  Vitals:   10/12/19 1510 10/12/19 1530  BP: (!) 134/96 (!) 124/91  Pulse:  98  Resp: (!) 27 16  Temp: (!) 36.3 C 36.6 C  SpO2: 97% 94%    Last Pain:  Vitals:   10/12/19 1530  TempSrc:   PainSc: 0-No pain                 Lyon Dumont DAVID

## 2019-10-12 NOTE — Anesthesia Procedure Notes (Signed)
Procedure Name: LMA Insertion Date/Time: 10/12/2019 1:57 PM Performed by: Lance Coon, CRNA Pre-anesthesia Checklist: Patient identified, Emergency Drugs available, Suction available, Patient being monitored and Timeout performed Patient Re-evaluated:Patient Re-evaluated prior to induction Oxygen Delivery Method: Circle system utilized Preoxygenation: Pre-oxygenation with 100% oxygen Induction Type: IV induction LMA: LMA with gastric port inserted LMA Size: 4.0 Number of attempts: 1 Placement Confirmation: positive ETCO2 and breath sounds checked- equal and bilateral Tube secured with: Tape Dental Injury: Teeth and Oropharynx as per pre-operative assessment

## 2019-10-12 NOTE — Transfer of Care (Signed)
Immediate Anesthesia Transfer of Care Note  Patient: Nicolas Welch  Procedure(s) Performed: VIDEO BRONCHOSCOPY WITH ENDOBRONCHIAL ULTRASOUND (N/A ) FINE NEEDLE ASPIRATION (FNA) LINEAR LUNG BIOPSY BRONCHIAL WASHINGS  Patient Location: PACU  Anesthesia Type:General  Level of Consciousness: drowsy and patient cooperative  Airway & Oxygen Therapy: Patient Spontanous Breathing  Post-op Assessment: Report given to RN and Post -op Vital signs reviewed and stable  Post vital signs: Reviewed and stable  Last Vitals:  Vitals Value Taken Time  BP 134/96 10/12/19 1513  Temp    Pulse 105 10/12/19 1514  Resp 20 10/12/19 1514  SpO2 95 % 10/12/19 1514  Vitals shown include unvalidated device data.  Last Pain:  Vitals:   10/12/19 1314  TempSrc:   PainSc: 0-No pain      Patients Stated Pain Goal: 3 (07/09/14 9458)  Complications: No apparent anesthesia complications

## 2019-10-12 NOTE — Telephone Encounter (Signed)
Pt scheduled for EBUS at 1:00 today.   Spoke with Almyra Free to make aware.  Nothing further needed at this time- will close encounter.

## 2019-10-12 NOTE — Op Note (Signed)
Video Bronchoscopy with Endobronchial Ultrasound Procedure Note  Date of Operation: 10/12/2019  Pre-op Diagnosis: Right lower lobe lung mass, right hilum, mediastinal adenopathy  Post-op Diagnosis: Right lower lobe lung mass, right hilum, mediastinal adenopathy  Surgeon: [name], DO   Assistants: None   Anesthesia: General LMA anesthesia  Operation: Flexible video fiberoptic bronchoscopy with endobronchial ultrasound and biopsies.  Estimated Blood Loss: Minimal, <4OX   Complications: None  Indications and History: Nicolas Welch is a 49 y.o. 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  The risks, benefits, complications, treatment options and expected outcomes were discussed with the patient.  The possibilities of pneumothorax, pneumonia, reaction to medication, pulmonary aspiration, perforation of a viscus, bleeding, failure to diagnose a condition and creating a complication requiring transfusion or operation were discussed with the patient who freely signed the consent.    Description of Procedure: The patient was examined in the preoperative area and history and data from the preprocedure consultation were reviewed. It was deemed appropriate to proceed.  The patient was taken to endoscopy [facility] room 1, identified as Nicolas Welch and the procedure verified as Flexible Video Fiberoptic Bronchoscopy.  A Time Out was held and the above information confirmed. After being taken to the operating room general anesthesia was initiated and the patient  was orally intubated. The video fiberoptic bronchoscope was introduced via the endotracheal tube and a general inspection was performed which showed normal right and left lung anatomy, scattered thick mucus plugging within the distal lower lobes, anthracosis  throughout, pitting, endobronchial lesion visible within the right lower lobe anterior subsegment. The standard scope was then withdrawn and the endobronchial ultrasound was used to identify and characterize the peritracheal, hilar and bronchial lymph nodes. Inspection showed enlarged station 7, enlarged hilum/lung mass within the right lower along the anterior wall.. Using real-time ultrasound guidance Wang needle biopsies were take from Station station 7 and right hilar/right lower lobe lung mass along the anterior wall.  In gross cross-section the station 7 adenopathy was approximately 3.5 cm in largest ultrasound cross-section, the right lower lobe mass extended at least 4 cm beyond the ultrasound view.  Transbronchial needle aspirations were sent from both locations and were sent for cytology.  Following the endobronchial ultrasound portion of the procedure the endobronchial ultrasound scope was exchanged for a standard 2.8 therapeutic Olympus bronchoscope.  This was passed into the airway visualization of the vocal cords were normal.  Using [facility] biopsy forceps multiple pieces of tissue was taken from the endobronchial lesion within the right lower lobe anterior subsegment.  At the completion of the endobronchial biopsies we completed a BAL to the right lower lobe with moderate return to be sent for cytology.  At the end of the procedure the therapeutic bronchoscope was used for suctioning and clearance of secretions blood clots throughout both airways.  Saline was used to help irrigate and clean both right and left.  There was some spillage of blood clot into the left mainstem which was retracted en bloc with suction. The patient tolerated the procedure well without apparent complications. There was no significant blood loss. The bronchoscope was withdrawn. Anesthesia was reversed and the patient was taken to the PACU for recovery.   Samples: 1. Wang needle biopsies from station 7 node 2. Wang  needle biopsies from right lower lobe/right hilar mass 3.  Endobronchial  forcep biopsies 4.  BAL right lower lobe  Plans:  The patient will be discharged from the PACU to home when recovered from anesthesia. We will review the cytology, pathology and microbiology results with the patient when they become available. Outpatient followup will be with [name], DO and Dr. Julien Nordmann.   [facility] 10/12/2019 3:09 PM

## 2019-10-12 NOTE — Telephone Encounter (Signed)
Spoke with Maudie Mercury at scheduling. She stated that the patient is still scheduled for today at 1pm at the endo lab. Advised her of the situation. She advised that if the procedure needs to be rescheduled, we would need to call the charge nurse at Endo at [phone].   Spoke with Butch Penny at [facility]. She stated that the patient had just checked in. Despite him being 2 hours late, they will still be able to do his procedure. Dr. Valeta Harms is aware.   Nothing further needed at time of call.

## 2019-10-12 NOTE — Discharge Instructions (Addendum)
Flexible Bronchoscopy, Care After This sheet gives you information about how to care for yourself after your test. Your doctor may also give you more specific instructions. If you have problems or questions, contact your doctor. Follow these instructions at home: Eating and drinking  The day after the test, go back to your normal diet. Driving  Do not drive for 24 hours if you were given a medicine to help you relax (sedative).  Do not drive or use heavy machinery while taking prescription pain medicine. General instructions   Take over-the-counter and prescription medicines only as told by your doctor.  Return to your normal activities as told. Ask what activities are safe for you.  Do not use any products that have nicotine or tobacco in them. This includes cigarettes and e-cigarettes. If you need help quitting, ask your doctor.  Keep all follow-up visits as told by your doctor. This is important. It is very important if you had a tissue sample (biopsy) taken. Get help right away if:  You have shortness of breath that gets worse.  You get light-headed.  You feel like you are going to pass out (faint).  You have chest pain.  You cough up: ? More than a little blood. ? More blood than before. Summary  Do not eat or drink anything (not even water) for 2 hours after your test, or until your numbing medicine wears off.  Do not use cigarettes. Do not use e-cigarettes.  Get help right away if you have chest pain. This information is not intended to replace advice given to you by your health care provider. Make sure you discuss any questions you have with your health care provider. Document Revised: 08/05/2017 Document Reviewed: 09/10/2016 Elsevier Patient Education  2020 [employer].

## 2019-10-12 NOTE — Interval H&P Note (Signed)
History and Physical Interval Note:  10/12/2019 1:06 PM  Nicolas Welch  has presented today for surgery, with the diagnosis of lung mass.  The various methods of treatment have been discussed with the patient and family. After consideration of risks, benefits and other options for treatment, the patient has consented to  Procedure(s): [place] (N/A) as a surgical intervention.  The patient's history has been reviewed, patient examined, no change in status, stable for surgery.  I have reviewed the patient's chart and labs.  Questions were answered to the patient's satisfaction.    No barriers to proceed, discussed risks, benefits and alternatives. Discussed risks of bleeding an pneumothorax. Patient is agreeable to proceed.   [place]

## 2019-10-15 ENCOUNTER — Ambulatory Visit (HOSPITAL_COMMUNITY)
Admission: RE | Admit: 2019-10-15 | Discharge: 2019-10-15 | Disposition: A | Discharge status: Home / Self Care | Payer: Medicaid Other | Source: Ambulatory Visit | Attending: Internal Medicine | Admitting: Internal Medicine

## 2019-10-15 ENCOUNTER — Ambulatory Visit
Admission: RE | Admit: 2019-10-15 | Discharge: 2019-10-15 | Disposition: A | Discharge status: Home / Self Care | Payer: Medicaid Other | Source: Ambulatory Visit | Attending: Radiation Oncology | Admitting: Radiation Oncology

## 2019-10-15 ENCOUNTER — Other Ambulatory Visit: Payer: Self-pay

## 2019-10-15 DIAGNOSIS — C3490 Malignant neoplasm of unspecified part of unspecified bronchus or lung: Secondary | ICD-10-CM | POA: Diagnosis not present

## 2019-10-15 DIAGNOSIS — Z510 Encounter for antineoplastic radiation therapy: Secondary | ICD-10-CM | POA: Diagnosis not present

## 2019-10-15 MED ORDER — GADOBUTROL 1 MMOL/ML IV SOLN
8.0000 mL | Freq: Once | INTRAVENOUS | Status: AC | PRN
  Administered 2019-10-15: 8 mL via INTRAVENOUS

## 2019-10-15 NOTE — Progress Notes (Signed)
Post procedure call completed for procedure on 2/5.  Pt stated he has severe pain that is prohibiting him from walking/moving.  Pain rated on 9 out of 10 scale.  Pain is similar to pain prior to procedure.  Advised pt to contact provider for assistance with pain management.  Pt verbalized understanding.

## 2019-10-16 ENCOUNTER — Ambulatory Visit
Admission: RE | Admit: 2019-10-16 | Discharge: 2019-10-16 | Disposition: A | Discharge status: Home / Self Care | Payer: Medicaid Other | Source: Ambulatory Visit | Attending: Radiation Oncology | Admitting: Radiation Oncology

## 2019-10-16 ENCOUNTER — Encounter (HOSPITAL_COMMUNITY): Admission: RE | Admit: 2019-10-16 | Payer: Self-pay | Source: Ambulatory Visit

## 2019-10-16 ENCOUNTER — Other Ambulatory Visit: Payer: Self-pay

## 2019-10-16 DIAGNOSIS — Z510 Encounter for antineoplastic radiation therapy: Secondary | ICD-10-CM | POA: Diagnosis not present

## 2019-10-16 LAB — SURGICAL PATHOLOGY

## 2019-10-16 LAB — CYTOLOGY - NON PAP

## 2019-10-17 ENCOUNTER — Ambulatory Visit
Admission: RE | Admit: 2019-10-17 | Discharge: 2019-10-17 | Disposition: A | Discharge status: Home / Self Care | Payer: Medicaid Other | Source: Ambulatory Visit | Attending: Radiation Oncology | Admitting: Radiation Oncology

## 2019-10-17 ENCOUNTER — Other Ambulatory Visit: Payer: Self-pay | Admitting: Physician Assistant

## 2019-10-17 ENCOUNTER — Other Ambulatory Visit: Payer: Self-pay | Admitting: Radiation Therapy

## 2019-10-17 DIAGNOSIS — C3491 Malignant neoplasm of unspecified part of right bronchus or lung: Secondary | ICD-10-CM

## 2019-10-18 ENCOUNTER — Ambulatory Visit: Payer: Medicaid Other

## 2019-10-18 ENCOUNTER — Inpatient Hospital Stay: Payer: Medicaid Other | Attending: Internal Medicine

## 2019-10-18 ENCOUNTER — Inpatient Hospital Stay: Payer: Medicaid Other | Admitting: Physician Assistant

## 2019-10-18 ENCOUNTER — Telehealth: Payer: Self-pay | Admitting: Physician Assistant

## 2019-10-18 DIAGNOSIS — F17210 Nicotine dependence, cigarettes, uncomplicated: Secondary | ICD-10-CM | POA: Insufficient documentation

## 2019-10-18 DIAGNOSIS — G893 Neoplasm related pain (acute) (chronic): Secondary | ICD-10-CM | POA: Insufficient documentation

## 2019-10-18 DIAGNOSIS — C3491 Malignant neoplasm of unspecified part of right bronchus or lung: Secondary | ICD-10-CM | POA: Insufficient documentation

## 2019-10-18 DIAGNOSIS — C7951 Secondary malignant neoplasm of bone: Secondary | ICD-10-CM | POA: Insufficient documentation

## 2019-10-18 NOTE — Telephone Encounter (Signed)
R/s appt per 2/11 sch message - pt awre of appt date and time

## 2019-10-19 ENCOUNTER — Ambulatory Visit
Admission: RE | Admit: 2019-10-19 | Discharge: 2019-10-19 | Disposition: A | Discharge status: Home / Self Care | Payer: Medicaid Other | Source: Ambulatory Visit | Attending: Radiation Oncology | Admitting: Radiation Oncology

## 2019-10-19 ENCOUNTER — Telehealth: Payer: Self-pay | Admitting: *Deleted

## 2019-10-19 ENCOUNTER — Other Ambulatory Visit: Payer: Self-pay

## 2019-10-19 ENCOUNTER — Encounter: Payer: Self-pay | Admitting: Radiation Oncology

## 2019-10-19 DIAGNOSIS — Z510 Encounter for antineoplastic radiation therapy: Secondary | ICD-10-CM | POA: Diagnosis not present

## 2019-10-19 NOTE — Telephone Encounter (Signed)
Oncology Nurse Navigator Documentation  Oncology Nurse Navigator Flowsheets 10/19/2019  Navigator Location [facility]  Navigator Encounter Type Telephone/Nicolas Welch was a no show with Nicolas Welch yesterday. I called today to check on him but was unable to reach him. I did leave vm message for him to call with name and phone number.   Telephone Outgoing Call  [employer] Needs Education  Education Other  Interventions Education  Acuity Level 2-Minimal Needs (1-2 Barriers Identified)  Education Method Verbal  Time Spent with Patient 15

## 2019-10-22 ENCOUNTER — Telehealth: Payer: Self-pay | Admitting: *Deleted

## 2019-10-22 ENCOUNTER — Ambulatory Visit (INDEPENDENT_AMBULATORY_CARE_PROVIDER_SITE_OTHER): Payer: Self-pay | Admitting: Internal Medicine

## 2019-10-22 ENCOUNTER — Telehealth: Payer: Self-pay | Admitting: Medical Oncology

## 2019-10-22 ENCOUNTER — Encounter: Payer: Self-pay | Admitting: Internal Medicine

## 2019-10-22 ENCOUNTER — Ambulatory Visit: Payer: Self-pay | Admitting: Internal Medicine

## 2019-10-22 ENCOUNTER — Other Ambulatory Visit: Payer: Self-pay

## 2019-10-22 ENCOUNTER — Ambulatory Visit
Admission: RE | Admit: 2019-10-22 | Discharge: 2019-10-22 | Disposition: A | Discharge status: Home / Self Care | Payer: Medicaid Other | Source: Ambulatory Visit | Attending: Radiation Oncology | Admitting: Radiation Oncology

## 2019-10-22 ENCOUNTER — Telehealth: Payer: Self-pay | Admitting: Pulmonary Disease

## 2019-10-22 VITALS — BP 118/80 | HR 109 | Temp 97.2°F | Ht 72.0 in | Wt 160.0 lb

## 2019-10-22 DIAGNOSIS — C3490 Malignant neoplasm of unspecified part of unspecified bronchus or lung: Secondary | ICD-10-CM

## 2019-10-22 DIAGNOSIS — Z510 Encounter for antineoplastic radiation therapy: Secondary | ICD-10-CM | POA: Diagnosis not present

## 2019-10-22 MED ORDER — HYDROMORPHONE HCL 8 MG PO TABS: 8.0000 mg | ORAL_TABLET | ORAL | 0 refills | Status: DC | PRN

## 2019-10-22 MED ORDER — FENTANYL 75 MCG/HR TD PT72: 1.0000 | MEDICATED_PATCH | TRANSDERMAL | 0 refills | Status: DC

## 2019-10-22 MED ORDER — CYCLOBENZAPRINE HCL 10 MG PO TABS: 10.0000 mg | ORAL_TABLET | Freq: Three times a day (TID) | ORAL | 0 refills | Status: DC | PRN

## 2019-10-22 NOTE — Telephone Encounter (Signed)
Oncology Nurse Navigator Documentation  Oncology Nurse Navigator Flowsheets 10/22/2019  Navigator Location [facility]  Navigator Encounter Type Telephone/I called patient and emergency contact to check on Nicolas Welch.  I received several messages patient is not feeling well and I call to check on him. I did not reach anyone but did leave vm message with my name and phone number to call.   Telephone Outgoing Call  Treatment Initiated Date 10/08/2019  Treatment Phase Treatment  Barriers/Navigation Needs Education  Education Other  Interventions Education  Acuity Level 2-Minimal Needs (1-2 Barriers Identified)  Education Method Verbal  Time Spent with Patient 15

## 2019-10-22 NOTE — Telephone Encounter (Signed)
Per another encounter sent by Dr. Valeta Harms, pt needs to have an appt prior to [place] refilling his meds for him. Pt has been scheduled for an appt today at 4:30 with Dr. Tamala Julian. Nothing further needed.

## 2019-10-22 NOTE — Telephone Encounter (Signed)
Called and spoke with pt about message received by Dr. Valeta Harms. Pt wanted to make an appt today with provider due to needing medications to help with his pain. Pt was scheduled for radiation tx today at 3:50 but stated due to the amount of pain he was in, he would not be able to lay down to receive the tx so wanted to go ahead and make OV today instead. I have scheduled pt an appt with Dr. Tamala Julian today at 4:30. Nothing further needed.

## 2019-10-22 NOTE — Telephone Encounter (Signed)
Requests refill for fentanyl ( 10/10/19 #10)  and dilaudid (10/10/19  # 30) .  "I am over her hurting" .   Prescribed by  Dr Valeta Harms . I instructed pt to contact Dr Valeta Harms for refills.   2/11 -pt was a no show to see [facility].

## 2019-10-22 NOTE — Telephone Encounter (Signed)
I think the best option for this patient is to come to the radiation oncology department to receive his palliative radiotherapy.  Without radiation he will continue to have significant pain and it will not be controlled with pain medications.  He will continue to require more and more pain medications if he does not receive treatment for his condition.  Radiation oncology can provide the patient with immediate release pain medication to help him lay on the table to receive his treatment.

## 2019-10-22 NOTE — Telephone Encounter (Signed)
I called to check on patient but was unable to reach. I did leave vm message to call back.

## 2019-10-22 NOTE — Telephone Encounter (Signed)
[place] noted.   Wyn Quaker FNP

## 2019-10-22 NOTE — Progress Notes (Signed)
Pulmonary Clinic Note 10/22/19  S: Here for acute visit for uncontrolled pain from lung cancer metastases. Went to radiation therapy today. On fentanyl patches, motrin, tylenol, dilaudid PRN breakthrough, dexamethasone, and flexeril. Method of re-prescribing these meds has not been established. Pain is in chest wall.   Last visit 10/10/19 Dr. Valeta Harms Ordered Patient was ordered 5x 75 mcg fentanyl patches, 30 tablets of 8 mg Dilaudid for breakthrough pain.  ROS + symptoms in bold Fevers, chills, weight loss Nausea, vomiting, diarrhea Shortness of breath, wheezing, cough Chest pain, palpitations, lower ext edema  O: Today's Vitals   10/22/19 1615  BP: 118/80  Pulse: (!) 109  Temp: (!) 97.2 F (36.2 C)  TempSrc: Temporal  SpO2: 98%  Weight: 160 lb (72.6 kg)  Height: 6' (1.829 m)   Body mass index is 21.7 kg/m. Thin man hunched over in pain Lungs with scattered rhonci Muscle wasting noted Ext warm, heart tachycardic Mentating well, AOx3  A: Metastatic cancer with opiate-responsive pain- less controlled when he ran out of meds Hx of polysubstance abuse- denies any in past few months; admits to this being reason why he needs such high doses  P: - Flexeril 10mg  TID x 1 mo, dilaudid 8mg  every 4 hours PRN (30 tabs written), fentanyl 57mcg patches x 5 - Continue dexamethasone - Working with Drs. Icard and Mohamed in finding way to get regular refills for this unfortunate man  Erskine Emery MD PCCM

## 2019-10-22 NOTE — Patient Instructions (Signed)
Pain medications refilled Fentanyl 69mcg every 3 days 8mg  dilaudid for breakthrough pain Flexeril for muscle spasms  This should be enough for another 15 days, we are working on a way to get this to you standing.

## 2019-10-22 NOTE — Telephone Encounter (Signed)
Thank you!    [place]

## 2019-10-22 NOTE — Telephone Encounter (Signed)
FYI,  patient should still have enough patches? He has an appt with rad onc today.  He will need to establish care with some form pain management  Our office will only prescribe additional opiates if he is seen in clinic.   CC: BM for possible visit in office this week or DS tomorrow?  FYI cancer patient with multiple boney mets.   [name], DO [facility] 10/22/2019 10:26 AM

## 2019-10-23 ENCOUNTER — Ambulatory Visit
Admission: RE | Admit: 2019-10-23 | Discharge: 2019-10-23 | Disposition: A | Discharge status: Home / Self Care | Payer: Medicaid Other | Source: Ambulatory Visit | Attending: Radiation Oncology | Admitting: Radiation Oncology

## 2019-10-23 ENCOUNTER — Ambulatory Visit: Payer: Self-pay | Admitting: Internal Medicine

## 2019-10-23 ENCOUNTER — Other Ambulatory Visit: Payer: Self-pay

## 2019-10-23 ENCOUNTER — Inpatient Hospital Stay: Payer: Medicaid Other

## 2019-10-23 ENCOUNTER — Inpatient Hospital Stay: Payer: Medicaid Other | Admitting: Physician Assistant

## 2019-10-23 DIAGNOSIS — Z510 Encounter for antineoplastic radiation therapy: Secondary | ICD-10-CM | POA: Diagnosis not present

## 2019-10-24 ENCOUNTER — Telehealth: Payer: Self-pay | Admitting: Radiation Oncology

## 2019-10-24 ENCOUNTER — Ambulatory Visit
Admission: RE | Admit: 2019-10-24 | Discharge: 2019-10-24 | Disposition: A | Discharge status: Home / Self Care | Payer: Medicaid Other | Source: Ambulatory Visit | Attending: Radiation Oncology | Admitting: Radiation Oncology

## 2019-10-24 ENCOUNTER — Encounter: Payer: Self-pay | Admitting: Physical Medicine and Rehabilitation

## 2019-10-24 ENCOUNTER — Other Ambulatory Visit: Payer: Self-pay

## 2019-10-24 ENCOUNTER — Ambulatory Visit (HOSPITAL_COMMUNITY): Payer: Self-pay

## 2019-10-24 ENCOUNTER — Telehealth: Payer: Self-pay | Admitting: Physician Assistant

## 2019-10-24 DIAGNOSIS — Z510 Encounter for antineoplastic radiation therapy: Secondary | ICD-10-CM | POA: Diagnosis not present

## 2019-10-24 NOTE — Telephone Encounter (Signed)
Per patient's request faxed 10/19/2019 letter to attorney, Rico Junker at [phone] and attorney, Derrill Kay at [phone]. Fax confirmation of delivery obtained for both.

## 2019-10-24 NOTE — Telephone Encounter (Signed)
Scheduled appt per 2/16 sch message - pt is aware of appt date and time

## 2019-10-25 ENCOUNTER — Other Ambulatory Visit: Payer: Self-pay | Admitting: *Deleted

## 2019-10-25 ENCOUNTER — Ambulatory Visit: Payer: Medicaid Other

## 2019-10-25 NOTE — Progress Notes (Signed)
The proposed treatment discussed in cancer conference is for discussion purpose only and is not a binding recommendation. The patient was not physically examined nor present for their treatment options. Therefore, final treatment plans cannot be decided.  ?

## 2019-10-26 ENCOUNTER — Other Ambulatory Visit: Payer: Self-pay

## 2019-10-26 ENCOUNTER — Ambulatory Visit
Admission: RE | Admit: 2019-10-26 | Discharge: 2019-10-26 | Disposition: A | Discharge status: Home / Self Care | Payer: Medicaid Other | Source: Ambulatory Visit | Attending: Radiation Oncology | Admitting: Radiation Oncology

## 2019-10-26 DIAGNOSIS — Z510 Encounter for antineoplastic radiation therapy: Secondary | ICD-10-CM | POA: Diagnosis not present

## 2019-10-29 ENCOUNTER — Other Ambulatory Visit: Payer: Self-pay | Admitting: Internal Medicine

## 2019-10-29 ENCOUNTER — Ambulatory Visit
Admission: RE | Admit: 2019-10-29 | Discharge: 2019-10-29 | Disposition: A | Discharge status: Home / Self Care | Payer: Medicaid Other | Source: Ambulatory Visit | Attending: Radiation Oncology | Admitting: Radiation Oncology

## 2019-10-29 ENCOUNTER — Inpatient Hospital Stay (HOSPITAL_BASED_OUTPATIENT_CLINIC_OR_DEPARTMENT_OTHER): Payer: Medicaid Other | Admitting: Physician Assistant

## 2019-10-29 ENCOUNTER — Inpatient Hospital Stay (HOSPITAL_BASED_OUTPATIENT_CLINIC_OR_DEPARTMENT_OTHER): Payer: Medicaid Other | Admitting: Internal Medicine

## 2019-10-29 ENCOUNTER — Other Ambulatory Visit: Payer: Self-pay

## 2019-10-29 ENCOUNTER — Inpatient Hospital Stay: Payer: Medicaid Other

## 2019-10-29 ENCOUNTER — Encounter: Payer: Self-pay | Admitting: Physician Assistant

## 2019-10-29 VITALS — BP 123/75 | HR 97 | Temp 97.2°F | Resp 19 | Ht 72.0 in | Wt 161.3 lb

## 2019-10-29 DIAGNOSIS — G893 Neoplasm related pain (acute) (chronic): Secondary | ICD-10-CM | POA: Diagnosis not present

## 2019-10-29 DIAGNOSIS — Z5112 Encounter for antineoplastic immunotherapy: Secondary | ICD-10-CM | POA: Insufficient documentation

## 2019-10-29 DIAGNOSIS — C3491 Malignant neoplasm of unspecified part of right bronchus or lung: Secondary | ICD-10-CM

## 2019-10-29 DIAGNOSIS — Z5111 Encounter for antineoplastic chemotherapy: Secondary | ICD-10-CM

## 2019-10-29 DIAGNOSIS — C7951 Secondary malignant neoplasm of bone: Secondary | ICD-10-CM | POA: Diagnosis not present

## 2019-10-29 DIAGNOSIS — G9389 Other specified disorders of brain: Secondary | ICD-10-CM

## 2019-10-29 DIAGNOSIS — H9042 Sensorineural hearing loss, unilateral, left ear, with unrestricted hearing on the contralateral side: Secondary | ICD-10-CM

## 2019-10-29 DIAGNOSIS — L089 Local infection of the skin and subcutaneous tissue, unspecified: Secondary | ICD-10-CM

## 2019-10-29 DIAGNOSIS — F17210 Nicotine dependence, cigarettes, uncomplicated: Secondary | ICD-10-CM | POA: Diagnosis not present

## 2019-10-29 DIAGNOSIS — Z7189 Other specified counseling: Secondary | ICD-10-CM

## 2019-10-29 DIAGNOSIS — F1110 Opioid abuse, uncomplicated: Secondary | ICD-10-CM

## 2019-10-29 DIAGNOSIS — Z510 Encounter for antineoplastic radiation therapy: Secondary | ICD-10-CM | POA: Diagnosis not present

## 2019-10-29 LAB — CBC WITH DIFFERENTIAL (CANCER CENTER ONLY)
Abs Immature Granulocytes: 0.05 10*3/uL (ref 0.00–0.07)
Basophils Absolute: 0 10*3/uL (ref 0.0–0.1)
Basophils Relative: 0 %
Eosinophils Absolute: 0.3 10*3/uL (ref 0.0–0.5)
Eosinophils Relative: 5 %
HCT: 37.8 % — ABNORMAL LOW (ref 39.0–52.0)
Hemoglobin: 12.7 g/dL — ABNORMAL LOW (ref 13.0–17.0)
Immature Granulocytes: 1 %
Lymphocytes Relative: 10 %
Lymphs Abs: 0.7 10*3/uL (ref 0.7–4.0)
MCH: 28.5 pg (ref 26.0–34.0)
MCHC: 33.6 g/dL (ref 30.0–36.0)
MCV: 84.8 fL (ref 80.0–100.0)
Monocytes Absolute: 0.6 10*3/uL (ref 0.1–1.0)
Monocytes Relative: 9 %
Neutro Abs: 5.4 10*3/uL (ref 1.7–7.7)
Neutrophils Relative %: 75 %
Platelet Count: 185 10*3/uL (ref 150–400)
RBC: 4.46 MIL/uL (ref 4.22–5.81)
RDW: 13.6 % (ref 11.5–15.5)
WBC Count: 7.1 10*3/uL (ref 4.0–10.5)
nRBC: 0 % (ref 0.0–0.2)

## 2019-10-29 LAB — CMP (CANCER CENTER ONLY)
ALT: 27 U/L (ref 0–44)
AST: 23 U/L (ref 15–41)
Albumin: 2.6 g/dL — ABNORMAL LOW (ref 3.5–5.0)
Alkaline Phosphatase: 140 U/L — ABNORMAL HIGH (ref 38–126)
Anion gap: 9 (ref 5–15)
BUN: 8 mg/dL (ref 6–20)
CO2: 28 mmol/L (ref 22–32)
Calcium: 8.7 mg/dL — ABNORMAL LOW (ref 8.9–10.3)
Chloride: 98 mmol/L (ref 98–111)
Creatinine: 0.56 mg/dL — ABNORMAL LOW (ref 0.61–1.24)
GFR, Est AFR Am: >60 mL/min (ref >60)
GFR, Estimated: >60 mL/min (ref >60)
Glucose, Bld: 94 mg/dL (ref 70–99)
Potassium: 3.4 mmol/L — ABNORMAL LOW (ref 3.5–5.1)
Sodium: 135 mmol/L (ref 135–145)
Total Bilirubin: 0.6 mg/dL (ref 0.3–1.2)
Total Protein: 7.2 g/dL (ref 6.5–8.1)

## 2019-10-29 MED ORDER — FENTANYL 75 MCG/HR TD PT72: 1.0000 | MEDICATED_PATCH | TRANSDERMAL | 0 refills | Status: DC

## 2019-10-29 MED ORDER — PROCHLORPERAZINE MALEATE 10 MG PO TABS: 10.0000 mg | ORAL_TABLET | Freq: Four times a day (QID) | ORAL | 2 refills | Status: DC | PRN

## 2019-10-29 MED ORDER — OXYCODONE-ACETAMINOPHEN 10-325 MG PO TABS: 1.0000 | ORAL_TABLET | Freq: Four times a day (QID) | ORAL | 0 refills | Status: DC | PRN

## 2019-10-29 MED ORDER — CEPHALEXIN 500 MG PO CAPS: 500.0000 mg | ORAL_CAPSULE | Freq: Four times a day (QID) | ORAL | 0 refills | Status: DC

## 2019-10-29 NOTE — Progress Notes (Signed)
START ON PATHWAY REGIMEN - Small Cell Lung     Cycles 1 through 4: A cycle is every 21 days:     Durvalumab      Carboplatin      Etoposide    Cycles 5 and beyond: A cycle is every 28 days:     Durvalumab   **Always confirm dose/schedule in your pharmacy ordering system**  Patient Characteristics: Newly Diagnosed, Preoperative or Nonsurgical Candidate (Clinical Staging), First Line, Extensive Stage Therapeutic Status: Newly Diagnosed, Preoperative or Nonsurgical Candidate (Clinical Staging) AJCC T Category: cT2a AJCC N Category: cN3 AJCC M Category: cM1c AJCC 8 Stage Grouping: IVB Stage Classification: Extensive Intent of Therapy: Non-Curative / Palliative Intent, Discussed with Patient

## 2019-10-29 NOTE — Patient Instructions (Addendum)
-  There are two main categories of lung cancer, they are named based on the size of the cancer cell. One is called Non-Small cell lung cancer. 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 Here are the the main points that were discussed at your office visit with [place] today:  -The treatment that you will receive consists of Two chemotherapy drugs, called Carboplatin and Etoposide and One Immunotherapy drug called Imfinzi (Durvalumab)  -We are planning on starting your treatment next week on 11/05/2019 but before your start your treatment, I would like you to attend a Chemotherapy Education Class. This involves having you sit down with one of our nurse educators. She will discuss with your one-on-one more details about your treatment as well as general information about resources here at the [facility] treatment will be given for three consecutive days every 3 weeks. You would need to come back for an injection 2 days after your 3rd day of treatment. The purpose of this injection is to boost up your infection fighting cells so that they do not drop too low as a side effect of chemotherapy. We will check your labs once a week for the first ~4 treatments just to make sure that important components of your blood are in an acceptable range -We will get a CT scan after 2 treatments to check on the progress of treatment  Medications:  -I have sent a few important medication prescriptions to your pharmacy.  -Compazine was sent to your pharmacy. This medication is for nausea. You may take this every 6 hours as needed if you feel nausous. .   Side Effects:  -The adverse effect of this treatment including but not limited to alopecia (losing your hair),  myelosuppression (drops in the blood counts), nausea and vomiting, peripheral neuropathy (numbness and tingling in the hands and feet), liver or renal dysfunction.    Imaging:  -Please get your PET scan as scheduled.   Follow up:  -We will see you back for a follow up visit in 2 weeks.

## 2019-10-29 NOTE — Progress Notes (Signed)
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  The patient presented with a right lower lobe lung mass in addition to right hilar and mediastinal lymphadenopathy as well as extensive metastatic bone disease, metastatic disease to the left jugular tubercle and jugular foramen, and lymphadenopathy in the abdomen.  He was diagnosed in January 2021.   PRIOR THERAPY: None  CURRENT THERAPY: 1) Palliative radiotherapy to the painful bone metastatic lesions under the care of Dr. Sondra Come 2) palliative systemic chemotherapy with carboplatin for an AUC of 5 on day 1, etoposide 100 mg/m on days 1, 2, and 3, and Imfinzi 1500 mg on day 1 IV every 3 weeks with Neulasta support.  First dose expected on 11/05/2019.  INTERVAL HISTORY: Nicolas Welch 49 y.o. male returns to the clinic for a follow-up visit accompanied by his wife.  The patient was recently diagnosed with metastatic lung cancer.  He is currently undergoing palliative radiotherapy to the painful bone metastases under the care of Dr. Sondra Come.  For pain, the patient is currently prescribed Percocet, 75 mcg of fentanyl, and Dilaudid.  The patient notes that he has not noticed significant relief in his pain with palliative radiotherapy.  The patient recently had his staging brain MRI which noted a 2.4 cm metastasis at the left jugular tubercle and jugular foramen. Per chart review, it appears his brain MRI findings were discussed with the patient on his 2/9 appointment with Dr. Sondra Come.   Today, the patient's main concern is reduced mobility due to his disease. He denies any recent fever, chills, or night sweats.  The patient is an IV drug user and notes that a few days ago he developed tenderness along the IV site in his left forearm.  The patient denies any  fluctuance, erythema, or fevers.  He states that this area is improving but is still somewhat tender to the touch.  He continues to report bilateral rib pain.  He reports mild shortness of breath with exertion and a mild cough with associated chest congstion.  He denies any hemoptysis.  He reports frequent nausea and vomiting.  He denies any diarrhea or constipation.  The patient's initial staging PET scan is scheduled for 2/26.  He was initially scheduled for 2/17 but the patient accidentally drank Gatorade prior to his PET scan.  The patient is here today to review his pathology results and for evaluation and recommendations regarding treatment options.  MEDICAL HISTORY: Past Medical History:  Diagnosis Date  . Anginal pain ([place])   . Bipolar disorder ([place])   . Cancer ([place])     " lung and metastatic bone cancer "  . Depression   . GERD (gastroesophageal reflux disease)   . Hepatitis    HEP C  . Lung mass     ALLERGIES:  is allergic to egg [eggs or egg-derived products]; milk-related compounds; and wheat bran.  MEDICATIONS:  Current Outpatient Medications  Medication Sig Dispense Refill  . cyclobenzaprine (FLEXERIL) 10 MG tablet Take 1 tablet (10 mg total) by mouth 3 (three) times daily as needed for muscle spasms. 90 tablet 0  . dexamethasone (DECADRON) 4 MG tablet Take 1 tablet (4 mg total) by mouth 3 (three) times daily. 45 tablet 0  . fentaNYL (DURAGESIC) 75 MCG/HR Place 1 patch onto the skin every 3 (three) days. 5 patch 0  .  HYDROmorphone (DILAUDID) 8 MG tablet Take 1 tablet (8 mg total) by mouth every 4 (four) hours as needed for severe pain. 30 tablet 0  . ibuprofen (ADVIL) 200 MG tablet Take 800 mg by mouth every 6 (six) hours as needed for moderate pain.     . cephALEXin (KEFLEX) 500 MG capsule Take 1 capsule (500 mg total) by mouth 4 (four) times daily. 20 capsule 0  . oxyCODONE-acetaminophen (PERCOCET) 10-325 MG tablet Take 1 tablet by mouth every 6 (six) hours as needed for  pain. 20 tablet 0  . prochlorperazine (COMPAZINE) 10 MG tablet Take 1 tablet (10 mg total) by mouth every 6 (six) hours as needed for nausea or vomiting. 30 tablet 2   No current facility-administered medications for this visit.    SURGICAL HISTORY:  Past Surgical History:  Procedure Laterality Date  . APPENDECTOMY    . BRONCHIAL BIOPSY  10/12/2019   Procedure: BRONCHIAL BIOPSIES;  Surgeon: [name], DO;  Location: [facility];  Service: Thoracic;;  . BRONCHIAL NEEDLE ASPIRATION BIOPSY  10/12/2019   Procedure: BRONCHIAL NEEDLE ASPIRATION BIOPSIES;  Surgeon: [name], DO;  Location: [facility];  Service: Thoracic;;  . BRONCHIAL WASHINGS  10/12/2019   Procedure: BRONCHIAL WASHINGS;  Surgeon: [name], DO;  Location: [facility];  Service: Thoracic;;  . TONSILLECTOMY AND ADENOIDECTOMY    . VIDEO BRONCHOSCOPY WITH ENDOBRONCHIAL ULTRASOUND N/A 10/12/2019   Procedure: VIDEO BRONCHOSCOPY WITH ENDOBRONCHIAL ULTRASOUND;  Surgeon: [name], DO;  Location: [facility];  Service: Thoracic;  Laterality: N/A;  . WISDOM TOOTH EXTRACTION      REVIEW OF SYSTEMS:   Review of Systems  Constitutional: Negative for appetite change, chills, fatigue, fever and unexpected weight change.  HENT: Negative for mouth sores, nosebleeds, sore throat and trouble swallowing.  Eyes: Negative for eye problems and icterus.  Respiratory: Positive for mild dyspnea on exertion and cough. Negative for  hemoptysis and wheezing.   Cardiovascular: Positive for chest/rib pain bilateral. Negative for leg swelling.  Gastrointestinal: Positive for nausea/vomiting. Negative for abdominal pain, constipation, and diarrhea. Genitourinary: Negative for bladder incontinence, difficulty urinating, dysuria, frequency and hematuria.   Musculoskeletal: Positive for back pain and decreased mobility. Negative for neck pain and neck stiffness.  Skin: Negative for itching and rash.  Neurological: Positive for  bilateral lower extremity weakness. Negative for dizziness, headaches, light-headedness and seizures.  Hematological: Negative for adenopathy. Does not bruise/bleed easily.  Psychiatric/Behavioral: Negative for confusion, depression and sleep disturbance. The patient is not nervous/anxious.     PHYSICAL EXAMINATION:  Blood pressure 123/75, pulse 97, temperature (!) 97.2 F (36.2 C), temperature source Temporal, resp. rate 19, height 6' (1.829 m), weight 161 lb 4.8 oz (73.2 kg), SpO2 96 %.  ECOG PERFORMANCE STATUS: 2 - Symptomatic, <50% confined to bed  Physical Exam  Constitutional: Oriented to person, place, and time and well-developed, well-nourished, and in no distress.  HENT:  Head: Normocephalic and atraumatic.  Mouth/Throat: Oropharynx is clear and moist. No oropharyngeal exudate.  Eyes: Conjunctivae are normal. Right eye exhibits no discharge. Left eye exhibits no discharge. No scleral icterus.  Neck: Normal range of motion. Neck supple.  Cardiovascular: Normal rate, regular rhythm, normal heart sounds and intact distal pulses.   Pulmonary/Chest: Effort normal and breath sounds normal. No respiratory distress. No wheezes. No rales.  Abdominal: Soft. Bowel sounds are normal. Exhibits no distension and no mass. There is no tenderness.  Musculoskeletal: Normal range of motion. Exhibits no edema.  Lymphadenopathy:  No cervical adenopathy.  Neurological: Alert and oriented to person, place, and time. Exhibits normal muscle tone. Gait normal. Coordination normal.  Skin: Skin is warm and dry. Firmness along vein in left forearm. No fluctuance, heat, or swelling. No rash noted. Not diaphoretic. No pallor.  Psychiatric: Mood, memory and judgment normal.  Vitals reviewed.  LABORATORY DATA: Lab Results  Component Value Date   WBC 7.1 10/29/2019   HGB 12.7 (L) 10/29/2019   HCT 37.8 (L) 10/29/2019   MCV 84.8 10/29/2019   PLT 185 10/29/2019      Chemistry      Component Value  Date/Time   NA 135 10/29/2019 1508   K 3.4 (L) 10/29/2019 1508   CL 98 10/29/2019 1508   CO2 28 10/29/2019 1508   BUN 8 10/29/2019 1508   CREATININE 0.56 (L) 10/29/2019 1508      Component Value Date/Time   CALCIUM 8.7 (L) 10/29/2019 1508   ALKPHOS 140 (H) 10/29/2019 1508   AST 23 10/29/2019 1508   ALT 27 10/29/2019 1508   BILITOT 0.6 10/29/2019 1508       RADIOGRAPHIC STUDIES:  CT Chest W Contrast  Result Date: 10/03/2019 CLINICAL DATA:  Metastatic bone disease seen on prior MRI. Evaluate for primary EXAM: CT CHEST, ABDOMEN, AND PELVIS WITH CONTRAST TECHNIQUE: Multidetector CT imaging of the chest, abdomen and pelvis was performed following the standard protocol during bolus administration of intravenous contrast. CONTRAST:  119mL OMNIPAQUE IOHEXOL 300 MG/ML  SOLN COMPARISON:  MRI thoracic spine and lumbar spine 09/25/2019 FINDINGS: CT CHEST FINDINGS Cardiovascular: Heart is normal size. Aorta is normal caliber. Scattered aortic and coronary artery calcifications. Mediastinum/Nodes: Necrotic mediastinal and right hilar adenoma. Right paratracheal lymph node has a short axis diameter of 16 mm. Necrotic subcarinal adenopathy has a short axis diameter of 19 mm. Necrotic right hilar lymph nodes have a short axis diameter of 15 mm. No axillary or left hilar adenopathy. Lungs/Pleura: Central necrotic appearing right lower lobe mass. This measures approximately 3.8 x 3.3 cm. Peripheral postobstructive atelectasis or pneumonia in the right lower lobe. No effusions. Musculoskeletal: Multiple bilateral lytic expansile rib lesions, the largest in the posterior left 8th rib measuring up to 4.2 cm on image 33. Expansile lytic lesions noted in the anterior right 1st and 3rd ribs. Lytic lesions noted within the T11 in the T5 vertebral bodies. CT ABDOMEN PELVIS FINDINGS Hepatobiliary: 8 mm low-density lesion in the inferior right hepatic lobe, nonspecific, too small to characterize. Gallbladder contracted.  Pancreas: No focal abnormality or ductal dilatation. Spleen: 5 mm low-density lesion within the superior pole of the spleen, nonspecific and too small to characterize. Adrenals/Urinary Tract: No adrenal abnormality. No focal renal abnormality. No stones or hydronephrosis. Urinary bladder is unremarkable. Stomach/Bowel: Stomach, large and small bowel grossly unremarkable. Vascular/Lymphatic: Aortic atherosclerosis. No aneurysm. Prominent porta hepatis lymph nodes. Portacaval lymph node has a short axis diameter of 12 mm on image 59. Reproductive: No visible focal abnormality. Other: No free fluid or free air. Musculoskeletal: Extensive lytic lesions seen throughout the lumbar spine, sacrum, right pubic bone and inferior pubic ramus and right iliac crest. IMPRESSION: Central right lower lobe necrotic appearing pulmonary mass measuring up to 3.8 cm. Findings concerning for lung cancer. Associated necrotic appearing right hilar and mediastinal adenopathy. Lytic destructive lesions within the ribs, thoracic and lumbar spine, and pelvis compatible with bony metastases. Small low-density lesions within the liver and spleen which are too small to characterize. Recommend attention on subsequent imaging. Mildly enlarged right upper  quadrant/porta hepatis lymph nodes. Aortic atherosclerosis. Electronically Signed   By: Rolm Baptise M.D.   On: 10/03/2019 01:37   MR Brain W Wo Contrast  Result Date: 10/16/2019 CLINICAL DATA:  Lung cancer staging EXAM: MRI HEAD WITHOUT AND WITH CONTRAST TECHNIQUE: Multiplanar, multiecho pulse sequences of the brain and surrounding structures were obtained without and with intravenous contrast. CONTRAST:  40mL GADAVIST GADOBUTROL 1 MMOL/ML IV SOLN COMPARISON:  None. FINDINGS: Brain: There is an area of nearly isointense masslike swelling at the left CP angle cistern measuring up to 2 cm, intra-axial. There is no detectable superimposed enhancement. No enhancing lesions seen throughout the brain.  Scattered FLAIR hyperintensities in the cerebral white matter, usually chronic small vessel ischemia. Brain volume is normal. No infarct, hydrocephalus, or extra-axial collection. Vascular: Normal intracranial flow voids. Diminished flow in the upper left internal jugular vein attributed to metastatic disease Skull and upper cervical spine: Avidly enhancing mass centered at the left jugular tubercle, measuring 24 mm. The mass encroaches on the jugular foramen. The left hypoglossal canal appears unaffected. Sinuses/Orbits: No evidence of metastatic disease. IMPRESSION: 1. 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 2. 2 cm intra-axial mass at the left CP angle, nearly isointense to brain and notably nonenhancing, the latter unexpected for size and tumor type. Incidental infiltrating glioma is a possibility. Electronically Signed   By: Monte Fantasia M.D.   On: 10/16/2019 06:06   CT ABDOMEN PELVIS W CONTRAST  Result Date: 10/03/2019 CLINICAL DATA:  Metastatic bone disease seen on prior MRI. Evaluate for primary EXAM: CT CHEST, ABDOMEN, AND PELVIS WITH CONTRAST TECHNIQUE: Multidetector CT imaging of the chest, abdomen and pelvis was performed following the standard protocol during bolus administration of intravenous contrast. CONTRAST:  168mL OMNIPAQUE IOHEXOL 300 MG/ML  SOLN COMPARISON:  MRI thoracic spine and lumbar spine 09/25/2019 FINDINGS: CT CHEST FINDINGS Cardiovascular: Heart is normal size. Aorta is normal caliber. Scattered aortic and coronary artery calcifications. Mediastinum/Nodes: Necrotic mediastinal and right hilar adenoma. Right paratracheal lymph node has a short axis diameter of 16 mm. Necrotic subcarinal adenopathy has a short axis diameter of 19 mm. Necrotic right hilar lymph nodes have a short axis diameter of 15 mm. No axillary or left hilar adenopathy. Lungs/Pleura: Central necrotic appearing right lower lobe mass. This measures approximately 3.8 x 3.3 cm.  Peripheral postobstructive atelectasis or pneumonia in the right lower lobe. No effusions. Musculoskeletal: Multiple bilateral lytic expansile rib lesions, the largest in the posterior left 8th rib measuring up to 4.2 cm on image 33. Expansile lytic lesions noted in the anterior right 1st and 3rd ribs. Lytic lesions noted within the T11 in the T5 vertebral bodies. CT ABDOMEN PELVIS FINDINGS Hepatobiliary: 8 mm low-density lesion in the inferior right hepatic lobe, nonspecific, too small to characterize. Gallbladder contracted. Pancreas: No focal abnormality or ductal dilatation. Spleen: 5 mm low-density lesion within the superior pole of the spleen, nonspecific and too small to characterize. Adrenals/Urinary Tract: No adrenal abnormality. No focal renal abnormality. No stones or hydronephrosis. Urinary bladder is unremarkable. Stomach/Bowel: Stomach, large and small bowel grossly unremarkable. Vascular/Lymphatic: Aortic atherosclerosis. No aneurysm. Prominent porta hepatis lymph nodes. Portacaval lymph node has a short axis diameter of 12 mm on image 59. Reproductive: No visible focal abnormality. Other: No free fluid or free air. Musculoskeletal: Extensive lytic lesions seen throughout the lumbar spine, sacrum, right pubic bone and inferior pubic ramus and right iliac crest. IMPRESSION: Central right lower lobe necrotic appearing pulmonary mass measuring  up to 3.8 cm. Findings concerning for lung cancer. Associated necrotic appearing right hilar and mediastinal adenopathy. Lytic destructive lesions within the ribs, thoracic and lumbar spine, and pelvis compatible with bony metastases. Small low-density lesions within the liver and spleen which are too small to characterize. Recommend attention on subsequent imaging. Mildly enlarged right upper quadrant/porta hepatis lymph nodes. Aortic atherosclerosis. Electronically Signed   By: Rolm Baptise M.D.   On: 10/03/2019 01:37   DG Femur 1 View Left  Result Date:  10/02/2019 CLINICAL DATA:  Evaluate for bony metastases EXAM: LEFT FEMUR 1 VIEW COMPARISON:  None. FINDINGS: There is no evidence of fracture or other focal bone lesions. Soft tissues are unremarkable. IMPRESSION: Negative. Electronically Signed   By: Rolm Baptise M.D.   On: 10/02/2019 23:07     ASSESSMENT/PLAN:  This is a very pleasant 49 year old Caucasian male diagnosed with extensive stage combination of small cell lung cancer and non-small cell lung cancer. The patient presented with a right lower lobe lung mass in addition to right hilar and mediastinal lymphadenopathy as well as extensive metastatic bone disease, metastatic disease to the left jugular tubercle and jugular foramen, and lymphadenopathy in the abdomen.  He was diagnosed in January 2021.  Pending initial staging PET scan.  The patient was seen with Dr. Julien Nordmann today.  Dr. Allegra Lai had a lengthy discussion with the patient about his current condition and treatment options.  Dr. Julien Nordmann recommended the patient undergo palliative systemic chemotherapy with carboplatin for an AUC of 5 on days 1, etoposide 100 mg/m on days 1, 2, and 3 and Imfinzi 1500 mg IV every 3 weeks with Neulasta support.  The patient is interested in this option and he is expected to receive his first dose on November 05, 2019.  The adverse side effects of treatment were discussed including but not limited to alopecia, myelosuppression, nausea, vomiting, liver and renal dysfunction as well as the adverse side effects of immunotherapy.  I will arrange for the patient to have a chemo education class prior to receiving his first cycle of chemotherapy.  I sent several prescriptions to the patient's pharmacy.  The patient will receive a prescription for Compazine 10 mg p.o. every 6 hours as needed for nausea.  Regarding the tenderness along the vein in the left forearm, this area does not appear infected at this time; however, given the concern for infections and  immunosuppression with chemotherapy I will send a prescription for Keflex 500 mg 4 times daily for 5 days to the patient's pharmacy.  I had a lengthy discussion with the patient about the risks of infection with chemotherapy and cautioned him that he needs to call our office immediately if he develops any signs and symptoms of infection in the future, including skin infections.  I have also sent a refill of Percocet to the patient's pharmacy.  Discussed that we will not be filling his prescription for Dilaudid.  The patient recently received a 15-day supply of his fentanyl patches and is not due for refill until 3/1.  I sent a refill to the patient's pharmacy with the note saying that it is ok to be refilled starting on 11/05/2019  We will see the patient back for follow-up visit in 2 weeks for evaluation and a 1 week follow-up visit after completing his first cycle of chemotherapy.  Dr. Julien Nordmann had a lengthy discussion with the patient that it is important to not skip any of his appointments.  He will continue to receive palliative  radiotherapy to the painful metastatic bone lesions under the care of Dr. Sondra Come.  The patient was advised to call immediately if he has any concerning symptoms in the interval. The patient voices understanding of current disease status and treatment options and is in agreement with the current care plan. All questions were answered. The patient knows to call the clinic with any problems, questions or concerns. We can certainly see the patient much sooner if necessary   Orders Placed This Encounter  Procedures  . CBC with Differential (Cancer Center Only)    Standing Status:   Standing    Number of Occurrences:   12    Standing Expiration Date:   10/28/2020  . CMP ([facility] only)    Standing Status:   Standing    Number of Occurrences:   12    Standing Expiration Date:   10/28/2020  . TSH    Standing Status:   Standing    Number of Occurrences:   4    Standing  Expiration Date:   10/28/2020     Nicolas Sos Kamea Dacosta, PA-C 10/29/19  ADDENDUM: Hematology/Oncology Attending: I had a face-to-face encounter with the patient today.  I recommended his care plan.  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 The patient is currently undergoing a course of palliative radiotherapy to the painful areas in the spine and brain. He continues to have significant pain all over his body.  He is currently on fentanyl patch 75 mcg/hour in addition to Percocet 10/325 every 6 hours as needed for pain.  He was also started recently on Dilaudid by Dr. Valeta Harms but it is not helping his pain.  The patient also has history of drug abuse and unfortunately he continues to do IV drug.  He has erythema in his arm from a recent site of IV drug injection. I had a lengthy discussion with the patient and his wife today about his current disease stage, prognosis and treatment options.  I explained to the patient that he has incurable condition and all the treatment will be of palliative nature. I gave the patient the option of palliative care and hospice referral versus consideration of palliative systemic chemotherapy targeting mainly the small cell lung cancer which is the most aggressive form of his disease and this regiment will also help with the non-small cell component of his malignancy.  The patient is interested in treatment.  I recommended for him treatment consisting of carboplatin for AUC of 5 on day 1, etoposide 100 mg/M2 on days 1, 2 and 3 with Neulasta support in addition to Imfinzi 1500 mg on day 1 every 3 weeks for the first 4 cycles and every 4 weeks as maintenance therapy after  cycle #4 if the patient has no evidence for disease progression by that time. I discussed with the patient the adverse effect of this treatment including but not limited to alopecia, myelosuppression, nausea and vomiting, peripheral neuropathy, liver or renal dysfunction as well as the adverse effect of the immunotherapy. The patient would like to proceed with the treatment as planned and he is expected to start the first cycle of this treatment next week.  We will arrange for the patient to have a  chemotherapy education class before the first dose of his treatment. For pain management he will continue with the palliative radiotherapy and I will refill his pain medication with fentanyl patch and Percocet but I will discontinue Dilaudid.  I also strongly encouraged the patient to quit drug abuse, otherwise I may not be able to refill his pain medication in the future. The patient will come back for follow-up visit in 2 weeks for evaluation and management of any adverse effect of his treatment. He was advised to call immediately if he has any concerning symptoms in the interval.  Disclaimer: This note was dictated with voice recognition software. Similar sounding words can inadvertently be transcribed and may be missed upon review. Eilleen Kempf, MD 10/29/19

## 2019-10-29 NOTE — Progress Notes (Signed)
[facility] at [address] [place], [place] [address] [phone]   New Patient Evaluation  Date of Service: 10/29/19 Patient Name: Nicolas Welch Patient MRN: [id number] Patient DOB: [birth year] Provider: Ventura Sellers, MD  Identifying Statement:  Nicolas Welch is a 49 y.o. male with left cp angle mass who presents for initial consultation and evaluation.    Referring Provider: Curt Bears, [place],  [place] [id number]  Oncologic History: Oncology History  Small cell carcinoma of right lung [facility])  10/29/2019 Initial Diagnosis   Small cell carcinoma of right lung ([place])   11/05/2019 -  Chemotherapy   The patient had palonosetron (ALOXI) injection 0.25 mg, 0.25 mg, Intravenous,  Once, 0 of 4 cycles pegfilgrastim-jmdb (FULPHILA) injection 6 mg, 6 mg, Subcutaneous,  Once, 0 of 4 cycles CARBOplatin (PARAPLATIN) 709.5 mg in sodium chloride 0.9 % 250 mL chemo infusion, 709.5 mg (original dose ), Intravenous,  Once, 0 of 4 cycles Dose modification: 709.5 mg (Cycle 1) etoposide (VEPESID) 190 mg in sodium chloride 0.9 % 500 mL chemo infusion, 100 mg/m2 = 190 mg, Intravenous,  Once, 0 of 4 cycles fosaprepitant (EMEND) 150 mg in sodium chloride 0.9 % 145 mL IVPB, 150 mg, Intravenous,  Once, 0 of 4 cycles durvalumab (IMFINZI) 1,500 mg in sodium chloride 0.9 % 100 mL chemo infusion, 1,500 mg, Intravenous,  Once, 0 of 8 cycles  for chemotherapy treatment.      History of Present Illness: The patient's records from the referring physician were obtained and reviewed and the patient interviewed to confirm this HPI.  Nicolas Welch presents for evaluation after incidental finding on lung cancer screening brain MRI.  He describes "pain all over" and poor function of legs, left leg in particular, because of back and leg pain.  He ambulates around the home with a cane and sometimes additional assistance.  He does  acknowledge some decreased hearing in the left ear, 20% compared to the right.  It's unclear when he first noticed this change.  Otherwise denies seizures, headaches.  Medications: Current Outpatient Medications on File Prior to Visit  Medication Sig Dispense Refill  . cyclobenzaprine (FLEXERIL) 10 MG tablet Take 1 tablet (10 mg total) by mouth 3 (three) times daily as needed for muscle spasms. 90 tablet 0  . dexamethasone (DECADRON) 4 MG tablet Take 1 tablet (4 mg total) by mouth 3 (three) times daily. 45 tablet 0  . fentaNYL (DURAGESIC) 75 MCG/HR Place 1 patch onto the skin every 3 (three) days. 5 patch 0  . HYDROmorphone (DILAUDID) 8 MG tablet Take 1 tablet (8 mg total) by mouth every 4 (four) hours as needed for severe pain. 30 tablet 0  . ibuprofen (ADVIL) 200 MG tablet Take 800 mg by mouth every 6 (six) hours as needed for moderate pain.     Marland Kitchen oxyCODONE-acetaminophen (PERCOCET) 10-325 MG tablet Take 1 tablet by mouth every 6 (six) hours as needed for pain. (Patient not taking: Reported on 10/29/2019) 20 tablet 0   No current facility-administered medications on file prior to visit.    Allergies:  Allergies  Allergen Reactions  . Egg [Eggs Or Egg-Derived Products] Hives, Itching and Swelling  . Milk-Related Compounds Hives, Itching and Swelling  . Wheat Bran Hives, Itching and Swelling   Past Medical History:  Past Medical History:  Diagnosis Date  . Anginal pain ([place])   . Bipolar disorder ([place])   . Cancer ([place])     "  lung and metastatic bone cancer "  . Depression   . GERD (gastroesophageal reflux disease)   . Hepatitis    HEP C  . Lung mass    Past Surgical History:  Past Surgical History:  Procedure Laterality Date  . APPENDECTOMY    . BRONCHIAL BIOPSY  10/12/2019   Procedure: BRONCHIAL BIOPSIES;  Surgeon: [name], DO;  Location: [facility];  Service: Thoracic;;  . BRONCHIAL NEEDLE ASPIRATION BIOPSY  10/12/2019   Procedure: BRONCHIAL NEEDLE ASPIRATION BIOPSIES;   Surgeon: [name], DO;  Location: [facility];  Service: Thoracic;;  . BRONCHIAL WASHINGS  10/12/2019   Procedure: BRONCHIAL WASHINGS;  Surgeon: [name], DO;  Location: [facility];  Service: Thoracic;;  . TONSILLECTOMY AND ADENOIDECTOMY    . VIDEO BRONCHOSCOPY WITH ENDOBRONCHIAL ULTRASOUND N/A 10/12/2019   Procedure: VIDEO BRONCHOSCOPY WITH ENDOBRONCHIAL ULTRASOUND;  Surgeon: [name], DO;  [facility];  Service: Thoracic;  Laterality: N/A;  . WISDOM TOOTH EXTRACTION     Social History:  Social History   Socioeconomic History  . Marital status: Legally Separated    Spouse name: Not on file  . Number of children: Not on file  . Years of education: Not on file  . Highest education level: Not on file  Occupational History  . Not on file  Tobacco Use  . Smoking status: Current Some Day Smoker    Packs/day: 0.25    Types: Cigarettes  . Smokeless tobacco: Never Used  . Tobacco comment: 1 cigarette every 2-3 days  Substance and Sexual Activity  . Alcohol use: No  . 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 Sexual activity: Not Currently  Other Topics Concern  . Not on file  Social History Narrative  . Not on file   Social Determinants of Health   Financial Resource Strain:   . Difficulty of Paying Living Expenses: Not on file  Food Insecurity:   . Worried About Charity fundraiser in the Last Year: Not on file  . Ran Out of Food in the Last Year: Not on file  Transportation Needs:   . Lack of Transportation (Medical): Not on file  . Lack of Transportation (Non-Medical): Not on file  Physical Activity:   . Days of Exercise per Week: Not on file  . Minutes of Exercise per Session: Not on file  Stress:   . Feeling of Stress : Not on file  Social Connections:   . Frequency of Communication with Friends and Family: Not on file  . Frequency of Social Gatherings with Friends and Family: Not on file  .  Attends Religious Services: Not on file  . Active Member of Clubs or Organizations: Not on file  . Attends Archivist Meetings: Not on file  . Marital Status: Not on file  Intimate Partner Violence:   . Fear of Current or Ex-Partner: Not on file  . Emotionally Abused: Not on file  . Physically Abused: Not on file  . Sexually Abused: Not on file   Family History: No family history on file.  Review of Systems: Constitutional: Doesn't report fevers, chills or abnormal weight loss Eyes: Doesn't report blurriness of vision Ears, nose, mouth, throat, and face: Doesn't report sore throat Respiratory: Doesn't report cough, dyspnea or wheezes Cardiovascular: Doesn't report palpitation, chest discomfort  Gastrointestinal:  Doesn't report nausea, constipation, diarrhea GU: Doesn't report incontinence Skin: Doesn't report skin  rashes Neurological: Per HPI Musculoskeletal: Diffuse pain, back pain Behavioral/Psych: Doesn't report anxiety  Physical Exam: There were no vitals filed for this visit. KPS: 60. General: Thin appearing, in wheelchair, in no acute distress Head: Normal EENT: No conjunctival injection or scleral icterus.  Lungs: Resp effort normal Cardiac: Regular rate Abdomen: Non-distended abdomen Skin: No rashes cyanosis or petechiae. Extremities: No clubbing or edema  Neurologic Exam: Mental Status: Awake, alert, attentive to examiner. Oriented to self and environment. Language is fluent with intact comprehension.  Cranial Nerves: Visual acuity is grossly normal. Visual fields are full. Extra-ocular movements intact. No ptosis. Face is symmetric.  Decreased audio  Motor: Tone and bulk are normal. Leg motor exam limited by pain with movement in legs.  Arms are antigravity. Reflexes are symmetric, no pathologic reflexes present.  Sensory: Intact to light touch Gait: Deferred due to pain limitation   Labs: I have reviewed the data as listed    Component Value  Date/Time   NA 135 10/29/2019 1508   K 3.4 (L) 10/29/2019 1508   CL 98 10/29/2019 1508   CO2 28 10/29/2019 1508   GLUCOSE 94 10/29/2019 1508   BUN 8 10/29/2019 1508   CREATININE 0.56 (L) 10/29/2019 1508   CALCIUM 8.7 (L) 10/29/2019 1508   PROT 7.2 10/29/2019 1508   ALBUMIN 2.6 (L) 10/29/2019 1508   AST 23 10/29/2019 1508   ALT 27 10/29/2019 1508   ALKPHOS 140 (H) 10/29/2019 1508   BILITOT 0.6 10/29/2019 1508   GFRNONAA >60 10/29/2019 1508   GFRAA >60 10/29/2019 1508   Lab Results  Component Value Date   WBC 7.1 10/29/2019   NEUTROABS 5.4 10/29/2019   HGB 12.7 (L) 10/29/2019   HCT 37.8 (L) 10/29/2019   MCV 84.8 10/29/2019   PLT 185 10/29/2019    Imaging:  CT Chest W Contrast  Result Date: 10/03/2019 CLINICAL DATA:  Metastatic bone disease seen on prior MRI. Evaluate for primary EXAM: CT CHEST, ABDOMEN, AND PELVIS WITH CONTRAST TECHNIQUE: Multidetector CT imaging of the chest, abdomen and pelvis was performed following the standard protocol during bolus administration of intravenous contrast. CONTRAST:  189mL OMNIPAQUE IOHEXOL 300 MG/ML  SOLN COMPARISON:  MRI thoracic spine and lumbar spine 09/25/2019 FINDINGS: CT CHEST FINDINGS Cardiovascular: Heart is normal size. Aorta is normal caliber. Scattered aortic and coronary artery calcifications. Mediastinum/Nodes: Necrotic mediastinal and right hilar adenoma. Right paratracheal lymph node has a short axis diameter of 16 mm. Necrotic subcarinal adenopathy has a short axis diameter of 19 mm. Necrotic right hilar lymph nodes have a short axis diameter of 15 mm. No axillary or left hilar adenopathy. Lungs/Pleura: Central necrotic appearing right lower lobe mass. This measures approximately 3.8 x 3.3 cm. Peripheral postobstructive atelectasis or pneumonia in the right lower lobe. No effusions. Musculoskeletal: Multiple bilateral lytic expansile rib lesions, the largest in the posterior left 8th rib measuring up to 4.2 cm on image 33. Expansile  lytic lesions noted in the anterior right 1st and 3rd ribs. Lytic lesions noted within the T11 in the T5 vertebral bodies. CT ABDOMEN PELVIS FINDINGS Hepatobiliary: 8 mm low-density lesion in the inferior right hepatic lobe, nonspecific, too small to characterize. Gallbladder contracted. Pancreas: No focal abnormality or ductal dilatation. Spleen: 5 mm low-density lesion within the superior pole of the spleen, nonspecific and too small to characterize. Adrenals/Urinary Tract: No adrenal abnormality. No focal renal abnormality. No stones or hydronephrosis. Urinary bladder is unremarkable. Stomach/Bowel: Stomach, large and small bowel grossly unremarkable. Vascular/Lymphatic: Aortic atherosclerosis. No  aneurysm. Prominent porta hepatis lymph nodes. Portacaval lymph node has a short axis diameter of 12 mm on image 59. Reproductive: No visible focal abnormality. Other: No free fluid or free air. Musculoskeletal: Extensive lytic lesions seen throughout the lumbar spine, sacrum, right pubic bone and inferior pubic ramus and right iliac crest. IMPRESSION: Central right lower lobe necrotic appearing pulmonary mass measuring up to 3.8 cm. Findings concerning for lung cancer. Associated necrotic appearing right hilar and mediastinal adenopathy. Lytic destructive lesions within the ribs, thoracic and lumbar spine, and pelvis compatible with bony metastases. Small low-density lesions within the liver and spleen which are too small to characterize. Recommend attention on subsequent imaging. Mildly enlarged right upper quadrant/porta hepatis lymph nodes. Aortic atherosclerosis. Electronically Signed   By: Rolm Baptise M.D.   On: 10/03/2019 01:37   MR Brain W Wo Contrast  Result Date: 10/16/2019 CLINICAL DATA:  Lung cancer staging EXAM: MRI HEAD WITHOUT AND WITH CONTRAST TECHNIQUE: Multiplanar, multiecho pulse sequences of the brain and surrounding structures were obtained without and with intravenous contrast. CONTRAST:  26mL  GADAVIST GADOBUTROL 1 MMOL/ML IV SOLN COMPARISON:  None. FINDINGS: Brain: There is an area of nearly isointense masslike swelling at the left CP angle cistern measuring up to 2 cm, intra-axial. There is no detectable superimposed enhancement. No enhancing lesions seen throughout the brain. Scattered FLAIR hyperintensities in the cerebral white matter, usually chronic small vessel ischemia. Brain volume is normal. No infarct, hydrocephalus, or extra-axial collection. Vascular: Normal intracranial flow voids. Diminished flow in the upper left internal jugular vein attributed to metastatic disease Skull and upper cervical spine: Avidly enhancing mass centered at the left jugular tubercle, measuring 24 mm. The mass encroaches on the jugular foramen. The left hypoglossal canal appears unaffected. Sinuses/Orbits: No evidence of metastatic disease. IMPRESSION: 1. 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 2. 2 cm intra-axial mass at the left CP angle, nearly isointense to brain and notably nonenhancing, the latter unexpected for size and tumor type. Incidental infiltrating glioma is a possibility. Electronically Signed   By: Monte Fantasia M.D.   On: 10/16/2019 06:06   CT ABDOMEN PELVIS W CONTRAST  Result Date: 10/03/2019 CLINICAL DATA:  Metastatic bone disease seen on prior MRI. Evaluate for primary EXAM: CT CHEST, ABDOMEN, AND PELVIS WITH CONTRAST TECHNIQUE: Multidetector CT imaging of the chest, abdomen and pelvis was performed following the standard protocol during bolus administration of intravenous contrast. CONTRAST:  175mL OMNIPAQUE IOHEXOL 300 MG/ML  SOLN COMPARISON:  MRI thoracic spine and lumbar spine 09/25/2019 FINDINGS: CT CHEST FINDINGS Cardiovascular: Heart is normal size. Aorta is normal caliber. Scattered aortic and coronary artery calcifications. Mediastinum/Nodes: Necrotic mediastinal and right hilar adenoma. Right paratracheal lymph node has a short axis diameter  of 16 mm. Necrotic subcarinal adenopathy has a short axis diameter of 19 mm. Necrotic right hilar lymph nodes have a short axis diameter of 15 mm. No axillary or left hilar adenopathy. Lungs/Pleura: Central necrotic appearing right lower lobe mass. This measures approximately 3.8 x 3.3 cm. Peripheral postobstructive atelectasis or pneumonia in the right lower lobe. No effusions. Musculoskeletal: Multiple bilateral lytic expansile rib lesions, the largest in the posterior left 8th rib measuring up to 4.2 cm on image 33. Expansile lytic lesions noted in the anterior right 1st and 3rd ribs. Lytic lesions noted within the T11 in the T5 vertebral bodies. CT ABDOMEN PELVIS FINDINGS Hepatobiliary: 8 mm low-density lesion in the inferior right hepatic lobe, nonspecific, too small to characterize.  Gallbladder contracted. Pancreas: No focal abnormality or ductal dilatation. Spleen: 5 mm low-density lesion within the superior pole of the spleen, nonspecific and too small to characterize. Adrenals/Urinary Tract: No adrenal abnormality. No focal renal abnormality. No stones or hydronephrosis. Urinary bladder is unremarkable. Stomach/Bowel: Stomach, large and small bowel grossly unremarkable. Vascular/Lymphatic: Aortic atherosclerosis. No aneurysm. Prominent porta hepatis lymph nodes. Portacaval lymph node has a short axis diameter of 12 mm on image 59. Reproductive: No visible focal abnormality. Other: No free fluid or free air. Musculoskeletal: Extensive lytic lesions seen throughout the lumbar spine, sacrum, right pubic bone and inferior pubic ramus and right iliac crest. IMPRESSION: Central right lower lobe necrotic appearing pulmonary mass measuring up to 3.8 cm. Findings concerning for lung cancer. Associated necrotic appearing right hilar and mediastinal adenopathy. Lytic destructive lesions within the ribs, thoracic and lumbar spine, and pelvis compatible with bony metastases. Small low-density lesions within the liver  and spleen which are too small to characterize. Recommend attention on subsequent imaging. Mildly enlarged right upper quadrant/porta hepatis lymph nodes. Aortic atherosclerosis. Electronically Signed   By: Rolm Baptise M.D.   On: 10/03/2019 01:37   DG Femur 1 View Left  Result Date: 10/02/2019 CLINICAL DATA:  Evaluate for bony metastases EXAM: LEFT FEMUR 1 VIEW COMPARISON:  None. FINDINGS: There is no evidence of fracture or other focal bone lesions. Soft tissues are unremarkable. IMPRESSION: Negative. Electronically Signed   By: Rolm Baptise M.D.   On: 10/02/2019 23:07     Assessment/Plan Left CP Angle Mass  Mr. 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  Lack of contrast enhancement is very atypical, and could suggest alternate etiology such as meningioma, astrocytoma.  Location and lack of enhancement are not consistent with metastasis.   Because of tumor location, auditory complaints, we recommended formal audiologic evaluation for baseline assessment.    He should return to clinic in 2 months with repeat brain MRI for evaluation, or sooner if needed.  We appreciate the opportunity to participate in the care of Nicolas Welch.   Screening for potential clinical trials was performed and discussed using eligibility criteria for active protocols at [facility], loco-regional tertiary centers, as well as national database available on [email or web address].com.    The patient is not a candidate for a research protocol at this time due to no suitable study identified.   We spent twenty additional minutes teaching regarding the natural history, biology, and historical experience in the treatment of brain tumors. We then discussed in detail the current recommendations for therapy focusing on the mode of administration, mechanism of action, anticipated toxicities, and quality of life issues associated with this plan. We also provided  teaching sheets for the patient to take home as an additional resource.  All questions were answered. The patient knows to call the clinic with any problems, questions or concerns. No barriers to learning were detected.  The total time spent in the encounter was 40 minutes and more than 50% was on counseling and review of test results   Ventura Sellers, MD Medical Director of Neuro-Oncology [facility] at [place] 10/29/19 4:34 PM

## 2019-10-30 ENCOUNTER — Telehealth: Payer: Self-pay | Admitting: Internal Medicine

## 2019-10-30 ENCOUNTER — Ambulatory Visit
Admission: RE | Admit: 2019-10-30 | Discharge: 2019-10-30 | Disposition: A | Discharge status: Home / Self Care | Payer: Medicaid Other | Source: Ambulatory Visit | Attending: Radiation Oncology | Admitting: Radiation Oncology

## 2019-10-30 ENCOUNTER — Other Ambulatory Visit: Payer: Self-pay | Admitting: Radiation Oncology

## 2019-10-30 DIAGNOSIS — Z510 Encounter for antineoplastic radiation therapy: Secondary | ICD-10-CM | POA: Diagnosis not present

## 2019-10-30 MED ORDER — SUCRALFATE 1 G PO TABS: 1.0000 g | ORAL_TABLET | Freq: Three times a day (TID) | ORAL | 1 refills | Status: DC

## 2019-10-30 NOTE — Telephone Encounter (Signed)
Scheduled appt per 2/11 los.  Sent a message to HIM pool to get a calendar mailed out.

## 2019-10-30 NOTE — Telephone Encounter (Signed)
Scheduled appts per los. Called and spoke with patient and wife. Confirmed appts. Gave direct number in case changes needed to be made to appts .

## 2019-10-31 ENCOUNTER — Other Ambulatory Visit: Payer: Self-pay

## 2019-10-31 ENCOUNTER — Ambulatory Visit
Admission: RE | Admit: 2019-10-31 | Discharge: 2019-10-31 | Disposition: A | Discharge status: Home / Self Care | Payer: Medicaid Other | Source: Ambulatory Visit | Attending: Radiation Oncology | Admitting: Radiation Oncology

## 2019-10-31 DIAGNOSIS — Z510 Encounter for antineoplastic radiation therapy: Secondary | ICD-10-CM | POA: Diagnosis not present

## 2019-11-01 ENCOUNTER — Ambulatory Visit: Payer: Medicaid Other

## 2019-11-01 ENCOUNTER — Ambulatory Visit: Admission: RE | Admit: 2019-11-01 | Payer: Medicaid Other | Source: Ambulatory Visit

## 2019-11-02 ENCOUNTER — Inpatient Hospital Stay: Payer: Medicaid Other | Admitting: Internal Medicine

## 2019-11-02 ENCOUNTER — Telehealth: Payer: Self-pay | Admitting: General Practice

## 2019-11-02 ENCOUNTER — Other Ambulatory Visit: Payer: Self-pay

## 2019-11-02 ENCOUNTER — Inpatient Hospital Stay: Payer: Medicaid Other

## 2019-11-02 ENCOUNTER — Ambulatory Visit
Admission: RE | Admit: 2019-11-02 | Discharge: 2019-11-02 | Disposition: A | Discharge status: Home / Self Care | Payer: Medicaid Other | Source: Ambulatory Visit | Attending: Radiation Oncology | Admitting: Radiation Oncology

## 2019-11-02 ENCOUNTER — Encounter: Payer: Self-pay | Admitting: Internal Medicine

## 2019-11-02 ENCOUNTER — Inpatient Hospital Stay (HOSPITAL_BASED_OUTPATIENT_CLINIC_OR_DEPARTMENT_OTHER): Payer: Medicaid Other | Admitting: *Deleted

## 2019-11-02 ENCOUNTER — Encounter (HOSPITAL_COMMUNITY)
Admission: RE | Admit: 2019-11-02 | Discharge: 2019-11-02 | Disposition: A | Discharge status: Home / Self Care | Payer: Medicaid Other | Source: Ambulatory Visit | Attending: Internal Medicine | Admitting: Internal Medicine

## 2019-11-02 ENCOUNTER — Ambulatory Visit: Payer: Medicaid Other

## 2019-11-02 DIAGNOSIS — C3490 Malignant neoplasm of unspecified part of unspecified bronchus or lung: Secondary | ICD-10-CM

## 2019-11-02 DIAGNOSIS — C7951 Secondary malignant neoplasm of bone: Secondary | ICD-10-CM | POA: Diagnosis not present

## 2019-11-02 DIAGNOSIS — Z510 Encounter for antineoplastic radiation therapy: Secondary | ICD-10-CM | POA: Diagnosis not present

## 2019-11-02 DIAGNOSIS — C3491 Malignant neoplasm of unspecified part of right bronchus or lung: Secondary | ICD-10-CM

## 2019-11-02 LAB — GLUCOSE, CAPILLARY: Glucose-Capillary: 97 mg/dL (ref 70–99)

## 2019-11-02 MED ORDER — FLUDEOXYGLUCOSE F - 18 (FDG) INJECTION
8.1700 | Freq: Once | INTRAVENOUS | Status: AC | PRN
  Administered 2019-11-02: 11:00:00 8.17 via INTRAVENOUS

## 2019-11-02 NOTE — Progress Notes (Signed)
Met with patient at registration to introduce myself as Arboriculturist and to offer available resources.  Discussed one-time $1000 Radio broadcast assistant to assist with personal expenses while going through treatment. Patient was dosing off.  Gave him my card if interested in applying and for any additional financial questions or concerns.  47 M on hospital side has been trying to reach patient regarding Medicaid app follow up. Provided her card as well for him to contact her directly.

## 2019-11-02 NOTE — Telephone Encounter (Signed)
[facility] CSW Progress Notes  Request from Juanda Bond, nurse navigator, to call patient and determine if social work can provide any additional suport to patient as he has missed appointments and has other psychosocial issues which may be impacting his care.  Call placed to home and mobile numbers, no answer.  VM left where possible w contact information and encouragement to call back for support/resources.   Edwyna Shell, LCSW Clinical Social Worker Phone:  [phone] Cell:  [phone]

## 2019-11-05 ENCOUNTER — Other Ambulatory Visit: Payer: Self-pay

## 2019-11-05 ENCOUNTER — Inpatient Hospital Stay: Payer: Medicaid Other

## 2019-11-05 ENCOUNTER — Ambulatory Visit: Admission: RE | Admit: 2019-11-05 | Payer: Medicaid Other | Source: Ambulatory Visit

## 2019-11-05 ENCOUNTER — Inpatient Hospital Stay: Payer: Medicaid Other | Attending: Internal Medicine

## 2019-11-05 ENCOUNTER — Encounter: Payer: Self-pay | Admitting: *Deleted

## 2019-11-05 ENCOUNTER — Ambulatory Visit
Admission: RE | Admit: 2019-11-05 | Discharge: 2019-11-05 | Disposition: A | Discharge status: Home / Self Care | Payer: Medicaid Other | Source: Ambulatory Visit | Attending: Radiation Oncology | Admitting: Radiation Oncology

## 2019-11-05 VITALS — BP 112/73 | HR 108 | Temp 98.9°F | Resp 18

## 2019-11-05 DIAGNOSIS — C787 Secondary malignant neoplasm of liver and intrahepatic bile duct: Secondary | ICD-10-CM | POA: Insufficient documentation

## 2019-11-05 DIAGNOSIS — R197 Diarrhea, unspecified: Secondary | ICD-10-CM | POA: Insufficient documentation

## 2019-11-05 DIAGNOSIS — C7951 Secondary malignant neoplasm of bone: Secondary | ICD-10-CM | POA: Diagnosis not present

## 2019-11-05 DIAGNOSIS — F319 Bipolar disorder, unspecified: Secondary | ICD-10-CM | POA: Insufficient documentation

## 2019-11-05 DIAGNOSIS — J449 Chronic obstructive pulmonary disease, unspecified: Secondary | ICD-10-CM | POA: Insufficient documentation

## 2019-11-05 DIAGNOSIS — C3491 Malignant neoplasm of unspecified part of right bronchus or lung: Secondary | ICD-10-CM

## 2019-11-05 DIAGNOSIS — Z5111 Encounter for antineoplastic chemotherapy: Secondary | ICD-10-CM | POA: Insufficient documentation

## 2019-11-05 DIAGNOSIS — Z79899 Other long term (current) drug therapy: Secondary | ICD-10-CM | POA: Insufficient documentation

## 2019-11-05 DIAGNOSIS — Z5189 Encounter for other specified aftercare: Secondary | ICD-10-CM | POA: Diagnosis not present

## 2019-11-05 DIAGNOSIS — Z510 Encounter for antineoplastic radiation therapy: Secondary | ICD-10-CM | POA: Insufficient documentation

## 2019-11-05 DIAGNOSIS — C771 Secondary and unspecified malignant neoplasm of intrathoracic lymph nodes: Secondary | ICD-10-CM | POA: Diagnosis not present

## 2019-11-05 DIAGNOSIS — C3431 Malignant neoplasm of lower lobe, right bronchus or lung: Secondary | ICD-10-CM | POA: Diagnosis present

## 2019-11-05 DIAGNOSIS — Z5112 Encounter for antineoplastic immunotherapy: Secondary | ICD-10-CM | POA: Insufficient documentation

## 2019-11-05 DIAGNOSIS — R110 Nausea: Secondary | ICD-10-CM | POA: Diagnosis not present

## 2019-11-05 LAB — CMP (CANCER CENTER ONLY)
ALT: 21 U/L (ref 0–44)
AST: 25 U/L (ref 15–41)
Albumin: 2.5 g/dL — ABNORMAL LOW (ref 3.5–5.0)
Alkaline Phosphatase: 132 U/L — ABNORMAL HIGH (ref 38–126)
Anion gap: 8 (ref 5–15)
BUN: 7 mg/dL (ref 6–20)
CO2: 27 mmol/L (ref 22–32)
Calcium: 9.2 mg/dL (ref 8.9–10.3)
Chloride: 101 mmol/L (ref 98–111)
Creatinine: 0.63 mg/dL (ref 0.61–1.24)
GFR, Est AFR Am: >60 mL/min (ref >60)
GFR, Estimated: >60 mL/min (ref >60)
Glucose, Bld: 121 mg/dL — ABNORMAL HIGH (ref 70–99)
Potassium: 3.8 mmol/L (ref 3.5–5.1)
Sodium: 136 mmol/L (ref 135–145)
Total Bilirubin: 0.4 mg/dL (ref 0.3–1.2)
Total Protein: 7.8 g/dL (ref 6.5–8.1)

## 2019-11-05 LAB — CBC WITH DIFFERENTIAL (CANCER CENTER ONLY)
Abs Immature Granulocytes: 0.18 10*3/uL — ABNORMAL HIGH (ref 0.00–0.07)
Basophils Absolute: 0.1 10*3/uL (ref 0.0–0.1)
Basophils Relative: 1 %
Eosinophils Absolute: 0.1 10*3/uL (ref 0.0–0.5)
Eosinophils Relative: 1 %
HCT: 38.2 % — ABNORMAL LOW (ref 39.0–52.0)
Hemoglobin: 12.7 g/dL — ABNORMAL LOW (ref 13.0–17.0)
Immature Granulocytes: 3 %
Lymphocytes Relative: 8 %
Lymphs Abs: 0.4 10*3/uL — ABNORMAL LOW (ref 0.7–4.0)
MCH: 28 pg (ref 26.0–34.0)
MCHC: 33.2 g/dL (ref 30.0–36.0)
MCV: 84.3 fL (ref 80.0–100.0)
Monocytes Absolute: 0.5 10*3/uL (ref 0.1–1.0)
Monocytes Relative: 9 %
Neutro Abs: 4.6 10*3/uL (ref 1.7–7.7)
Neutrophils Relative %: 78 %
Platelet Count: 160 10*3/uL (ref 150–400)
RBC: 4.53 MIL/uL (ref 4.22–5.81)
RDW: 13.5 % (ref 11.5–15.5)
WBC Count: 5.9 10*3/uL (ref 4.0–10.5)
nRBC: 0 % (ref 0.0–0.2)

## 2019-11-05 LAB — TSH: TSH: 0.445 u[IU]/mL (ref 0.320–4.118)

## 2019-11-05 MED ORDER — PALONOSETRON HCL INJECTION 0.25 MG/5ML
INTRAVENOUS | Status: AC
  Filled 2019-11-05: qty 5

## 2019-11-05 MED ORDER — SODIUM CHLORIDE 0.9 % IV SOLN
Freq: Once | INTRAVENOUS | Status: AC
  Filled 2019-11-05: qty 250

## 2019-11-05 MED ORDER — SODIUM CHLORIDE 0.9 % IV SOLN
700.0000 mg | Freq: Once | INTRAVENOUS | Status: AC
  Administered 2019-11-05: 700 mg via INTRAVENOUS
  Filled 2019-11-05: qty 70

## 2019-11-05 MED ORDER — DEXAMETHASONE SODIUM PHOSPHATE 10 MG/ML IJ SOLN
10.0000 mg | Freq: Once | INTRAMUSCULAR | Status: AC
  Administered 2019-11-05: 10 mg via INTRAVENOUS

## 2019-11-05 MED ORDER — PALONOSETRON HCL INJECTION 0.25 MG/5ML
0.2500 mg | Freq: Once | INTRAVENOUS | Status: AC
  Administered 2019-11-05: 0.25 mg via INTRAVENOUS

## 2019-11-05 MED ORDER — SODIUM CHLORIDE 0.9 % IV SOLN
100.0000 mg/m2 | Freq: Once | INTRAVENOUS | Status: AC
  Administered 2019-11-05: 190 mg via INTRAVENOUS
  Filled 2019-11-05: qty 9.5

## 2019-11-05 MED ORDER — SODIUM CHLORIDE 0.9 % IV SOLN
150.0000 mg | Freq: Once | INTRAVENOUS | Status: AC
  Administered 2019-11-05: 150 mg via INTRAVENOUS
  Filled 2019-11-05: qty 150

## 2019-11-05 MED ORDER — DEXAMETHASONE SODIUM PHOSPHATE 10 MG/ML IJ SOLN
INTRAMUSCULAR | Status: AC
  Filled 2019-11-05: qty 1

## 2019-11-05 MED ORDER — SODIUM CHLORIDE 0.9 % IV SOLN: 10.0000 mg | Freq: Once | INTRAVENOUS | Status: DC

## 2019-11-05 NOTE — Progress Notes (Signed)
Oncology Nurse Navigator Documentation  Oncology Nurse Navigator Flowsheets 11/05/2019  Navigator Location [facility]  Navigator Encounter Type Clinic/MDC  Telephone -  Treatment Initiated Date -  Patient Visit Type MedOnc  Treatment Phase Treatment  Barriers/Navigation Needs Coordination of Care;Education  Education Other/I was able to speak to Mr. Solanki care giver today.  She wanted information on chemo and what side effects to look for.  I explained. She gave me disability papers and I gave them to [facility] to complete.   Interventions Coordination of Care;Education  Acuity Level 2-Minimal Needs (1-2 Barriers Identified)  Coordination of Care Other  Education Method Verbal  Time Spent with Patient 30

## 2019-11-05 NOTE — Progress Notes (Signed)
Patient's durvalumab manufacturer assistance application is still pending at this time. Spoke w/ Dr. Julien Nordmann - okay to hold durvalzumab today and initiate w/ C2 of treatment.   Nicolas Welch, PharmD, [facility]cology Pharmacist Pharmacy Phone: [phone] 11/05/2019

## 2019-11-05 NOTE — Progress Notes (Signed)
[facility] Clinical Social Work  Patient significant other arrived with patient to his appointment at [facility] and asked to meet with CSW.  Patients significant other, Julie expressed concerns for housing, financial needs and disability application.  Patient is currently staying on a friends couch and is requiring more assistance with ADLs.  Patients only form of income is unemployment.  CSW and Julie discussed housing options and the very limited resources available.  Patient and Julie were agreeable to CSW completing a referral to the [employer] through [employer].  Julie stated she had initiated patients application to social security disability.  CSW and Julie reviewed forms sent to patient from social security.  CSW faxed completed forms to number provided.  CSW consulted with nurse navigator, who also met with CSW and Julie.  Nurse navigator will facilitate getting the additional social security disability medical information forms to [facility] contact to be completed.  CSW also encouraged patient to apply for the [facility] grant.  Julie has the financial advocates contact information and plans to contact today.  Julie also confirmed that she has received a call from "someone about his medicaid", and that his application was in process.  CSW provided contact information and encouraged patient/Julie to call with questions or concerns.   , MSW, LCSW, OSW-C Clinical Social Worker [facility] [phone]                

## 2019-11-05 NOTE — Patient Instructions (Signed)
[facility] Discharge Instructions for Patients Receiving Chemotherapy  Today you received the following chemotherapy agents: carboplatin and etoposide.   To help prevent nausea and vomiting after your treatment, we encourage you to take your nausea medication as prescribed by your physician.    If you develop nausea and vomiting that is not controlled by your nausea medication, call the clinic.   BELOW ARE SYMPTOMS THAT SHOULD BE REPORTED IMMEDIATELY:  *FEVER GREATER THAN 100.5 F  *CHILLS WITH OR WITHOUT FEVER  NAUSEA AND VOMITING THAT IS NOT CONTROLLED WITH YOUR NAUSEA MEDICATION  *UNUSUAL SHORTNESS OF BREATH  *UNUSUAL BRUISING OR BLEEDING  TENDERNESS IN MOUTH AND THROAT WITH OR WITHOUT PRESENCE OF ULCERS  *URINARY PROBLEMS  *BOWEL PROBLEMS  UNUSUAL RASH Items with * indicate a potential emergency and should be followed up as soon as possible.  Feel free to call the clinic should you have any questions or concerns. The clinic phone number is [phone].  Please show the [place] at check-in to the Emergency Department and triage nurse.  Carboplatin injection What is this medicine? CARBOPLATIN (KAR boe pla tin) is a chemotherapy drug. It targets fast dividing cells, like cancer cells, and causes these cells to die. This medicine is used to treat ovarian cancer and many other cancers. This medicine may be used for other purposes; ask your health care provider or pharmacist if you have questions. COMMON BRAND NAME(S): Paraplatin What should I tell my health care provider before I take this medicine? They need to know if you have any of these conditions:  blood disorders  hearing problems  kidney disease  recent or ongoing radiation therapy  an unusual or allergic reaction to carboplatin, cisplatin, other chemotherapy, other medicines, foods, dyes, or preservatives  pregnant or trying to get pregnant  breast-feeding How should I use this  medicine? This drug is usually given as an infusion into a vein. It is administered in a hospital or clinic by a specially trained health care professional. Talk to your pediatrician regarding the use of this medicine in children. Special care may be needed. Overdosage: If you think you have taken too much of this medicine contact a poison control center or emergency room at once. NOTE: This medicine is only for you. Do not share this medicine with others. What if I miss a dose? It is important not to miss a dose. Call your doctor or health care professional if you are unable to keep an appointment. What may interact with this medicine?  medicines for seizures  medicines to increase blood counts like filgrastim, pegfilgrastim, sargramostim  some antibiotics like amikacin, gentamicin, neomycin, streptomycin, tobramycin  vaccines Talk to your doctor or health care professional before taking any of these medicines:  acetaminophen  aspirin  ibuprofen  ketoprofen  naproxen This list may not describe all possible interactions. Give your health care provider a list of all the medicines, herbs, non-prescription drugs, or dietary supplements you use. Also tell them if you smoke, drink alcohol, or use illegal drugs. Some items may interact with your medicine. What should I watch for while using this medicine? Your condition will be monitored carefully while you are receiving this medicine. You will need important blood work done while you are taking this medicine. This drug may make you feel generally unwell. This is not uncommon, as chemotherapy can affect healthy cells as well as cancer cells. Report any side effects. Continue your course of treatment even though you feel ill  unless your doctor tells you to stop. In some cases, you may be given additional medicines to help with side effects. Follow all directions for their use. Call your doctor or health care professional for advice if you  get a fever, chills or sore throat, or other symptoms of a cold or flu. Do not treat yourself. This drug decreases your body's ability to fight infections. Try to avoid being around people who are sick. This medicine may increase your risk to bruise or bleed. Call your doctor or health care professional if you notice any unusual bleeding. Be careful brushing and flossing your teeth or using a toothpick because you may get an infection or bleed more easily. If you have any dental work done, tell your dentist you are receiving this medicine. Avoid taking products that contain aspirin, acetaminophen, ibuprofen, naproxen, or ketoprofen unless instructed by your doctor. These medicines may hide a fever. Do not become pregnant while taking this medicine. Women should inform their doctor if they wish to become pregnant or think they might be pregnant. There is a potential for serious side effects to an unborn child. Talk to your health care professional or pharmacist for more information. Do not breast-feed an infant while taking this medicine. What side effects may I notice from receiving this medicine? Side effects that you should report to your doctor or health care professional as soon as possible:  allergic reactions like skin rash, itching or hives, swelling of the face, lips, or tongue  signs of infection - fever or chills, cough, sore throat, pain or difficulty passing urine  signs of decreased platelets or bleeding - bruising, pinpoint red spots on the skin, black, tarry stools, nosebleeds  signs of decreased red blood cells - unusually weak or tired, fainting spells, lightheadedness  breathing problems  changes in hearing  changes in vision  chest pain  high blood pressure  low blood counts - This drug may decrease the number of white blood cells, red blood cells and platelets. You may be at increased risk for infections and bleeding.  nausea and vomiting  pain, swelling, redness or  irritation at the injection site  pain, tingling, numbness in the hands or feet  problems with balance, talking, walking  trouble passing urine or change in the amount of urine Side effects that usually do not require medical attention (report to your doctor or health care professional if they continue or are bothersome):  hair loss  loss of appetite  metallic taste in the mouth or changes in taste This list may not describe all possible side effects. Call your doctor for medical advice about side effects. You may report side effects to FDA at 1-800-FDA-1088. Where should I keep my medicine? This drug is given in a hospital or clinic and will not be stored at home. NOTE: This sheet is a summary. It may not cover all possible information. If you have questions about this medicine, talk to your doctor, pharmacist, or health care provider.  2020 Elsevier/Gold Standard (2007-11-28 14:38:05)  Etoposide, VP-16 injection What is this medicine? ETOPOSIDE, VP-16 (e toe POE side) is a chemotherapy drug. It is used to treat testicular cancer, lung cancer, and other cancers. This medicine may be used for other purposes; ask your health care provider or pharmacist if you have questions. COMMON BRAND NAME(S): Etopophos, Toposar, VePesid What should I tell my health care provider before I take this medicine? They need to know if you have any of these conditions:  infection  kidney disease  liver disease  low blood counts, like low white cell, platelet, or red cell counts  an unusual or allergic reaction to etoposide, other medicines, foods, dyes, or preservatives  pregnant or trying to get pregnant  breast-feeding How should I use this medicine? This medicine is for infusion into a vein. It is administered in a hospital or clinic by a specially trained health care professional. Talk to your pediatrician regarding the use of this medicine in children. Special care may be  needed. Overdosage: If you think you have taken too much of this medicine contact a poison control center or emergency room at once. NOTE: This medicine is only for you. Do not share this medicine with others. What if I miss a dose? It is important not to miss your dose. Call your doctor or health care professional if you are unable to keep an appointment. What may interact with this medicine? This medicine may interact with the following medications:  warfarin This list may not describe all possible interactions. Give your health care provider a list of all the medicines, herbs, non-prescription drugs, or dietary supplements you use. Also tell them if you smoke, drink alcohol, or use illegal drugs. Some items may interact with your medicine. What should I watch for while using this medicine? Visit your doctor for checks on your progress. This drug may make you feel generally unwell. This is not uncommon, as chemotherapy can affect healthy cells as well as cancer cells. Report any side effects. Continue your course of treatment even though you feel ill unless your doctor tells you to stop. In some cases, you may be given additional medicines to help with side effects. Follow all directions for their use. Call your doctor or health care professional for advice if you get a fever, chills or sore throat, or other symptoms of a cold or flu. Do not treat yourself. This drug decreases your body's ability to fight infections. Try to avoid being around people who are sick. This medicine may increase your risk to bruise or bleed. Call your doctor or health care professional if you notice any unusual bleeding. Talk to your doctor about your risk of cancer. You may be more at risk for certain types of cancers if you take this medicine. Do not become pregnant while taking this medicine or for at least 6 months after stopping it. Women should inform their doctor if they wish to become pregnant or think they might  be pregnant. Women of child-bearing potential will need to have a negative pregnancy test before starting this medicine. There is a potential for serious side effects to an unborn child. Talk to your health care professional or pharmacist for more information. Do not breast-feed an infant while taking this medicine. Men must use a latex condom during sexual contact with a woman while taking this medicine and for at least 4 months after stopping it. A latex condom is needed even if you have had a vasectomy. Contact your doctor right away if your partner becomes pregnant. Do not donate sperm while taking this medicine and for at least 4 months after you stop taking this medicine. Men should inform their doctors if they wish to father a child. This medicine may lower sperm counts. What side effects may I notice from receiving this medicine? Side effects that you should report to your doctor or health care professional as soon as possible:  allergic reactions like skin rash, itching or hives, swelling of  the face, lips, or tongue  low blood counts - this medicine may decrease the number of white blood cells, red blood cells, and platelets. You may be at increased risk for infections and bleeding  nausea, vomiting  redness, blistering, peeling or loosening of the skin, including inside the mouth  signs and symptoms of infection like fever; chills; cough; sore throat; pain or trouble passing urine  signs and symptoms of low red blood cells or anemia such as unusually weak or tired; feeling faint or lightheaded; falls; breathing problems  unusual bruising or bleeding Side effects that usually do not require medical attention (report to your doctor or health care professional if they continue or are bothersome):  changes in taste  diarrhea  hair loss  loss of appetite  mouth sores This list may not describe all possible side effects. Call your doctor for medical advice about side effects. You  may report side effects to FDA at 1-800-FDA-1088. Where should I keep my medicine? This drug is given in a hospital or clinic and will not be stored at home. NOTE: This sheet is a summary. It may not cover all possible information. If you have questions about this medicine, talk to your doctor, pharmacist, or health care provider.  2020 Elsevier/Gold Standard (2018-10-18 16:57:15)

## 2019-11-06 ENCOUNTER — Inpatient Hospital Stay: Payer: Medicaid Other

## 2019-11-06 ENCOUNTER — Other Ambulatory Visit: Payer: Self-pay

## 2019-11-06 ENCOUNTER — Ambulatory Visit
Admission: RE | Admit: 2019-11-06 | Discharge: 2019-11-06 | Disposition: A | Discharge status: Home / Self Care | Payer: Medicaid Other | Source: Ambulatory Visit | Attending: Radiation Oncology | Admitting: Radiation Oncology

## 2019-11-06 VITALS — BP 128/92 | HR 99 | Temp 97.5°F | Resp 19

## 2019-11-06 DIAGNOSIS — Z5112 Encounter for antineoplastic immunotherapy: Secondary | ICD-10-CM | POA: Diagnosis not present

## 2019-11-06 DIAGNOSIS — Z510 Encounter for antineoplastic radiation therapy: Secondary | ICD-10-CM | POA: Diagnosis not present

## 2019-11-06 DIAGNOSIS — C3491 Malignant neoplasm of unspecified part of right bronchus or lung: Secondary | ICD-10-CM

## 2019-11-06 MED ORDER — SODIUM CHLORIDE 0.9 % IV SOLN
Freq: Once | INTRAVENOUS | Status: AC
  Filled 2019-11-06: qty 250

## 2019-11-06 MED ORDER — DEXAMETHASONE SODIUM PHOSPHATE 10 MG/ML IJ SOLN
10.0000 mg | Freq: Once | INTRAMUSCULAR | Status: AC
  Administered 2019-11-06: 15:00:00 10 mg via INTRAVENOUS

## 2019-11-06 MED ORDER — SODIUM CHLORIDE 0.9 % IV SOLN
100.0000 mg/m2 | Freq: Once | INTRAVENOUS | Status: AC
  Administered 2019-11-06: 190 mg via INTRAVENOUS
  Filled 2019-11-06: qty 9.5

## 2019-11-06 MED ORDER — DEXAMETHASONE SODIUM PHOSPHATE 10 MG/ML IJ SOLN
INTRAMUSCULAR | Status: AC
  Filled 2019-11-06: qty 1

## 2019-11-06 NOTE — Patient Instructions (Signed)
COVID-19 Vaccine Information can be found at: [email or web address] For questions related to vaccine distribution or appointments, please email [email or web address] or call [phone].   [facility] Discharge Instructions for Patients Receiving Chemotherapy  Today you received the following chemotherapy agents: Etoposide (Vepesid, VP-16)  To help prevent nausea and vomiting after your treatment, we encourage you to take your nausea medication as directed by your provider.   If you develop nausea and vomiting that is not controlled by your nausea medication, call the clinic.   BELOW ARE SYMPTOMS THAT SHOULD BE REPORTED IMMEDIATELY:  *FEVER GREATER THAN 100.5 F  *CHILLS WITH OR WITHOUT FEVER  NAUSEA AND VOMITING THAT IS NOT CONTROLLED WITH YOUR NAUSEA MEDICATION  *UNUSUAL SHORTNESS OF BREATH  *UNUSUAL BRUISING OR BLEEDING  TENDERNESS IN MOUTH AND THROAT WITH OR WITHOUT PRESENCE OF ULCERS  *URINARY PROBLEMS  *BOWEL PROBLEMS  UNUSUAL RASH Items with * indicate a potential emergency and should be followed up as soon as possible.  Feel free to call the clinic should you have any questions or concerns. The clinic phone number is [phone].  Please show the [facility] at check-in to the Emergency Department and triage nurse.  Coronavirus (COVID-19) Are you at risk?  Are you at risk for the Coronavirus (COVID-19)?  To be considered HIGH RISK for Coronavirus (COVID-19), you have to meet the following criteria:  . Traveled to [place], [place], [place], [place] or [place]; or in the [place] to [place], [place], [place], or [place]; and have fever, cough, and shortness of breath within the last 2 weeks of travel OR . Been in close contact with a person diagnosed with COVID-19 within the last 2 weeks and have fever, cough, and shortness of breath . IF YOU DO NOT MEET THESE CRITERIA, YOU ARE  CONSIDERED LOW RISK FOR COVID-19.  What to do if you are HIGH RISK for COVID-19?  Marland Kitchen If you are having a medical emergency, call 911. . Seek medical care right away. Before you go to a doctor's office, urgent care or emergency department, call ahead and tell them about your recent travel, contact with someone diagnosed with COVID-19, and your symptoms. You should receive instructions from your physician's office regarding next steps of care.  . When you arrive at healthcare provider, tell the healthcare staff immediately you have returned from visiting [place], [place], [place], [place] or [place]; or traveled in the [place] to [place], [place], [place], or [place]; in the last two weeks or you have been in close contact with a person diagnosed with COVID-19 in the last 2 weeks.   . Tell the health care staff about your symptoms: fever, cough and shortness of breath. . After you have been seen by a medical provider, you will be either: o Tested for (COVID-19) and discharged home on quarantine except to seek medical care if symptoms worsen, and asked to  - Stay home and avoid contact with others until you get your results (4-5 days)  - Avoid travel on public transportation if possible (such as bus, train, or airplane) or o Sent to the Emergency Department by EMS for evaluation, COVID-19 testing, and possible admission depending on your condition and test results.  What to do if you are LOW RISK for COVID-19?  Reduce your risk of any infection by using the same precautions used for avoiding the common cold or flu:  Marland Kitchen Wash your hands often with soap and warm water for  at least 20 seconds.  If soap and water are not readily available, use an alcohol-based hand sanitizer with at least 60% alcohol.  . If coughing or sneezing, cover your mouth and nose by coughing or sneezing into the elbow areas of your shirt or coat, into a tissue or into your sleeve (not your hands). . Avoid shaking hands  with others and consider head nods or verbal greetings only. . Avoid touching your eyes, nose, or mouth with unwashed hands.  . Avoid close contact with people who are sick. . Avoid places or events with large numbers of people in one location, like concerts or sporting events. . Carefully consider travel plans you have or are making. . If you are planning any travel outside or inside the [place], visit the CDC's Travelers' Health webpage for the latest health notices. . If you have some symptoms but not all symptoms, continue to monitor at home and seek medical attention if your symptoms worsen. . If you are having a medical emergency, call 911.   [facility] / e-Visit: [email or web address]         [facility]: [facility]: [phone]                   [facility]: [phone]

## 2019-11-07 ENCOUNTER — Ambulatory Visit (INDEPENDENT_AMBULATORY_CARE_PROVIDER_SITE_OTHER): Payer: Self-pay | Admitting: Pulmonary Disease

## 2019-11-07 ENCOUNTER — Encounter: Payer: Self-pay | Admitting: General Practice

## 2019-11-07 ENCOUNTER — Other Ambulatory Visit: Payer: Self-pay | Admitting: Internal Medicine

## 2019-11-07 ENCOUNTER — Telehealth: Payer: Self-pay | Admitting: Pulmonary Disease

## 2019-11-07 ENCOUNTER — Ambulatory Visit: Payer: Medicaid Other

## 2019-11-07 ENCOUNTER — Encounter: Payer: Self-pay | Admitting: Radiation Oncology

## 2019-11-07 ENCOUNTER — Ambulatory Visit
Admission: RE | Admit: 2019-11-07 | Discharge: 2019-11-07 | Disposition: A | Discharge status: Home / Self Care | Payer: Medicaid Other | Source: Ambulatory Visit | Attending: Radiation Oncology | Admitting: Radiation Oncology

## 2019-11-07 ENCOUNTER — Encounter: Payer: Self-pay | Admitting: Pulmonary Disease

## 2019-11-07 ENCOUNTER — Other Ambulatory Visit: Payer: Self-pay

## 2019-11-07 ENCOUNTER — Inpatient Hospital Stay: Payer: Medicaid Other

## 2019-11-07 VITALS — BP 120/70 | HR 116 | Temp 97.3°F | Ht 72.0 in | Wt 161.0 lb

## 2019-11-07 VITALS — BP 131/87 | HR 118 | Temp 98.9°F | Resp 18 | Ht 72.0 in | Wt 157.0 lb

## 2019-11-07 DIAGNOSIS — C3491 Malignant neoplasm of unspecified part of right bronchus or lung: Secondary | ICD-10-CM

## 2019-11-07 DIAGNOSIS — Z510 Encounter for antineoplastic radiation therapy: Secondary | ICD-10-CM | POA: Diagnosis not present

## 2019-11-07 DIAGNOSIS — Z5112 Encounter for antineoplastic immunotherapy: Secondary | ICD-10-CM | POA: Diagnosis not present

## 2019-11-07 DIAGNOSIS — C7951 Secondary malignant neoplasm of bone: Secondary | ICD-10-CM

## 2019-11-07 DIAGNOSIS — G893 Neoplasm related pain (acute) (chronic): Secondary | ICD-10-CM

## 2019-11-07 DIAGNOSIS — R590 Localized enlarged lymph nodes: Secondary | ICD-10-CM

## 2019-11-07 MED ORDER — ALBUTEROL SULFATE (2.5 MG/3ML) 0.083% IN NEBU: 2.5000 mg | INHALATION_SOLUTION | Freq: Four times a day (QID) | RESPIRATORY_TRACT | 12 refills | Status: DC | PRN

## 2019-11-07 MED ORDER — DEXAMETHASONE SODIUM PHOSPHATE 10 MG/ML IJ SOLN
10.0000 mg | Freq: Once | INTRAMUSCULAR | Status: AC
  Administered 2019-11-07: 10 mg via INTRAVENOUS

## 2019-11-07 MED ORDER — DEXAMETHASONE SODIUM PHOSPHATE 10 MG/ML IJ SOLN
INTRAMUSCULAR | Status: AC
  Filled 2019-11-07: qty 1

## 2019-11-07 MED ORDER — TRELEGY ELLIPTA 100-62.5-25 MCG/INH IN AEPB: 1.0000 | INHALATION_SPRAY | Freq: Every day | RESPIRATORY_TRACT | 0 refills | Status: DC

## 2019-11-07 MED ORDER — SODIUM CHLORIDE 0.9 % IV SOLN
100.0000 mg/m2 | Freq: Once | INTRAVENOUS | Status: AC
  Administered 2019-11-07: 190 mg via INTRAVENOUS
  Filled 2019-11-07: qty 9.5

## 2019-11-07 MED ORDER — SODIUM CHLORIDE 0.9 % IV SOLN
Freq: Once | INTRAVENOUS | Status: AC
  Filled 2019-11-07: qty 250

## 2019-11-07 NOTE — Patient Instructions (Signed)
Thank you for visiting Dr. Valeta Harms at [facility]. Today we recommend the following:  Referral to Clydell Hakim from interventional pain  Return in about 8 weeks (around 01/02/2020).    Please do your part to reduce the spread of COVID-19.

## 2019-11-07 NOTE — Telephone Encounter (Signed)
Received a call from [facility] in regards to pt's nebulizer. A new diagnosis code needs to be given and also needs to be added to pt's chart in the OV notes.  Dr. Valeta Harms, please advise on this and also an addendum needs to be done to [facility].

## 2019-11-07 NOTE — Progress Notes (Signed)
Synopsis: Referred in February 2021 for concern for stage IV lung cancer, in need of tissue diagnosis by No ref. provider found  Subjective:   PATIENT ID: Nicolas Welch GENDER: male DOB: 1971-09-05, MRN: [id number]  Chief Complaint  Patient presents with  . Follow-up    Patient is here for follow up. Patient states that he feels better today then he has since last visit. Patient is having shortness of breath with exertion.     This is a 49 year old gentleman who was seen 10/05/2019, by Dr. Earlie Server, patient had radiographic images highly concerning for a metastatic lung cancer with multiple bony metastasis.  Patient was referred to [place] for tissue diagnosis.  He has also had consultation with radiation oncology patient already has a brain MRI and PET scan ordered for 10/15/2019, 10/16/2019 respectively.  Due to the extensive bone metastasis radiation oncology felt that he was a good candidate for palliative radiation therapy to the lumbosacral spine.  OV 10/10/2019: Patient seen today in the office for evaluation prior to bronchoscopy.  He is in quite a bit of pain.  He has ran out of his oxycodone.  Was also placed on fentanyl patches at 50 mcg an hour.  He is having difficulty managing his pain due to multiple bony metastasis.  He was seen by oncology last week.  He does admit that he was a prior opiate abuser and has a polysubstance abuse history and does have a significant tolerance for this.  He is very open about this and wants to make sure he we understand his needs at this time.  Overall he is very anxious about his recent diagnosis and very scared about what he has been told recently.  He is thankful that we are able to see him and get him scheduled for his tissue diagnosis.  He has no questions.  We are planning with bronchoscopy for this coming Friday.  OV 11/07/2019: Patient seen today in the office for follow-up.  Still experiencing cancer related pain.  It is somewhat better.  He does have  pain that runs down into his leg at times and up in his shoulders.  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  Currently his opiate pain medication regimen managed by Dr. Earlie Server.  We have also help with this at times.  Overall his pain he has better controlled than has been in the past.  He is completing radiation.  His last radiation treatment is today.  Also has completed a few cycles of his chemotherapy regimen.  He seems to be tolerating this well.     Past Medical History:  Diagnosis Date  . Anginal pain ([place])   . Bipolar disorder ([place])   . Cancer ([place])     " lung and metastatic bone cancer "  . Depression   . GERD (gastroesophageal reflux disease)   . Hepatitis    HEP C  . Lung mass      History reviewed. No pertinent family history.   Past Surgical History:  Procedure Laterality Date  . APPENDECTOMY    . BRONCHIAL BIOPSY  10/12/2019   Procedure: BRONCHIAL BIOPSIES;  Surgeon: [name], DO;  Location: [facility];  Service: Thoracic;;  . BRONCHIAL NEEDLE ASPIRATION BIOPSY  10/12/2019   Procedure: BRONCHIAL NEEDLE ASPIRATION BIOPSIES;  Surgeon: [name], DO;  Location: [facility];  Service: Thoracic;;  . BRONCHIAL WASHINGS  10/12/2019   Procedure: BRONCHIAL WASHINGS;  Surgeon: [name], DO;  Location: [facility];  Service:  Thoracic;;  . TONSILLECTOMY AND ADENOIDECTOMY    . VIDEO BRONCHOSCOPY WITH ENDOBRONCHIAL ULTRASOUND N/A 10/12/2019   Procedure: VIDEO BRONCHOSCOPY WITH ENDOBRONCHIAL ULTRASOUND;  Surgeon: [name], DO;  Location: [facility];  Service: Thoracic;  Laterality: N/A;  . WISDOM TOOTH EXTRACTION      Social History   Socioeconomic History  . Marital status: Significant Other    Spouse name: Not on file  . Number of children: Not on file  . Years of education: Not on file  . Highest education level: Not on file  Occupational History  . Not on file  Tobacco Use  . Smoking status: Current Some Day Smoker    Packs/day: 0.25     Types: Cigarettes  . Smokeless tobacco: Never Used  . Tobacco comment: 1 cigarette every 2-3 days  Substance and Sexual Activity  . Alcohol use: No  . Drug use: Yes    Types: Cocaine, IV, Marijuana, Heroin    Comment: heroin; last use cocaine 10/09/19  . Sexual activity: Not Currently  Other Topics Concern  . Not on file  Social History Narrative  . Not on file   Social Determinants of Health   Financial Resource Strain:   . Difficulty of Paying Living Expenses: Not on file  Food Insecurity:   . Worried About Charity fundraiser in the Last Year: Not on file  . Ran Out of Food in the Last Year: Not on file  Transportation Needs:   . Lack of Transportation (Medical): Not on file  . Lack of Transportation (Non-Medical): Not on file  Physical Activity:   . Days of Exercise per Week: Not on file  . Minutes of Exercise per Session: Not on file  Stress:   . Feeling of Stress : Not on file  Social Connections:   . Frequency of Communication with Friends and Family: Not on file  . Frequency of Social Gatherings with Friends and Family: Not on file  . Attends Religious Services: Not on file  . Active Member of Clubs or Organizations: Not on file  . Attends Archivist Meetings: Not on file  . Marital Status: Not on file  Intimate Partner Violence:   . Fear of Current or Ex-Partner: Not on file  . Emotionally Abused: Not on file  . Physically Abused: Not on file  . Sexually Abused: Not on file     Allergies  Allergen Reactions  . Egg [Eggs Or Egg-Derived Products] Hives, Itching and Swelling  . Milk-Related Compounds Hives, Itching and Swelling  . Wheat Bran Hives, Itching and Swelling     Outpatient Medications Prior to Visit  Medication Sig Dispense Refill  . cephALEXin (KEFLEX) 500 MG capsule Take 1 capsule (500 mg total) by mouth 4 (four) times daily. 20 capsule 0  . cyclobenzaprine (FLEXERIL) 10 MG tablet Take 1 tablet (10 mg total) by mouth 3 (three) times  daily as needed for muscle spasms. 90 tablet 0  . dexamethasone (DECADRON) 4 MG tablet Take 1 tablet (4 mg total) by mouth 3 (three) times daily. 45 tablet 0  . fentaNYL (DURAGESIC) 75 MCG/HR Place 1 patch onto the skin every 3 (three) days. 5 patch 0  . HYDROmorphone (DILAUDID) 8 MG tablet Take 1 tablet (8 mg total) by mouth every 4 (four) hours as needed for severe pain. 30 tablet 0  . ibuprofen (ADVIL) 200 MG tablet Take 800 mg by mouth every 6 (six) hours as needed for moderate pain.     Marland Kitchen  oxyCODONE-acetaminophen (PERCOCET) 10-325 MG tablet Take 1 tablet by mouth every 6 (six) hours as needed for pain. 20 tablet 0  . prochlorperazine (COMPAZINE) 10 MG tablet Take 1 tablet (10 mg total) by mouth every 6 (six) hours as needed for nausea or vomiting. 30 tablet 2  . sucralfate (CARAFATE) 1 g tablet Take 1 tablet (1 g total) by mouth 4 (four) times daily -  with meals and at bedtime. Crush & dissolve in 10 mL of warm water prior to swallowing 60 tablet 1   No facility-administered medications prior to visit.    Review of Systems  Constitutional: Negative for chills, fever, malaise/fatigue and weight loss.  HENT: Negative for hearing loss, sore throat and tinnitus.   Eyes: Negative for blurred vision and double vision.  Respiratory: Positive for shortness of breath. Negative for cough, hemoptysis, sputum production, wheezing and stridor.   Cardiovascular: Negative for chest pain, palpitations, orthopnea, leg swelling and PND.  Gastrointestinal: Negative for abdominal pain, constipation, diarrhea, heartburn, nausea and vomiting.  Genitourinary: Negative for dysuria, hematuria and urgency.  Musculoskeletal: Positive for back pain, myalgias and neck pain. Negative for joint pain.  Skin: Negative for itching and rash.  Neurological: Positive for tingling and sensory change. Negative for dizziness, weakness and headaches.  Endo/Heme/Allergies: Negative for environmental allergies. Does not  bruise/bleed easily.  Psychiatric/Behavioral: Negative for depression. The patient is not nervous/anxious and does not have insomnia.   All other systems reviewed and are negative.    Objective:  Physical Exam Vitals reviewed.  Constitutional:      General: He is not in acute distress.    Appearance: He is well-developed.  HENT:     Head: Normocephalic and atraumatic.  Eyes:     General: No scleral icterus.    Conjunctiva/sclera: Conjunctivae normal.     Pupils: Pupils are equal, round, and reactive to light.  Neck:     Vascular: No JVD.     Trachea: No tracheal deviation.  Cardiovascular:     Rate and Rhythm: Normal rate and regular rhythm.     Heart sounds: Normal heart sounds. No murmur.  Pulmonary:     Effort: Pulmonary effort is normal. No tachypnea, accessory muscle usage or respiratory distress.     Breath sounds: No stridor. Wheezing and rhonchi present. No rales.     Comments: Bilateral rhonchi and wheezing today. Abdominal:     General: Bowel sounds are normal. There is no distension.     Palpations: Abdomen is soft.     Tenderness: There is no abdominal tenderness.  Musculoskeletal:        General: No tenderness.     Cervical back: Neck supple.  Lymphadenopathy:     Cervical: No cervical adenopathy.  Skin:    General: Skin is warm and dry.     Capillary Refill: Capillary refill takes less than 2 seconds.     Findings: No rash.  Neurological:     Mental Status: He is alert and oriented to person, place, and time.  Psychiatric:        Behavior: Behavior normal.      Vitals:   11/07/19 1103  BP: 120/70  Pulse: (!) 116  Temp: (!) 97.3 F (36.3 C)  TempSrc: Temporal  SpO2: 97%  Weight: 161 lb (73 kg)  Height: 6' (1.829 m)   97% on RA BMI Readings from Last 3 Encounters:  11/07/19 21.84 kg/m  10/29/19 21.88 kg/m  10/22/19 21.70 kg/m   Wt Readings from Last  3 Encounters:  11/07/19 161 lb (73 kg)  10/29/19 161 lb 4.8 oz (73.2 kg)  10/22/19  160 lb (72.6 kg)     CBC    Component Value Date/Time   WBC 5.9 11/05/2019 1134   WBC 22.8 (H) 10/12/2019 1302   RBC 4.53 11/05/2019 1134   HGB 12.7 (L) 11/05/2019 1134   HCT 38.2 (L) 11/05/2019 1134   PLT 160 11/05/2019 1134   MCV 84.3 11/05/2019 1134   MCH 28.0 11/05/2019 1134   MCHC 33.2 11/05/2019 1134   RDW 13.5 11/05/2019 1134   LYMPHSABS 0.4 (L) 11/05/2019 1134   MONOABS 0.5 11/05/2019 1134   EOSABS 0.1 11/05/2019 1134   BASOSABS 0.1 11/05/2019 1134    Chest Imaging: 10/03/2019 CT chest: Central right lower lobe pulmonary mass measuring 3.8 cm concerning for primary lung cancer.  Significant necrotic appearing lymph nodes within the right hilum and mediastinal adenopathy.  10/15/2019 MRI brain: 2.4 cm metastasis, 2 cm axial mass in the left CP angle.  11/02/2019: Nuclear medicine pet imaging extensive metastatic disease throughout the thorax and right lower lobe primary bronchogenic carcinoma. The patient's images have been independently reviewed by me.    Pulmonary Functions Testing Results: No flowsheet data found.    Assessment & Plan:     ICD-10-CM   1. Non-small cell carcinoma of right lung, stage 4 (HCC)  C34.91   2. 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 Endobronchial cancer, right ([place])  C34.91   4. Cancer associated pain  G89.3   5. Bony metastasis ([place])  C79.51   6. Mediastinal adenopathy  R59.0   7. Hilar adenopathy  R59.0     Discussion: 49 year old gentleman recently diagnosed with non-small cell lung cancer and small cell lung cancer.  He has significant cancer related pain bony metastasis.  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  Patient has not had PFTs in the past but does have a CT imaging of centrilobular emphysema.  Plan: Referral placed to Clydell Hakim, MD to see if he would be a candidate for any interventions to help his cancer related pain related to spinal involvement. Also new inhalers today.  New prescription for Trelegy.   Patient given samples. New prescription for DME supplies to include nebulizer machine and nebulized albuterol to be used as needed for shortness of breath and wheezing.   Greater than 50% of this patient's 22-minute office visit was spent face-to-face discussing above recommendations and treatment plan.     Current Outpatient Medications:  .  cephALEXin (KEFLEX) 500 MG capsule, Take 1 capsule (500 mg total) by mouth 4 (four) times daily., Disp: 20 capsule, Rfl: 0 .  cyclobenzaprine (FLEXERIL) 10 MG tablet, Take 1 tablet (10 mg total) by mouth 3 (three) times daily as needed for muscle spasms., Disp: 90 tablet, Rfl: 0 .  dexamethasone (DECADRON) 4 MG tablet, Take 1 tablet (4 mg total) by mouth 3 (three) times daily., Disp: 45 tablet, Rfl: 0 .  fentaNYL (DURAGESIC) 75 MCG/HR, Place 1 patch onto the skin every 3 (three) days., Disp: 5 patch, Rfl: 0 .  HYDROmorphone (DILAUDID) 8 MG tablet, Take 1 tablet (8 mg total) by mouth every 4 (four) hours as needed for severe pain., Disp: 30 tablet, Rfl: 0 .  ibuprofen (ADVIL) 200 MG tablet, Take 800 mg by mouth every 6 (six) hours as needed for moderate pain. , Disp: , Rfl:  .  oxyCODONE-acetaminophen (PERCOCET) 10-325 MG tablet, Take 1 tablet by mouth every  6 (six) hours as needed for pain., Disp: 20 tablet, Rfl: 0 .  prochlorperazine (COMPAZINE) 10 MG tablet, Take 1 tablet (10 mg total) by mouth every 6 (six) hours as needed for nausea or vomiting., Disp: 30 tablet, Rfl: 2 .  sucralfate (CARAFATE) 1 g tablet, Take 1 tablet (1 g total) by mouth 4 (four) times daily -  with meals and at bedtime. Crush & dissolve in 10 mL of warm water prior to swallowing, Disp: 60 tablet, Rfl: 1   [name], DO [facility] 11/07/2019 11:19 AM

## 2019-11-07 NOTE — Telephone Encounter (Signed)
Dx centrilobular emphysema   [name], DO [facility] 11/07/2019 5:32 PM

## 2019-11-07 NOTE — Addendum Note (Signed)
Addended by: Lia Foyer R on: 11/07/2019 11:30 AM   Modules accepted: Orders

## 2019-11-07 NOTE — Progress Notes (Signed)
Ok to treat with HR today per MD

## 2019-11-07 NOTE — Progress Notes (Signed)
[facility] CSW Progress Notes  Called patient's significant other, Almyra Free, to clarify status of his Medicaid and disability applications.  Also clarified county of residence.  Per significant other, they are living temporarily w friend near [facility] but can be asked to leave "at any time, it is unpredictable."  Per email from [facility], pt has pending Medicaid app. Application date: 66/02/44. 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  Update requested from Ms Donalda Ewings.  Family's major concern is financial instability as they are both on unemployment which is set to run out soon.  This financial instability is leading to housing instability as they cannot rent housing without income.  Awaiting more information.  Edwyna Shell, LCSW Clinical Social Worker Phone:  [phone] Cell:  [phone]

## 2019-11-07 NOTE — Patient Instructions (Signed)
COVID-19 Vaccine Information can be found at: [email or web address] For questions related to vaccine distribution or appointments, please email [email or web address] or call [phone].   [facility] Discharge Instructions for Patients Receiving Chemotherapy  Today you received the following chemotherapy agents: Etoposide (Vepesid, VP-16)  To help prevent nausea and vomiting after your treatment, we encourage you to take your nausea medication as directed by your provider.   If you develop nausea and vomiting that is not controlled by your nausea medication, call the clinic.   BELOW ARE SYMPTOMS THAT SHOULD BE REPORTED IMMEDIATELY:  *FEVER GREATER THAN 100.5 F  *CHILLS WITH OR WITHOUT FEVER  NAUSEA AND VOMITING THAT IS NOT CONTROLLED WITH YOUR NAUSEA MEDICATION  *UNUSUAL SHORTNESS OF BREATH  *UNUSUAL BRUISING OR BLEEDING  TENDERNESS IN MOUTH AND THROAT WITH OR WITHOUT PRESENCE OF ULCERS  *URINARY PROBLEMS  *BOWEL PROBLEMS  UNUSUAL RASH Items with * indicate a potential emergency and should be followed up as soon as possible.  Feel free to call the clinic should you have any questions or concerns. The clinic phone number is [phone].  Please show the [facility] at check-in to the Emergency Department and triage nurse.  Coronavirus (COVID-19) Are you at risk?  Are you at risk for the Coronavirus (COVID-19)?  To be considered HIGH RISK for Coronavirus (COVID-19), you have to meet the following criteria:  . Traveled to [place], [place], [place], [place] or [place]; or in the [place] to [place], [place], [place], or [place]; and have fever, cough, and shortness of breath within the last 2 weeks of travel OR . Been in close contact with a person diagnosed with COVID-19 within the last 2 weeks and have fever, cough, and shortness of breath . IF YOU DO NOT MEET THESE CRITERIA, YOU ARE  CONSIDERED LOW RISK FOR COVID-19.  What to do if you are HIGH RISK for COVID-19?  Marland Kitchen If you are having a medical emergency, call 911. . Seek medical care right away. Before you go to a doctor's office, urgent care or emergency department, call ahead and tell them about your recent travel, contact with someone diagnosed with COVID-19, and your symptoms. You should receive instructions from your physician's office regarding next steps of care.  . When you arrive at healthcare provider, tell the healthcare staff immediately you have returned from visiting [place], [place], [place], [place] or [place]; or traveled in the [place] to [place], [place], [place], or [place]; in the last two weeks or you have been in close contact with a person diagnosed with COVID-19 in the last 2 weeks.   . Tell the health care staff about your symptoms: fever, cough and shortness of breath. . After you have been seen by a medical provider, you will be either: o Tested for (COVID-19) and discharged home on quarantine except to seek medical care if symptoms worsen, and asked to  - Stay home and avoid contact with others until you get your results (4-5 days)  - Avoid travel on public transportation if possible (such as bus, train, or airplane) or o Sent to the Emergency Department by EMS for evaluation, COVID-19 testing, and possible admission depending on your condition and test results.  What to do if you are LOW RISK for COVID-19?  Reduce your risk of any infection by using the same precautions used for avoiding the common cold or flu:  Marland Kitchen Wash your hands often with soap and warm water for  at least 20 seconds.  If soap and water are not readily available, use an alcohol-based hand sanitizer with at least 60% alcohol.  . If coughing or sneezing, cover your mouth and nose by coughing or sneezing into the elbow areas of your shirt or coat, into a tissue or into your sleeve (not your hands). . Avoid shaking hands  with others and consider head nods or verbal greetings only. . Avoid touching your eyes, nose, or mouth with unwashed hands.  . Avoid close contact with people who are sick. . Avoid places or events with large numbers of people in one location, like concerts or sporting events. . Carefully consider travel plans you have or are making. . If you are planning any travel outside or inside the [place], visit the CDC's Travelers' Health webpage for the latest health notices. . If you have some symptoms but not all symptoms, continue to monitor at home and seek medical attention if your symptoms worsen. . If you are having a medical emergency, call 911.   [place] / e-Visit: [email or web address]         [facility]: [facility]: [phone]                   [facility]: [phone]

## 2019-11-08 NOTE — Telephone Encounter (Signed)
Dr. Valeta Harms, I called Nicolas Welch and was told that the patient does not have insurance. They reached out to the patient to offer the nebulizer at both $90 and $45 and he stated he could not afford either cost for machine or medication.  Based on the patient response, lack of insurance and the diagnosis not reflected in the office note, the order submitted was cancelled.  They will need the office note amended in order to reflect the new diagnosis and a new order entered as well. However, considering the patient cannot afford the equipment and medication (pending insurance approval) was this something to talk to the patient about in more detail when he comes back to see you?

## 2019-11-08 NOTE — Telephone Encounter (Signed)
PCCM: The office note does state centrilobular emphysema.  Albuterol solution can come from pharmacy and usually is not expensive He can purchase a nebulizer and tubing from [facility] or from a medical supply store for around 20$ [name], [facility] 11/08/2019 12:26 PM

## 2019-11-09 ENCOUNTER — Other Ambulatory Visit: Payer: Self-pay

## 2019-11-09 ENCOUNTER — Inpatient Hospital Stay: Payer: Medicaid Other

## 2019-11-09 VITALS — BP 109/73 | HR 111 | Temp 99.1°F | Resp 18

## 2019-11-09 DIAGNOSIS — C3491 Malignant neoplasm of unspecified part of right bronchus or lung: Secondary | ICD-10-CM

## 2019-11-09 DIAGNOSIS — Z5112 Encounter for antineoplastic immunotherapy: Secondary | ICD-10-CM | POA: Diagnosis not present

## 2019-11-09 MED ORDER — PEGFILGRASTIM-CBQV 6 MG/0.6ML ~~LOC~~ SOSY
PREFILLED_SYRINGE | SUBCUTANEOUS | Status: AC
  Filled 2019-11-09: qty 0.6

## 2019-11-09 MED ORDER — PEGFILGRASTIM-CBQV 6 MG/0.6ML ~~LOC~~ SOSY
6.0000 mg | PREFILLED_SYRINGE | Freq: Once | SUBCUTANEOUS | Status: AC
  Administered 2019-11-09: 6 mg via SUBCUTANEOUS

## 2019-11-09 NOTE — Telephone Encounter (Signed)
Called and spoke with patient regarding Nebulizer machine and medication for it. Patient expressed understanding. Nothing further needed at this time

## 2019-11-09 NOTE — Patient Instructions (Signed)

## 2019-11-11 NOTE — Progress Notes (Signed)
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  The patient presented with a right lower lobe lung mass in addition to right hilar and mediastinal lymphadenopathy as well as extensive metastatic bone disease, metastatic disease to the left jugular tubercle and jugular foramen, and lymphadenopathy in the abdomen.  He was diagnosed in January 2021.   PRIOR THERAPY: Palliative radiotherapy to the painful bone metastatic lesions under the care of Dr. Sondra Come. Last treatment on 11/07/19  CURRENT THERAPY: palliative systemic chemotherapy with carboplatin for an AUC of 5 on day 1, etoposide 100 mg/m on days 1, 2, and 3, and Imfinzi 1500 mg on day 1 IV every 3 weeks with Neulasta support.  First dose on 11/05/2019. Status post 1 cycle.   INTERVAL HISTORY: Nicolas Welch 49 y.o. male returns to clinic today for follow-up visit accompanied by his wife.  Today, the patient is feeling fair without any concerning complaints except for decreased appetite and weight loss.  The patient's wife states that she cooks for him and tries to encourage him to drink ensures.  He lost approximately 6 pounds since his last appointment.  To help with his appetite, the patient smokes marijuana. The patient has extensive metastatic disease to the bones.  The patient underwent palliative radiotherapy to the bone metastases under the care of Dr. Sondra Come.  He is currently prescribed 75 mcg of fentanyl patches as well as Percocet for pain.  The patient was recently referred to Dr. Clydell Hakim by Dr. Valeta Harms for recommendations for persistent pain and spinal involvement of his metastases. The patient is endorsing continued pain that radiates bilaterally down his leg. He denies any stool/urine incontinence, saddle anesthesia, or extremity  weakness but he does endorse some numbness to his right knee.  Pulmonology also recently prescribed inhalers for the patient's COPD and placed orders for the patient to have a nebulizer.  The patient has not picked up his nebulizer yet due to the expense. The patient is followed by social work for his financial concerns.  The patient completed his first cycle of chemotherapy last week without any adverse side effects except for one episode of nonbloody diarrhea and mild nausea without vomiting. He denies any recent fever, chills, or night sweats. He reports his baseline shortness of breath with exertion and bilateral rib pain secondary to his bone metastases.  He denies any hemoptysis.   He denies any diarrhea or constipation at this time. He is scheduled to have a repeat brain MRI later this month and was referred to audiology for evaluation of some degree of hearing loss. He denies any rashes or skin changes.  He is here today for a 1 week follow-up visit after completing his first cycle of chemotherapy.  MEDICAL HISTORY: Past Medical History:  Diagnosis Date  . Anginal pain ([place])   . Bipolar disorder ([place])   . Cancer ([place])     " lung and metastatic bone cancer "  . Depression   . GERD (gastroesophageal reflux disease)   . Hepatitis    HEP C  . Lung mass     ALLERGIES:  is allergic to egg [eggs or egg-derived products]; milk-related compounds; and wheat bran.  MEDICATIONS:  Current Outpatient Medications  Medication Sig Dispense Refill  . albuterol (PROVENTIL) (2.5 MG/3ML) 0.083% nebulizer solution Take 3 mLs (2.5 mg total)  by nebulization every 6 (six) hours as needed for wheezing or shortness of breath. 75 mL 12  . cephALEXin (KEFLEX) 500 MG capsule Take 1 capsule (500 mg total) by mouth 4 (four) times daily. 20 capsule 0  . cyclobenzaprine (FLEXERIL) 10 MG tablet Take 1 tablet (10 mg total) by mouth 3 (three) times daily as needed for muscle spasms. 90 tablet 0  . dexamethasone  (DECADRON) 4 MG tablet Take 1 tablet (4 mg total) by mouth 3 (three) times daily. 45 tablet 0  . fentaNYL (DURAGESIC) 75 MCG/HR Place 1 patch onto the skin every 3 (three) days. 5 patch 0  . Fluticasone-Umeclidin-Vilant (TRELEGY ELLIPTA) 100-62.5-25 MCG/INH AEPB Inhale 1 puff into the lungs daily. 28 each 0  . HYDROmorphone (DILAUDID) 8 MG tablet Take 1 tablet (8 mg total) by mouth every 4 (four) hours as needed for severe pain. 30 tablet 0  . ibuprofen (ADVIL) 200 MG tablet Take 800 mg by mouth every 6 (six) hours as needed for moderate pain.     Marland Kitchen oxyCODONE-acetaminophen (PERCOCET) 10-325 MG tablet Take 1 tablet by mouth every 6 (six) hours as needed for pain. 20 tablet 0  . prochlorperazine (COMPAZINE) 10 MG tablet Take 1 tablet (10 mg total) by mouth every 6 (six) hours as needed for nausea or vomiting. 30 tablet 2  . sucralfate (CARAFATE) 1 g tablet Take 1 tablet (1 g total) by mouth 4 (four) times daily -  with meals and at bedtime. Crush & dissolve in 10 mL of warm water prior to swallowing 60 tablet 1   No current facility-administered medications for this visit.    SURGICAL HISTORY:  Past Surgical History:  Procedure Laterality Date  . APPENDECTOMY    . BRONCHIAL BIOPSY  10/12/2019   Procedure: BRONCHIAL BIOPSIES;  Surgeon: [name], DO;  Location: [facility];  Service: Thoracic;;  . BRONCHIAL NEEDLE ASPIRATION BIOPSY  10/12/2019   Procedure: BRONCHIAL NEEDLE ASPIRATION BIOPSIES;  Surgeon: [name], DO;  Location: [facility];  Service: Thoracic;;  . BRONCHIAL WASHINGS  10/12/2019   Procedure: BRONCHIAL WASHINGS;  Surgeon: [name], DO;  Location: [facility];  Service: Thoracic;;  . TONSILLECTOMY AND ADENOIDECTOMY    . VIDEO BRONCHOSCOPY WITH ENDOBRONCHIAL ULTRASOUND N/A 10/12/2019   Procedure: VIDEO BRONCHOSCOPY WITH ENDOBRONCHIAL ULTRASOUND;  Surgeon: [name], DO;  Location: [facility];  Service: Thoracic;  Laterality: N/A;  . WISDOM TOOTH  EXTRACTION      REVIEW OF SYSTEMS:   Review of Systems  Constitutional: Positive for appetite change, weight loss, and fatigue.  Negative for chills and fever. HENT: Negative for mouth sores, nosebleeds, sore throat and trouble swallowing.   Eyes: Negative for eye problems and icterus.  Respiratory: Positive for wheezing, baseline shortness of breath with exertion, and and cough.  Negative for hemoptysis. Cardiovascular: Negative for chest pain and leg swelling.  Gastrointestinal: Positive for mild nausea and one episode of diarrhea (resolved). Negative for abdominal pain, constipation,  and vomiting.  Genitourinary: Negative for bladder incontinence, difficulty urinating, dysuria, frequency and hematuria.   Musculoskeletal: Positive for back pain.Negative for gait problem, neck pain and neck stiffness.  Skin: Negative for itching and rash.  Neurological: Negative for dizziness, extremity weakness, gait problem, headaches, light-headedness and seizures.  Hematological: Negative for adenopathy. Does not bruise/bleed easily.  Psychiatric/Behavioral: Negative for confusion, depression and sleep disturbance. The patient is not nervous/anxious.     PHYSICAL EXAMINATION:  There were no vitals taken for this visit.  ECOG  PERFORMANCE STATUS: 1 - Symptomatic but completely ambulatory  Physical Exam  Constitutional: Oriented to person, place, and time and chronically ill-appearing male and in no distress.  HENT:  Head: Normocephalic and atraumatic.  Mouth/Throat: Oropharynx is clear and moist. No oropharyngeal exudate.  Eyes: Conjunctivae are normal. Right eye exhibits no discharge. Left eye exhibits no discharge. No scleral icterus.  Neck: Normal range of motion. Neck supple.  Cardiovascular: Normal rate, regular rhythm, normal heart sounds and intact distal pulses.   Pulmonary/Chest: Effort normal.  Positive for mild wheezing.  No respiratory distress.  No rales.  Abdominal: Soft. Bowel  sounds are normal. Exhibits no distension and no mass. There is no tenderness.  Musculoskeletal: Normal range of motion. Exhibits no edema.  Lymphadenopathy:    No cervical adenopathy.  Neurological: Alert and oriented to person, place, and time. Exhibits normal muscle tone. Gait normal. Coordination normal.  Skin: Skin is warm and dry. No rash noted. Not diaphoretic. No erythema. No pallor.  Psychiatric: Mood, memory and judgment normal.  Vitals reviewed.  LABORATORY DATA: Lab Results  Component Value Date   WBC 5.9 11/05/2019   HGB 12.7 (L) 11/05/2019   HCT 38.2 (L) 11/05/2019   MCV 84.3 11/05/2019   PLT 160 11/05/2019      Chemistry      Component Value Date/Time   NA 136 11/05/2019 1134   K 3.8 11/05/2019 1134   CL 101 11/05/2019 1134   CO2 27 11/05/2019 1134   BUN 7 11/05/2019 1134   CREATININE 0.63 11/05/2019 1134      Component Value Date/Time   CALCIUM 9.2 11/05/2019 1134   ALKPHOS 132 (H) 11/05/2019 1134   AST 25 11/05/2019 1134   ALT 21 11/05/2019 1134   BILITOT 0.4 11/05/2019 1134       RADIOGRAPHIC STUDIES:  MR Brain W Wo Contrast  Result Date: 10/16/2019 CLINICAL DATA:  Lung cancer staging EXAM: MRI HEAD WITHOUT AND WITH CONTRAST TECHNIQUE: Multiplanar, multiecho pulse sequences of the brain and surrounding structures were obtained without and with intravenous contrast. CONTRAST:  56mL GADAVIST GADOBUTROL 1 MMOL/ML IV SOLN COMPARISON:  None. FINDINGS: Brain: There is an area of nearly isointense masslike swelling at the left CP angle cistern measuring up to 2 cm, intra-axial. There is no detectable superimposed enhancement. No enhancing lesions seen throughout the brain. Scattered FLAIR hyperintensities in the cerebral white matter, usually chronic small vessel ischemia. Brain volume is normal. No infarct, hydrocephalus, or extra-axial collection. Vascular: Normal intracranial flow voids. Diminished flow in the upper left internal jugular vein attributed to  metastatic disease Skull and upper cervical spine: Avidly enhancing mass centered at the left jugular tubercle, measuring 24 mm. The mass encroaches on the jugular foramen. The left hypoglossal canal appears unaffected. Sinuses/Orbits: No evidence of metastatic disease. IMPRESSION: 1. 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 2. 2 cm intra-axial mass at the left CP angle, nearly isointense to brain and notably nonenhancing, the latter unexpected for size and tumor type. Incidental infiltrating glioma is a possibility. Electronically Signed   By: Monte Fantasia M.D.   On: 10/16/2019 06:06   NM PET Image Initial (PI) Skull Base To Thigh  Result Date: 11/02/2019 CLINICAL DATA:  Initial treatment strategy for non-small-cell lung cancer. Staging. EXAM: NUCLEAR MEDICINE PET SKULL BASE TO THIGH TECHNIQUE: 8.2 mCi F-18 FDG was injected intravenously. Full-ring PET imaging was performed from the skull base to thigh after the radiotracer. CT data was obtained and used  for attenuation correction and anatomic localization. Fasting blood glucose: 97 mg/dl COMPARISON:  Chest abdomen and pelvic CTs 10/03/2019 FINDINGS: Mediastinal blood pool activity: SUV max 2.4 Liver activity: SUV max NA NECK: Hypermetabolism corresponding to the previously described soft tissue mass at the left jugular tubercle. Example 2.5 cm and a S.U.V. max of 10.2 on 05/04. No cervical nodal hypermetabolism. Incidental CT findings: Right carotid atherosclerosis which is significantly age advanced. No cervical adenopathy. CHEST: Extensive thoracic hypermetabolic adenopathy. Example subcarinal nodal mass at 2.6 cm and a S.U.V. max of 6.0 on 67/4. This is enlarged from 2.4 cm on 10/03/2019. Progression of presumed central right lower lobe lung mass and surrounding collapse/consolidation. The mass is difficult to differentiate from the surrounding postobstructive consolidation. Measures on the order of a S.U.V. max of  7.4, including on 45/8. Incidental CT findings: Posterior right upper lobe 5 mm pulmonary nodule on 27/8, similar. Superior segment right lower lobe pulmonary nodule on 27/8, 4 mm and similar. Aortic and coronary artery atherosclerosis. ABDOMEN/PELVIS: Right hepatic lobe lesion is relatively CT occult, corresponds to a too small to characterize lesion on the prior contrast enhanced CT. Measures a S.U.V. max of 6.3, including on 111/4. A focus of hypermetabolism about the terminal ileum is without CT correlate, favored to be physiologic at a S.U.V. max of 10.1. Incidental CT findings: Abdominal aortic atherosclerosis. Large colonic stool burden. Normal adrenal glands. Suspect a higher subcentimeter low-density right hepatic lobe lesion on 93/4, nonspecific. SKELETON: Widespread osseous metastasis. Index right ischial lytic lesion measures 3.1 cm and a S.U.V. max of 8.2 on 185/4. An eighth rib osseous and soft tissue mass measures 4.3 x 3.0 cm and a S.U.V. max of 8.5 on 71/4. Incidental CT findings: none IMPRESSION: 1. Right lower lobe primary bronchogenic carcinoma with metastatic disease to thoracic nodes, bones, and liver as detailed above. Disease progression since 10/03/2019 as detailed above. 2. Coronary artery atherosclerosis. Aortic Atherosclerosis (ICD10-I70.0). 3. Small pulmonary nodules, suspicious.  Below PET resolution. Electronically Signed   By: Abigail Miyamoto M.D.   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  The patient presented with a right lower lobe lung mass in addition to right hilar and mediastinal lymphadenopathy as well as extensive metastatic bone disease, metastatic disease to the left jugular tubercle and jugular foramen, and lymphadenopathy in the abdomen.  He was diagnosed in January 2021.   The patient completed  palliative radiotherapy to the painful bone metastatic lesions under the care of Dr. Sondra Come. Last treatment on 11/07/19.   The patient is currently undergoing palliative systemic chemotherapy with carboplatin for an AUC of 5 on days 1, etoposide 100 mg/m on days 1, 2, and 3 and Imfinzi 1500 mg IV every 3 weeks with Neulasta support.  The patient is status post 1 cycle and tolerated it well without any concerning complaints except for mild nausea and one episode of diarrhea.    Labs were reviewed.  We recommend that the patient continue on the same treatment at the same dose.  We will see patient back for follow-up visit in 2 weeks for evaluation before starting cycle #2.  He will continue to use Percocet and his fentanyl patches for pain.  I sent a refill of fentanyl and Percocet to his pharmacy.  I encouraged the patient to follow-up with Dr. Maryjean Ka for recommendations for his  spine involvement/pain.  For his appetite, I continue to encourage the patient to increase his intake of calorie rich foods. The patient is not a good candidate for steroids due to his treatment with immunotherapy.  He is not a good candidate for Remeron due to drug to drug interactions with his pain medications.  I have placed a referral to the nutritionist team here at the cancer center for formal evaluation and recommendations regarding his decreased appetite and weight loss.  For his breathing, I strongly encouraged the patient to pick up his inhalers as prescribed by pulmonology.   The patient was advised to call immediately if he has any concerning symptoms in the interval. The patient voices understanding of current disease status and treatment options and is in agreement with the current care plan. All questions were answered. The patient knows to call the clinic with any problems, questions or concerns. We can certainly see the patient much sooner if necessary.       No orders of the defined types were placed  in this encounter.    Cassandra L Heilingoetter, PA-C 11/11/19

## 2019-11-12 ENCOUNTER — Inpatient Hospital Stay (HOSPITAL_BASED_OUTPATIENT_CLINIC_OR_DEPARTMENT_OTHER): Payer: Medicaid Other | Admitting: Physician Assistant

## 2019-11-12 ENCOUNTER — Telehealth: Payer: Self-pay | Admitting: *Deleted

## 2019-11-12 ENCOUNTER — Encounter: Payer: Self-pay | Admitting: Physician Assistant

## 2019-11-12 ENCOUNTER — Other Ambulatory Visit: Payer: Self-pay

## 2019-11-12 ENCOUNTER — Inpatient Hospital Stay: Payer: Medicaid Other

## 2019-11-12 VITALS — BP 109/75 | HR 116 | Temp 99.1°F | Resp 17 | Ht 72.0 in | Wt 155.6 lb

## 2019-11-12 DIAGNOSIS — C3491 Malignant neoplasm of unspecified part of right bronchus or lung: Secondary | ICD-10-CM

## 2019-11-12 DIAGNOSIS — R634 Abnormal weight loss: Secondary | ICD-10-CM | POA: Insufficient documentation

## 2019-11-12 DIAGNOSIS — R630 Anorexia: Secondary | ICD-10-CM

## 2019-11-12 DIAGNOSIS — Z5112 Encounter for antineoplastic immunotherapy: Secondary | ICD-10-CM | POA: Diagnosis not present

## 2019-11-12 DIAGNOSIS — G893 Neoplasm related pain (acute) (chronic): Secondary | ICD-10-CM

## 2019-11-12 DIAGNOSIS — C7951 Secondary malignant neoplasm of bone: Secondary | ICD-10-CM

## 2019-11-12 LAB — CBC WITH DIFFERENTIAL (CANCER CENTER ONLY)
Abs Immature Granulocytes: 0.04 K/uL (ref 0.00–0.07)
Basophils Absolute: 0.1 K/uL (ref 0.0–0.1)
Basophils Relative: 1 %
Eosinophils Absolute: 0 K/uL (ref 0.0–0.5)
Eosinophils Relative: 0 %
HCT: 37.1 % — ABNORMAL LOW (ref 39.0–52.0)
Hemoglobin: 12.4 g/dL — ABNORMAL LOW (ref 13.0–17.0)
Immature Granulocytes: 1 %
Lymphocytes Relative: 3 %
Lymphs Abs: 0.2 K/uL — ABNORMAL LOW (ref 0.7–4.0)
MCH: 28.2 pg (ref 26.0–34.0)
MCHC: 33.4 g/dL (ref 30.0–36.0)
MCV: 84.3 fL (ref 80.0–100.0)
Monocytes Absolute: 0.3 K/uL (ref 0.1–1.0)
Monocytes Relative: 4 %
Neutro Abs: 5.4 K/uL (ref 1.7–7.7)
Neutrophils Relative %: 91 %
Platelet Count: 98 K/uL — ABNORMAL LOW (ref 150–400)
RBC: 4.4 MIL/uL (ref 4.22–5.81)
RDW: 13.2 % (ref 11.5–15.5)
WBC Count: 5.9 K/uL (ref 4.0–10.5)
nRBC: 0 % (ref 0.0–0.2)

## 2019-11-12 LAB — CMP (CANCER CENTER ONLY)
ALT: 39 U/L (ref 0–44)
AST: 31 U/L (ref 15–41)
Albumin: 2.8 g/dL — ABNORMAL LOW (ref 3.5–5.0)
Alkaline Phosphatase: 217 U/L — ABNORMAL HIGH (ref 38–126)
Anion gap: 8 (ref 5–15)
BUN: 14 mg/dL (ref 6–20)
CO2: 25 mmol/L (ref 22–32)
Calcium: 8.9 mg/dL (ref 8.9–10.3)
Chloride: 101 mmol/L (ref 98–111)
Creatinine: 0.65 mg/dL (ref 0.61–1.24)
GFR, Est AFR Am: >60 mL/min (ref >60)
GFR, Estimated: >60 mL/min (ref >60)
Glucose, Bld: 128 mg/dL — ABNORMAL HIGH (ref 70–99)
Potassium: 4.1 mmol/L (ref 3.5–5.1)
Sodium: 134 mmol/L — ABNORMAL LOW (ref 135–145)
Total Bilirubin: 0.8 mg/dL (ref 0.3–1.2)
Total Protein: 7.3 g/dL (ref 6.5–8.1)

## 2019-11-12 MED ORDER — FENTANYL 75 MCG/HR TD PT72: 1.0000 | MEDICATED_PATCH | TRANSDERMAL | 0 refills | Status: DC

## 2019-11-12 MED ORDER — OXYCODONE-ACETAMINOPHEN 10-325 MG PO TABS: 1.0000 | ORAL_TABLET | Freq: Four times a day (QID) | ORAL | 0 refills | Status: DC | PRN

## 2019-11-12 NOTE — Telephone Encounter (Signed)
Patient called about issue with [facility] filling Fentanyl Rx.  Upon review determined the following from chart review and [facility] location [place] pharmacist Dan:  50 mcg Dr. Julien Nordmann 10/05/2019 5 patches  Dose increased due to unmanaged pain  75 mcg. Dr. Valeta Harms 10/10/2019 10 patches 75 mcg Dr. Tamala Julian 10/22/2019 5 patches DISREGARD 75 mcg 10/29/2019 to release on 11/05/2019 by Cassandra Heilingeotter, PA, confirmed Rx was printed and never given to patient and never received by [facility]  Up to this point patient had 45 days worth of patches that he has used 32 days worth.  Too soon to fill today's Rx Fentanyl 75 mcg by [employer], PA   Reviewed with patient and significant other.  They stated that they are paying out of pocket and using [facility] and one of these times they didn't receive all of their patches.  Advised that they would need to resolve that with pharmacy.  Stated understanding

## 2019-11-13 ENCOUNTER — Encounter: Payer: Self-pay | Admitting: *Deleted

## 2019-11-15 ENCOUNTER — Telehealth: Payer: Self-pay | Admitting: Pulmonary Disease

## 2019-11-15 ENCOUNTER — Other Ambulatory Visit: Payer: Self-pay | Admitting: Radiation Therapy

## 2019-11-15 ENCOUNTER — Telehealth: Payer: Self-pay | Admitting: General Practice

## 2019-11-15 NOTE — Telephone Encounter (Signed)
[facility] CSW Progress Notes  Call from Rosanne Sack - has questions regarding access to records for disability application.  Has worked through [employer] to request records - wants to work Product/process development scientist to apply for disability.  Lawyer is requesting paper copies of records.

## 2019-11-15 NOTE — Telephone Encounter (Signed)
[facility] CSW Progress Notes  Call from Rosanne Sack, patient's partner.  She has received paperwork from [place].

## 2019-11-15 NOTE — Telephone Encounter (Signed)
Per Rosanne Sack, she has applied for disability on patient's behalf.  PAperwork has been received from [employer], he is signing and returning it.  They have spoken w [facility] but are not currently working w them.  Per [facility], the patient does have an active disability application.  Partner states that their housing is unstable, patient is sleeping on couch as he cannot climb stairs.  Is quite fatigued from chemotherapy.  They have been referred to a spinal specialist due to pain - referral done by Dr Valeta Harms.  Patient cannot afford the $250 deposit required to schedule initial appointment w specialist.  His Medicaid application is underway w [place], paperwork is not complete as patient has papers at home to sign/return.  [facility] CSW is unaware of any resources to assist w required deposit for spine specialist.  Reminded of [place] - patient has met w [employer] but has not completed application.  These funds can be used for living expenses and certain medication needs.  Edwyna Shell, LCSW Clinical Social Worker Phone:  [phone]

## 2019-11-15 NOTE — Telephone Encounter (Signed)
Spoke with Nicolas Welch, she was unable to talk now because she was in the store and she lost her keys. She will call us back when she is available. Will await return call.

## 2019-11-16 ENCOUNTER — Encounter: Payer: Self-pay | Admitting: General Practice

## 2019-11-16 NOTE — Telephone Encounter (Signed)
Spoke with pt, she states the doctor for pain management is requiring a 250.00 deposit before he can be seen. Almyra Free would like to know if Dr. Valeta Harms can talk to Dr. Maryjean Ka to see if he could possibly waive the fee. He is in a lot of pain and would like to see what Dr. Maryjean Ka has to offer. Dr, Valeta Harms please advise if you have any other suggestions.

## 2019-11-16 NOTE — Progress Notes (Signed)
[facility] CSW Progress Notes  Call from [employer], significant other.  States that "landlord is acting funny, he is changing the locks on [place], we will have no place to stay."  They do not anticipate any income until Monday at the earliest.  Concerned that they will be unsheltered soon.  Provided number for [facility] [phone]).  They will need to contact this entity to see whether there is a bed available.  Significant other also states that she is checking into options in [employer].  Patient and significant other have unemployment income to work with, will continue through September.  They have been referred to [employer] by [employer] for help w locating housing in their price range.    Edwyna Shell, LCSW Clinical Social Worker Phone:  [phone] Cell:  [phone]

## 2019-11-16 NOTE — Telephone Encounter (Signed)
Nicolas Welch, see message below. Any options on cost? I this gentleman lives with a friend. Effectively homeless. I appreciate the assistance. Thanks Lance Muss, DO [facility] 11/16/2019 11:44 AM

## 2019-11-19 ENCOUNTER — Inpatient Hospital Stay: Payer: Medicaid Other

## 2019-11-19 ENCOUNTER — Telehealth: Payer: Self-pay | Admitting: Internal Medicine

## 2019-11-19 NOTE — Telephone Encounter (Signed)
Rescheduled 3/15 lab to 3/16 per patient request to reschedule. Called and left msg.

## 2019-11-20 ENCOUNTER — Inpatient Hospital Stay: Payer: Medicaid Other

## 2019-11-20 ENCOUNTER — Telehealth: Payer: Self-pay | Admitting: Internal Medicine

## 2019-11-20 ENCOUNTER — Ambulatory Visit (HOSPITAL_COMMUNITY): Admission: RE | Admit: 2019-11-20 | Payer: Self-pay | Source: Ambulatory Visit

## 2019-11-20 NOTE — Telephone Encounter (Signed)
I talked to the front office people and got them to waive the fee. He's got an appointment for next week.

## 2019-11-20 NOTE — Telephone Encounter (Signed)
Pt called to reschedule 3/16 appt - message sent to central radiology

## 2019-11-20 NOTE — Telephone Encounter (Signed)
Thank you. I appreciate it.  [facility] 11/20/2019 12:17 PM

## 2019-11-21 NOTE — Progress Notes (Incomplete)
  Patient Name: Nicolas Welch MRN: [id number] DOB: 09-May-1971 Referring Physician: Curt Bears (Profile Not Attached) Date of Service: 11/07/2019 [facility], [place]                                                        End Of Treatment Note  Diagnoses: C79.51-Secondary malignant neoplasm of bone  Cancer Staging: Presumed non-small cell carcinoma of right lung with bony metastases  Intent: Palliative  Radiation Treatment Dates: 10/15/2019 through 11/07/2019 Site Technique Total Dose (Gy) Dose per Fx (Gy) Completed Fx Beam Energies  Thoracic Spine: Spine 3D 35/35 2.5 14/14 10X, 15X  Lumbar Spine: Spine 3D 35/35 2.5 14/14 15X   Narrative: The patient tolerated radiation therapy relatively well. He reported baseline fatigue. Although he was having to ambulate with a cane, he did report some slight improvement to back pain since starting radiation therapy. On 10/30/2019, the patient began to report some esophageal symptoms for which he was prescribed Carafate.  Of note, the patient had a brain MRI on 10/15/2019. Results showed 2.4 cm metastasis centered at the left jugular tubercle and jugular foramen. There was also a 2 cm intra-axial mass at the left CP angle, nearly isointense to brain and notably non-enhancing, the latter unexpected for size and tumor type. Incidental infiltrating glioma is a possibility.  He also had a PET scan on 11/02/2019 which showed a right lower lobe primary bronchogenic carcinoma with metastatic disease to thoracic nodes, bones, and liver. Disease progression since 10/03/2019. There were also some small, suspicious pulmonary nodules.  On final examination, there was some erythema noted to the skin of the radiation portals in the thoracic spine and lumbar spine areas. No skin breakdown.  Plan:  The patient will follow-up with radiation oncology in one month. In reference to his brain MRI results, we discussed that he may be a potential candidate for stereotactic radiosurgery. His case was presented to the brain tumor conference and was not felt to be metastatic disease to the brain. It was recommended that the patient receive a follow-up MRI in two months.  ________________________________________________   Blair Promise, PhD, MD  This document serves as a record of services personally performed by Gery Pray, MD. It was created on his behalf by Clerance Lav, a trained medical scribe. The creation of this record is based on the scribe's personal observations and the provider's statements to them. This document has been checked and approved by the attending provider.

## 2019-11-22 ENCOUNTER — Encounter: Payer: Self-pay | Admitting: Pharmacy Technician

## 2019-11-22 NOTE — Progress Notes (Signed)
Patient has been approved for drug assistance by [facility] for Imfinzi. The enrollment period is from 11/13/19-11/11/20 based on self pay. First DOS covered is to be scheduled (11/26/19).

## 2019-11-22 NOTE — Progress Notes (Signed)
Patient has been approved for drug assistance by [employer] for Udenyca. The enrollment period is from 11/09/19-11/08/20 based on self pay. First DOS covered is 11/09/19.

## 2019-11-24 ENCOUNTER — Other Ambulatory Visit: Payer: Self-pay

## 2019-11-24 ENCOUNTER — Ambulatory Visit (HOSPITAL_BASED_OUTPATIENT_CLINIC_OR_DEPARTMENT_OTHER)
Admission: RE | Admit: 2019-11-24 | Discharge: 2019-11-24 | Disposition: A | Discharge status: Home / Self Care | Payer: Medicaid Other | Source: Ambulatory Visit | Attending: Internal Medicine | Admitting: Internal Medicine

## 2019-11-24 DIAGNOSIS — G9389 Other specified disorders of brain: Secondary | ICD-10-CM | POA: Diagnosis present

## 2019-11-24 MED ORDER — GADOBUTROL 1 MMOL/ML IV SOLN
7.0000 mL | Freq: Once | INTRAVENOUS | Status: AC | PRN
  Administered 2019-11-24: 7 mL via INTRAVENOUS

## 2019-11-26 ENCOUNTER — Other Ambulatory Visit: Payer: Self-pay

## 2019-11-26 ENCOUNTER — Encounter: Payer: Self-pay | Admitting: Internal Medicine

## 2019-11-26 ENCOUNTER — Inpatient Hospital Stay: Payer: Medicaid Other

## 2019-11-26 ENCOUNTER — Inpatient Hospital Stay (HOSPITAL_BASED_OUTPATIENT_CLINIC_OR_DEPARTMENT_OTHER): Payer: Medicaid Other | Admitting: Internal Medicine

## 2019-11-26 ENCOUNTER — Encounter: Payer: Self-pay | Admitting: *Deleted

## 2019-11-26 ENCOUNTER — Telehealth: Payer: Self-pay | Admitting: Medical Oncology

## 2019-11-26 ENCOUNTER — Other Ambulatory Visit: Payer: Self-pay | Admitting: Internal Medicine

## 2019-11-26 ENCOUNTER — Inpatient Hospital Stay: Payer: Medicaid Other | Admitting: Nutrition

## 2019-11-26 VITALS — HR 99

## 2019-11-26 VITALS — BP 113/80 | HR 120 | Temp 98.5°F | Resp 20 | Ht 72.0 in | Wt 151.4 lb

## 2019-11-26 DIAGNOSIS — Z5112 Encounter for antineoplastic immunotherapy: Secondary | ICD-10-CM | POA: Diagnosis not present

## 2019-11-26 DIAGNOSIS — C3491 Malignant neoplasm of unspecified part of right bronchus or lung: Secondary | ICD-10-CM

## 2019-11-26 DIAGNOSIS — Z5111 Encounter for antineoplastic chemotherapy: Secondary | ICD-10-CM

## 2019-11-26 DIAGNOSIS — C7951 Secondary malignant neoplasm of bone: Secondary | ICD-10-CM

## 2019-11-26 LAB — CMP (CANCER CENTER ONLY)
ALT: 24 U/L (ref 0–44)
AST: 32 U/L (ref 15–41)
Albumin: 2.7 g/dL — ABNORMAL LOW (ref 3.5–5.0)
Alkaline Phosphatase: 197 U/L — ABNORMAL HIGH (ref 38–126)
Anion gap: 10 (ref 5–15)
BUN: 4 mg/dL — ABNORMAL LOW (ref 6–20)
CO2: 25 mmol/L (ref 22–32)
Calcium: 9.6 mg/dL (ref 8.9–10.3)
Chloride: 102 mmol/L (ref 98–111)
Creatinine: 0.64 mg/dL (ref 0.61–1.24)
GFR, Est AFR Am: >60 mL/min (ref >60)
GFR, Estimated: >60 mL/min (ref >60)
Glucose, Bld: 140 mg/dL — ABNORMAL HIGH (ref 70–99)
Potassium: 3.6 mmol/L (ref 3.5–5.1)
Sodium: 137 mmol/L (ref 135–145)
Total Bilirubin: 0.5 mg/dL (ref 0.3–1.2)
Total Protein: 7.9 g/dL (ref 6.5–8.1)

## 2019-11-26 LAB — CBC WITH DIFFERENTIAL (CANCER CENTER ONLY)
Abs Immature Granulocytes: 0.55 10*3/uL — ABNORMAL HIGH (ref 0.00–0.07)
Basophils Absolute: 0.1 10*3/uL (ref 0.0–0.1)
Basophils Relative: 1 %
Eosinophils Absolute: 0 10*3/uL (ref 0.0–0.5)
Eosinophils Relative: 0 %
HCT: 31.3 % — ABNORMAL LOW (ref 39.0–52.0)
Hemoglobin: 10.5 g/dL — ABNORMAL LOW (ref 13.0–17.0)
Immature Granulocytes: 5 %
Lymphocytes Relative: 11 %
Lymphs Abs: 1.2 10*3/uL (ref 0.7–4.0)
MCH: 28.4 pg (ref 26.0–34.0)
MCHC: 33.5 g/dL (ref 30.0–36.0)
MCV: 84.6 fL (ref 80.0–100.0)
Monocytes Absolute: 1 10*3/uL (ref 0.1–1.0)
Monocytes Relative: 10 %
Neutro Abs: 8 10*3/uL — ABNORMAL HIGH (ref 1.7–7.7)
Neutrophils Relative %: 73 %
Platelet Count: 232 10*3/uL (ref 150–400)
RBC: 3.7 MIL/uL — ABNORMAL LOW (ref 4.22–5.81)
RDW: 13.5 % (ref 11.5–15.5)
WBC Count: 10.9 10*3/uL — ABNORMAL HIGH (ref 4.0–10.5)
nRBC: 0 % (ref 0.0–0.2)

## 2019-11-26 LAB — TSH: TSH: 1.312 u[IU]/mL (ref 0.320–4.118)

## 2019-11-26 MED ORDER — PALONOSETRON HCL INJECTION 0.25 MG/5ML
0.2500 mg | Freq: Once | INTRAVENOUS | Status: AC
  Administered 2019-11-26: 0.25 mg via INTRAVENOUS

## 2019-11-26 MED ORDER — SODIUM CHLORIDE 0.9 % IV SOLN
150.0000 mg | Freq: Once | INTRAVENOUS | Status: AC
  Administered 2019-11-26: 150 mg via INTRAVENOUS
  Filled 2019-11-26: qty 150

## 2019-11-26 MED ORDER — SODIUM CHLORIDE 0.9 % IV SOLN
Freq: Once | INTRAVENOUS | Status: AC
  Filled 2019-11-26: qty 250

## 2019-11-26 MED ORDER — SODIUM CHLORIDE 0.9 % IV SOLN
700.0000 mg | Freq: Once | INTRAVENOUS | Status: AC
  Administered 2019-11-26: 700 mg via INTRAVENOUS
  Filled 2019-11-26: qty 70

## 2019-11-26 MED ORDER — SODIUM CHLORIDE 0.9 % IV SOLN
100.0000 mg/m2 | Freq: Once | INTRAVENOUS | Status: AC
  Administered 2019-11-26: 190 mg via INTRAVENOUS
  Filled 2019-11-26: qty 9.5

## 2019-11-26 MED ORDER — CYCLOBENZAPRINE HCL 10 MG PO TABS: 10.0000 mg | ORAL_TABLET | Freq: Three times a day (TID) | ORAL | 0 refills | Status: DC | PRN

## 2019-11-26 MED ORDER — DEXAMETHASONE SODIUM PHOSPHATE 10 MG/ML IJ SOLN
INTRAMUSCULAR | Status: AC
  Filled 2019-11-26: qty 1

## 2019-11-26 MED ORDER — PROCHLORPERAZINE MALEATE 10 MG PO TABS: 10.0000 mg | ORAL_TABLET | Freq: Four times a day (QID) | ORAL | 2 refills | Status: DC | PRN

## 2019-11-26 MED ORDER — PALONOSETRON HCL INJECTION 0.25 MG/5ML
INTRAVENOUS | Status: AC
  Filled 2019-11-26: qty 5

## 2019-11-26 MED ORDER — SODIUM CHLORIDE 0.9 % IV SOLN
1500.0000 mg | Freq: Once | INTRAVENOUS | Status: AC
  Administered 2019-11-26: 1500 mg via INTRAVENOUS
  Filled 2019-11-26: qty 30

## 2019-11-26 NOTE — Telephone Encounter (Signed)
flexeril called to paharmacy.

## 2019-11-26 NOTE — Patient Instructions (Signed)
[facility] Discharge Instructions for Patients Receiving Chemotherapy  Today you received the following chemotherapy agents Imfinzi, Carboplatin, and Etoposide  To help prevent nausea and vomiting after your treatment, we encourage you to take your nausea medication as prescribed.   If you develop nausea and vomiting that is not controlled by your nausea medication, call the clinic.   BELOW ARE SYMPTOMS THAT SHOULD BE REPORTED IMMEDIATELY:  *FEVER GREATER THAN 100.5 F  *CHILLS WITH OR WITHOUT FEVER  NAUSEA AND VOMITING THAT IS NOT CONTROLLED WITH YOUR NAUSEA MEDICATION  *UNUSUAL SHORTNESS OF BREATH  *UNUSUAL BRUISING OR BLEEDING  TENDERNESS IN MOUTH AND THROAT WITH OR WITHOUT PRESENCE OF ULCERS  *URINARY PROBLEMS  *BOWEL PROBLEMS  UNUSUAL RASH Items with * indicate a potential emergency and should be followed up as soon as possible.  Feel free to call the clinic should you have any questions or concerns. The clinic phone number is [phone].  Please show the [facility] at check-in to the Emergency Department and triage nurse.  Durvalumab injection What is this medicine? DURVALUMAB (dur VAL ue mab) is a monoclonal antibody. It is used to treat urothelial cancer and lung cancer. This medicine may be used for other purposes; ask your health care provider or pharmacist if you have questions. COMMON BRAND NAME(S): IMFINZI What should I tell my health care provider before I take this medicine? They need to know if you have any of these conditions:  diabetes  immune system problems  infection  inflammatory bowel disease  kidney disease  liver disease  lung or breathing disease  lupus  organ transplant  stomach or intestine problems  thyroid disease  an unusual or allergic reaction to durvalumab, other medicines, foods, dyes, or preservatives  pregnant or trying to get pregnant  breast-feeding How should I use this medicine? This  medicine is for infusion into a vein. It is given by a health care professional in a hospital or clinic setting. A special MedGuide will be given to you before each treatment. Be sure to read this information carefully each time. Talk to your pediatrician regarding the use of this medicine in children. Special care may be needed. Overdosage: If you think you have taken too much of this medicine contact a poison control center or emergency room at once. NOTE: This medicine is only for you. Do not share this medicine with others. What if I miss a dose? It is important not to miss your dose. Call your doctor or health care professional if you are unable to keep an appointment. What may interact with this medicine? Interactions have not been studied. This list may not describe all possible interactions. Give your health care provider a list of all the medicines, herbs, non-prescription drugs, or dietary supplements you use. Also tell them if you smoke, drink alcohol, or use illegal drugs. Some items may interact with your medicine. What should I watch for while using this medicine? This drug may make you feel generally unwell. Continue your course of treatment even though you feel ill unless your doctor tells you to stop. You may need blood work done while you are taking this medicine. Do not become pregnant while taking this medicine or for 3 months after stopping it. Women should inform their doctor if they wish to become pregnant or think they might be pregnant. There is a potential for serious side effects to an unborn child. Talk to your health care professional or pharmacist for more information.  Do not breast-feed an infant while taking this medicine or for 3 months after stopping it. What side effects may I notice from receiving this medicine? Side effects that you should report to your doctor or health care professional as soon as possible:  allergic reactions like skin rash, itching or hives,  swelling of the face, lips, or tongue  black, tarry stools  bloody or watery diarrhea  breathing problems  change in emotions or moods  change in sex drive  changes in vision  chest pain or chest tightness  chills  confusion  cough  facial flushing  fever  headache  signs and symptoms of high blood sugar such as dizziness; dry mouth; dry skin; fruity breath; nausea; stomach pain; increased hunger or thirst; increased urination  signs and symptoms of liver injury like dark yellow or brown urine; general ill feeling or flu-like symptoms; light-colored stools; loss of appetite; nausea; right upper belly pain; unusually weak or tired; yellowing of the eyes or skin  stomach pain  trouble passing urine or change in the amount of urine  weight gain or weight loss Side effects that usually do not require medical attention (report these to your doctor or health care professional if they continue or are bothersome):  bone pain  constipation  loss of appetite  muscle pain  nausea  swelling of the ankles, feet, hands  tiredness This list may not describe all possible side effects. Call your doctor for medical advice about side effects. You may report side effects to FDA at 1-800-FDA-1088. Where should I keep my medicine? This drug is given in a hospital or clinic and will not be stored at home. NOTE: This sheet is a summary. It may not cover all possible information. If you have questions about this medicine, talk to your doctor, pharmacist, or health care provider.  2020 Elsevier/Gold Standard (2016-11-02 19:25:04)  Carboplatin injection What is this medicine? CARBOPLATIN (KAR boe pla tin) is a chemotherapy drug. It targets fast dividing cells, like cancer cells, and causes these cells to die. This medicine is used to treat ovarian cancer and many other cancers. This medicine may be used for other purposes; ask your health care provider or pharmacist if you have  questions. COMMON BRAND NAME(S): Paraplatin What should I tell my health care provider before I take this medicine? They need to know if you have any of these conditions:  blood disorders  hearing problems  kidney disease  recent or ongoing radiation therapy  an unusual or allergic reaction to carboplatin, cisplatin, other chemotherapy, other medicines, foods, dyes, or preservatives  pregnant or trying to get pregnant  breast-feeding How should I use this medicine? This drug is usually given as an infusion into a vein. It is administered in a hospital or clinic by a specially trained health care professional. Talk to your pediatrician regarding the use of this medicine in children. Special care may be needed. Overdosage: If you think you have taken too much of this medicine contact a poison control center or emergency room at once. NOTE: This medicine is only for you. Do not share this medicine with others. What if I miss a dose? It is important not to miss a dose. Call your doctor or health care professional if you are unable to keep an appointment. What may interact with this medicine?  medicines for seizures  medicines to increase blood counts like filgrastim, pegfilgrastim, sargramostim  some antibiotics like amikacin, gentamicin, neomycin, streptomycin, tobramycin  vaccines Talk to  your doctor or health care professional before taking any of these medicines:  acetaminophen  aspirin  ibuprofen  ketoprofen  naproxen This list may not describe all possible interactions. Give your health care provider a list of all the medicines, herbs, non-prescription drugs, or dietary supplements you use. Also tell them if you smoke, drink alcohol, or use illegal drugs. Some items may interact with your medicine. What should I watch for while using this medicine? Your condition will be monitored carefully while you are receiving this medicine. You will need important blood work done  while you are taking this medicine. This drug may make you feel generally unwell. This is not uncommon, as chemotherapy can affect healthy cells as well as cancer cells. Report any side effects. Continue your course of treatment even though you feel ill unless your doctor tells you to stop. In some cases, you may be given additional medicines to help with side effects. Follow all directions for their use. Call your doctor or health care professional for advice if you get a fever, chills or sore throat, or other symptoms of a cold or flu. Do not treat yourself. This drug decreases your body's ability to fight infections. Try to avoid being around people who are sick. This medicine may increase your risk to bruise or bleed. Call your doctor or health care professional if you notice any unusual bleeding. Be careful brushing and flossing your teeth or using a toothpick because you may get an infection or bleed more easily. If you have any dental work done, tell your dentist you are receiving this medicine. Avoid taking products that contain aspirin, acetaminophen, ibuprofen, naproxen, or ketoprofen unless instructed by your doctor. These medicines may hide a fever. Do not become pregnant while taking this medicine. Women should inform their doctor if they wish to become pregnant or think they might be pregnant. There is a potential for serious side effects to an unborn child. Talk to your health care professional or pharmacist for more information. Do not breast-feed an infant while taking this medicine. What side effects may I notice from receiving this medicine? Side effects that you should report to your doctor or health care professional as soon as possible:  allergic reactions like skin rash, itching or hives, swelling of the face, lips, or tongue  signs of infection - fever or chills, cough, sore throat, pain or difficulty passing urine  signs of decreased platelets or bleeding - bruising, pinpoint  red spots on the skin, black, tarry stools, nosebleeds  signs of decreased red blood cells - unusually weak or tired, fainting spells, lightheadedness  breathing problems  changes in hearing  changes in vision  chest pain  high blood pressure  low blood counts - This drug may decrease the number of white blood cells, red blood cells and platelets. You may be at increased risk for infections and bleeding.  nausea and vomiting  pain, swelling, redness or irritation at the injection site  pain, tingling, numbness in the hands or feet  problems with balance, talking, walking  trouble passing urine or change in the amount of urine Side effects that usually do not require medical attention (report to your doctor or health care professional if they continue or are bothersome):  hair loss  loss of appetite  metallic taste in the mouth or changes in taste This list may not describe all possible side effects. Call your doctor for medical advice about side effects. You may report side effects to FDA  at 1-800-FDA-1088. Where should I keep my medicine? This drug is given in a hospital or clinic and will not be stored at home. NOTE: This sheet is a summary. It may not cover all possible information. If you have questions about this medicine, talk to your doctor, pharmacist, or health care provider.  2020 Elsevier/Gold Standard (2007-11-28 14:38:05)  Etoposide, VP-16 injection What is this medicine? ETOPOSIDE, VP-16 (e toe POE side) is a chemotherapy drug. It is used to treat testicular cancer, lung cancer, and other cancers. This medicine may be used for other purposes; ask your health care provider or pharmacist if you have questions. COMMON BRAND NAME(S): Etopophos, Toposar, VePesid What should I tell my health care provider before I take this medicine? They need to know if you have any of these conditions:  infection  kidney disease  liver disease  low blood counts, like low  white cell, platelet, or red cell counts  an unusual or allergic reaction to etoposide, other medicines, foods, dyes, or preservatives  pregnant or trying to get pregnant  breast-feeding How should I use this medicine? This medicine is for infusion into a vein. It is administered in a hospital or clinic by a specially trained health care professional. Talk to your pediatrician regarding the use of this medicine in children. Special care may be needed. Overdosage: If you think you have taken too much of this medicine contact a poison control center or emergency room at once. NOTE: This medicine is only for you. Do not share this medicine with others. What if I miss a dose? It is important not to miss your dose. Call your doctor or health care professional if you are unable to keep an appointment. What may interact with this medicine? This medicine may interact with the following medications:  warfarin This list may not describe all possible interactions. Give your health care provider a list of all the medicines, herbs, non-prescription drugs, or dietary supplements you use. Also tell them if you smoke, drink alcohol, or use illegal drugs. Some items may interact with your medicine. What should I watch for while using this medicine? Visit your doctor for checks on your progress. This drug may make you feel generally unwell. This is not uncommon, as chemotherapy can affect healthy cells as well as cancer cells. Report any side effects. Continue your course of treatment even though you feel ill unless your doctor tells you to stop. In some cases, you may be given additional medicines to help with side effects. Follow all directions for their use. Call your doctor or health care professional for advice if you get a fever, chills or sore throat, or other symptoms of a cold or flu. Do not treat yourself. This drug decreases your body's ability to fight infections. Try to avoid being around people who  are sick. This medicine may increase your risk to bruise or bleed. Call your doctor or health care professional if you notice any unusual bleeding. Talk to your doctor about your risk of cancer. You may be more at risk for certain types of cancers if you take this medicine. Do not become pregnant while taking this medicine or for at least 6 months after stopping it. Women should inform their doctor if they wish to become pregnant or think they might be pregnant. Women of child-bearing potential will need to have a negative pregnancy test before starting this medicine. There is a potential for serious side effects to an unborn child. Talk to your health  care professional or pharmacist for more information. Do not breast-feed an infant while taking this medicine. Men must use a latex condom during sexual contact with a woman while taking this medicine and for at least 4 months after stopping it. A latex condom is needed even if you have had a vasectomy. Contact your doctor right away if your partner becomes pregnant. Do not donate sperm while taking this medicine and for at least 4 months after you stop taking this medicine. Men should inform their doctors if they wish to father a child. This medicine may lower sperm counts. What side effects may I notice from receiving this medicine? Side effects that you should report to your doctor or health care professional as soon as possible:  allergic reactions like skin rash, itching or hives, swelling of the face, lips, or tongue  low blood counts - this medicine may decrease the number of white blood cells, red blood cells, and platelets. You may be at increased risk for infections and bleeding  nausea, vomiting  redness, blistering, peeling or loosening of the skin, including inside the mouth  signs and symptoms of infection like fever; chills; cough; sore throat; pain or trouble passing urine  signs and symptoms of low red blood cells or anemia such as  unusually weak or tired; feeling faint or lightheaded; falls; breathing problems  unusual bruising or bleeding Side effects that usually do not require medical attention (report to your doctor or health care professional if they continue or are bothersome):  changes in taste  diarrhea  hair loss  loss of appetite  mouth sores This list may not describe all possible side effects. Call your doctor for medical advice about side effects. You may report side effects to FDA at 1-800-FDA-1088. Where should I keep my medicine? This drug is given in a hospital or clinic and will not be stored at home. NOTE: This sheet is a summary. It may not cover all possible information. If you have questions about this medicine, talk to your doctor, pharmacist, or health care provider.  2020 Elsevier/Gold Standard (2018-10-18 16:57:15)

## 2019-11-26 NOTE — Progress Notes (Signed)
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  He is a patient of Dr. Julien Nordmann.  Past medical history includes bipolar disorder, depression, GERD, and hepatitis C.  Medications include Compazine and Carafate.  Labs include glucose 140 and albumin 2.7 on March 22.  Height: 6 feet 0 inches. Weight: 151.4 pounds March 22. Usual body weight: 180 pounds per patient. BMI: 20.53.  Patient admits to 29 pound weight loss. Reports taste alterations contributing to decreased appetite and poor oral intake. 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 Tolerates boost without difficulty.  Nutrition diagnosis:  Unintended weight loss related to metastatic cancer as evidenced by 16% weight loss from usual body weight.  Intervention: Educated patient on the importance of choosing higher calorie, higher protein foods in 6 small meals and snacks daily. Reviewed potential snacks. Brief education provided on improving taste alterations. Encourage patient to change oral nutrition supplement to high-calorie high-protein choice and consume between meals. Provided fact sheets.  Questions were answered.  Teach back method used.  Contact information provided.  Monitoring, evaluation, goals: Patient will tolerate increased calories and protein to minimize further weight loss.  Next visit: To be scheduled with treatment as needed.  **Disclaimer: This note was dictated with voice recognition software. Similar sounding words can inadvertently be transcribed and this note may contain transcription errors which may not have been corrected upon publication of note.**

## 2019-11-26 NOTE — Progress Notes (Signed)
Oncology Nurse Navigator Documentation  Oncology Nurse Navigator Flowsheets 11/26/2019  Abnormal Finding Date 09/25/2019  Confirmed Diagnosis Date 10/12/2019  Diagnosis Status Confirmed Diagnosis Complete  Planned Course of Treatment Chemotherapy;Radiation  Phase of Treatment Radiation  Chemotherapy Pending- Reason: Patient Request/Initiated  Chemotherapy Actual Start Date: 11/12/2019  Radiation Actual Start Date: 10/15/2019  Radiation Actual End Date: 11/05/2019  Navigation Complete Date: 11/26/2019  Post Navigation: Continue to Follow Patient? No  Reason Not Navigating Patient: Patient On Maintenance Chemotherapy  Navigator Location [facility]  Navigator Encounter Type Clinic/MDC/spoke with patient and his wife today at clinic.  Nicolas Welch is doing better.  He is not in a wheelchair nor sleeping during the visit.  It was great seeing him do so well.  I will update CSW that he is doing better today.    Telephone -  Treatment Initiated Date 10/15/2019  Patient Visit Type MedOnc  Treatment Phase Treatment  Barriers/Navigation Needs Education  Education Other  Interventions Education;Psycho-Social Support  Acuity Level 2-Minimal Needs (1-2 Barriers Identified)  Coordination of Care -  Education Method Verbal  Time Spent with Patient 30

## 2019-11-26 NOTE — Patient Instructions (Signed)
Steps to Quit Smoking Smoking tobacco is the leading cause of preventable death. It can affect almost every organ in the body. Smoking puts you and people around you at risk for many serious, long-lasting (chronic) diseases. Quitting smoking can be hard, but it is one of the best things that you can do for your health. It is never too late to quit. How do I get ready to quit? When you decide to quit smoking, make a plan to help you succeed. Before you quit:  Pick a date to quit. Set a date within the next 2 weeks to give you time to prepare.  Write down the reasons why you are quitting. Keep this list in places where you will see it often.  Tell your family, friends, and co-workers that you are quitting. Their support is important.  Talk with your doctor about the choices that may help you quit.  Find out if your health insurance will pay for these treatments.  Know the people, places, things, and activities that make you want to smoke (triggers). Avoid them. What first steps can I take to quit smoking?  Throw away all cigarettes at home, at work, and in your car.  Throw away the things that you use when you smoke, such as ashtrays and lighters.  Clean your car. Make sure to empty the ashtray.  Clean your home, including curtains and carpets. What can I do to help me quit smoking? Talk with your doctor about taking medicines and seeing a counselor at the same time. You are more likely to succeed when you do both.  If you are pregnant or breastfeeding, talk with your doctor about counseling or other ways to quit smoking. Do not take medicine to help you quit smoking unless your doctor tells you to do so. To quit smoking: Quit right away  Quit smoking totally, instead of slowly cutting back on how much you smoke over a period of time.  Go to counseling. You are more likely to quit if you go to counseling sessions regularly. Take medicine You may take medicines to help you quit. Some  medicines need a prescription, and some you can buy over-the-counter. Some medicines may contain a drug called nicotine to replace the nicotine in cigarettes. Medicines may:  Help you to stop having the desire to smoke (cravings).  Help to stop the problems that come when you stop smoking (withdrawal symptoms). Your doctor may ask you to use:  Nicotine patches, gum, or lozenges.  Nicotine inhalers or sprays.  Non-nicotine medicine that is taken by mouth. Find resources Find resources and other ways to help you quit smoking and remain smoke-free after you quit. These resources are most helpful when you use them often. They include:  Online chats with a counselor.  Phone quitlines.  Printed self-help materials.  Support groups or group counseling.  Text messaging programs.  Mobile phone apps. Use apps on your mobile phone or tablet that can help you stick to your quit plan. There are many free apps for mobile phones and tablets as well as websites. Examples include Quit Guide from the CDC and smokefree.gov  What things can I do to make it easier to quit?   Talk to your family and friends. Ask them to support and encourage you.  Call a phone quitline (1-800-QUIT-NOW), reach out to support groups, or work with a counselor.  Ask people who smoke to not smoke around you.  Avoid places that make you want to smoke,   such as: ? Bars. ? Parties. ? Smoke-break areas at work.  Spend time with people who do not smoke.  Lower the stress in your life. Stress can make you want to smoke. Try these things to help your stress: ? Getting regular exercise. ? Doing deep-breathing exercises. ? Doing yoga. ? Meditating. ? Doing a body scan. To do this, close your eyes, focus on one area of your body at a time from head to toe. Notice which parts of your body are tense. Try to relax the muscles in those areas. How will I feel when I quit smoking? Day 1 to 3 weeks Within the first 24 hours,  you may start to have some problems that come from quitting tobacco. These problems are very bad 2-3 days after you quit, but they do not often last for more than 2-3 weeks. You may get these symptoms:  Mood swings.  Feeling restless, nervous, angry, or annoyed.  Trouble concentrating.  Dizziness.  Strong desire for high-sugar foods and nicotine.  Weight gain.  Trouble pooping (constipation).  Feeling like you may vomit (nausea).  Coughing or a sore throat.  Changes in how the medicines that you take for other issues work in your body.  Depression.  Trouble sleeping (insomnia). Week 3 and afterward After the first 2-3 weeks of quitting, you may start to notice more positive results, such as:  Better sense of smell and taste.  Less coughing and sore throat.  Slower heart rate.  Lower blood pressure.  Clearer skin.  Better breathing.  Fewer sick days. Quitting smoking can be hard. Do not give up if you fail the first time. Some people need to try a few times before they succeed. Do your best to stick to your quit plan, and talk with your doctor if you have any questions or concerns. Summary  Smoking tobacco is the leading cause of preventable death. Quitting smoking can be hard, but it is one of the best things that you can do for your health.  When you decide to quit smoking, make a plan to help you succeed.  Quit smoking right away, not slowly over a period of time.  When you start quitting, seek help from your doctor, family, or friends. This information is not intended to replace advice given to you by your health care provider. Make sure you discuss any questions you have with your health care provider. Document Revised: 05/18/2019 Document Reviewed: 11/11/2018 Elsevier Patient Education  2020 Elsevier Inc.  

## 2019-11-26 NOTE — Progress Notes (Signed)
Nicolas Welch, Nicolas Welch,Nicolas Welch Welch to theleft jugular tubercle and jugular foramen,and lymphadenopathy in the abdomen. He was diagnosed in January 2021.   PRIOR THERAPY: Palliative radiotherapy to the painful bone Nicolas Welch lesions under the care of Dr. Sondra Come. Last treatment on 11/07/19  CURRENT THERAPY: palliative systemic chemotherapy with carboplatin for an AUC of 5 on day 1, etoposide 100 mg/m on days 1, 2, and 3, and Imfinzi 1500 mg on day 1 IV every 3 weeks with Neulasta support. First dose on 11/05/2019. Status post 1 cycle.   INTERVAL HISTORY: Nicolas Welch 49 y.o. male returns to the clinic today for follow-up visit accompanied by his wife.  The Welch is feeling fine today except for fatigue and low back pain with numbness and the right leg.  He is scheduled to see Dr. Maryjean Ka next week for pain management.  He denied having any current chest pain but has shortness of breath with exertion with Nicolas cough or hemoptysis.  He denied having any fever or chills.  He has Nicolas nausea, vomiting, diarrhea or constipation.  He denied having any headache or visual changes.  He is here today for evaluation before starting cycle #2.  MEDICAL HISTORY: Past Medical History:  Diagnosis Date  . Anginal pain ([place])   . Bipolar disorder ([place])   . Cancer ([place])     " lung and Nicolas Welch bone cancer "  . Depression   . GERD (gastroesophageal reflux Welch)   . Hepatitis    HEP C  . Lung mass     ALLERGIES:  is allergic to egg [eggs or egg-derived products]; milk-related compounds;  and wheat bran.  MEDICATIONS:  Current Outpatient Medications  Medication Sig Dispense Refill  . albuterol (PROVENTIL) (2.5 MG/3ML) 0.083% nebulizer solution Take 3 mLs (2.5 mg total) by nebulization every 6 (six) hours as needed for wheezing or shortness of breath. 75 mL 12  . cephALEXin (KEFLEX) 500 MG capsule Take 1 capsule (500 mg total) by mouth 4 (four) times daily. 20 capsule 0  . cyclobenzaprine (FLEXERIL) 10 MG tablet Take 1 tablet (10 mg total) by mouth 3 (three) times daily as needed for muscle spasms. 90 tablet 0  . fentaNYL (DURAGESIC) 75 MCG/HR Place 1 patch onto the skin every 3 (three) days. 5 patch 0  . Fluticasone-Umeclidin-Vilant (TRELEGY ELLIPTA) 100-62.5-25 MCG/INH AEPB Inhale 1 puff into the lungs daily. 28 each 0  . HYDROmorphone (DILAUDID) 8 MG tablet Take 1 tablet (8 mg total) by mouth every 4 (four) hours as needed for severe pain. (Welch not taking: Reported on 11/12/2019) 30 tablet 0  . ibuprofen (ADVIL) 200 MG tablet Take 800 mg by mouth every 6 (six) hours as needed for moderate pain.     Marland Kitchen oxyCODONE-acetaminophen (PERCOCET) 10-325 MG tablet Take 1 tablet by mouth every 6 (six) hours as needed for pain. 30 tablet 0  . prochlorperazine (COMPAZINE) 10 MG tablet Take 1 tablet (10 mg total) by mouth every 6 (six) hours as needed for nausea or vomiting. 30 tablet 2  . sucralfate (CARAFATE) 1 g tablet Take 1 tablet (1 g total) by  mouth 4 (four) times daily -  with meals and at bedtime. Crush & dissolve in 10 mL of warm water prior to swallowing 60 tablet 1   Nicolas current facility-administered medications for this visit.    SURGICAL HISTORY:  Past Surgical History:  Procedure Laterality Date  . APPENDECTOMY    . BRONCHIAL BIOPSY  10/12/2019   Procedure: BRONCHIAL BIOPSIES;  Surgeon: [name], DO;  Location: [facility];  Service: Thoracic;;  . BRONCHIAL NEEDLE ASPIRATION BIOPSY  10/12/2019   Procedure: BRONCHIAL NEEDLE ASPIRATION BIOPSIES;  Surgeon: [name], DO;  Location: [facility];  Service: Thoracic;;  . BRONCHIAL WASHINGS  10/12/2019   Procedure: BRONCHIAL WASHINGS;  Surgeon: [name], DO;  Location: [facility];  Service: Thoracic;;  . TONSILLECTOMY AND ADENOIDECTOMY    . VIDEO BRONCHOSCOPY WITH ENDOBRONCHIAL ULTRASOUND N/A 10/12/2019   Procedure: VIDEO BRONCHOSCOPY WITH ENDOBRONCHIAL ULTRASOUND;  Surgeon: [name], DO;  Location[facility];  Service: Thoracic;  Laterality: N/A;  . WISDOM TOOTH EXTRACTION      REVIEW OF SYSTEMS:  A comprehensive review of systems was negative except for: Constitutional: positive for fatigue and weight loss Musculoskeletal: positive for back pain and muscle weakness Neurological: positive for paresthesia   PHYSICAL EXAMINATION: General appearance: alert, cooperative, fatigued and Nicolas distress Head: Normocephalic, without obvious abnormality, atraumatic Neck: Nicolas adenopathy, Nicolas JVD, supple, symmetrical, trachea midline and thyroid not enlarged, symmetric, Nicolas tenderness/mass/nodules Lymph nodes: Cervical, supraclavicular, and axillary nodes normal. Resp: clear to auscultation bilaterally Back: symmetric, Nicolas curvature. ROM normal. Nicolas CVA tenderness. Cardio: regular rate and rhythm, S1, S2 normal, Nicolas murmur, click, rub or gallop GI: soft, non-tender; bowel sounds normal; Nicolas masses,  Nicolas organomegaly Extremities: extremities normal, atraumatic, Nicolas cyanosis or edema  ECOG PERFORMANCE STATUS: 1 - Symptomatic but completely ambulatory  Blood pressure 113/80, pulse (!) 120, temperature 98.5 F (36.9 C), temperature source Oral, resp. rate 20, height 6' (1.829 m), weight 151 lb 6.4 oz (68.7 kg), SpO2 96 %.  LABORATORY DATA: Lab Results  Component Value Date   WBC 10.9 (H) 11/26/2019   HGB 10.5 (L) 11/26/2019   HCT 31.3 (L) 11/26/2019   MCV 84.6 11/26/2019   PLT 232 11/26/2019      Chemistry      Component Value Date/Time   NA 134 (L) 11/12/2019 1052   K 4.1 11/12/2019 1052   CL 101  11/12/2019 1052   CO2 25 11/12/2019 1052   BUN 14 11/12/2019 1052   CREATININE 0.65 11/12/2019 1052      Component Value Date/Time   CALCIUM 8.9 11/12/2019 1052   ALKPHOS 217 (H) 11/12/2019 1052   AST 31 11/12/2019 1052   ALT 39 11/12/2019 1052   BILITOT 0.8 11/12/2019 1052       RADIOGRAPHIC STUDIES: MR BRAIN W WO CONTRAST  Result Date: 11/25/2019 CLINICAL DATA:  Brain mass. Brain/CNS neoplasm, surveillance. Additional history obtained from [facility] NUMBERHistory of Nicolas Welch lung cancer. EXAM: MRI HEAD WITHOUT AND WITH CONTRAST TECHNIQUE: Multiplanar, multiecho pulse sequences of the brain and surrounding structures were obtained without and with intravenous contrast. CONTRAST:  17mL GADAVIST GADOBUTROL 1 MMOL/ML IV SOLN COMPARISON:  Brain MRI 10/15/2019 FINDINGS: Brain: Again demonstrated is a masslike region of swelling at the left cerebellopontine angle which demonstrates only subtle T2/FLAIR hyperintensity. Unchanged, the mass measures 2 cm (series 7, image 11). There is Nicolas associated abnormal enhancement. An avidly enhancing mass centered at the left jugular foramen is stable to minimally increased  in size as compared to 10/15/2019, now 2.7 cm (previously 2.6 cm, remeasured on prior) (series 10, image 16) (series 11, image 14) Nicolas evidence of acute infarct. Nicolas midline shift or extra-axial fluid collection. A few nonspecific punctate chronic microhemorrhages are again demonstrated within the supratentorial and infratentorial brain. Scattered T2/FLAIR hyperintensity within the cerebral white matter is nonspecific, but consistent with chronic small vessel ischemic Welch. Cerebral volume is normal for age. Vascular: Flow voids maintained within the proximal large arterial vessels. As before, there is diminished flow within the upper left internal jugular vein attributed to Nicolas Welch Welch at the level of the jugular foramen. Skull and upper cervical spine: 3.0 cm avidly enhancing  mass at the left jugular foramen as described. Sinuses/Orbits: Visualized orbits demonstrate Nicolas acute abnormality. Nicolas significant paranasal sinus Welch or mastoid effusion. Other: Incidentally noted Tornwaldt cyst. IMPRESSION: 1. An avidly enhancing metastasis centered at the left jugular foramen is stable to minimally increased in size as compared to MRI 10/15/2019, now 2.7 cm (previously 2.6 cm). 2. Stable nonenhancing 2 cm intra-axial mass at the left cerebellopontine angle, which is indeterminate in etiology. The possibility of an incidental infiltrating glioma is again raised. 3. Nicolas new intracranial abnormality. Electronically Signed   By: Kellie Simmering DO   On: 11/25/2019 17:11   NM PET Image Initial (PI) Skull Base To Thigh  Result Date: 11/02/2019 CLINICAL DATA:  Initial treatment strategy for non-small-cell lung cancer. Staging. EXAM: NUCLEAR MEDICINE PET SKULL BASE TO THIGH TECHNIQUE: 8.2 mCi F-18 FDG was injected intravenously. Full-ring PET imaging was performed from the skull base to thigh after the radiotracer. CT data was obtained and used for attenuation correction and anatomic localization. Fasting blood glucose: 97 mg/dl COMPARISON:  Chest abdomen and pelvic CTs 10/03/2019 FINDINGS: Mediastinal blood pool activity: SUV max 2.4 Liver activity: SUV max NA NECK: Hypermetabolism corresponding to the previously described soft tissue mass at the left jugular tubercle. Example 2.5 cm and a S.U.V. max of 10.2 on 05/04. Nicolas cervical nodal hypermetabolism. Incidental CT findings: Right carotid atherosclerosis which is significantly age advanced. Nicolas cervical adenopathy. CHEST: Extensive thoracic hypermetabolic adenopathy. Example subcarinal nodal mass at 2.6 cm and a S.U.V. max of 6.0 on 67/4. This is enlarged from 2.4 cm on 10/03/2019. Progression of presumed central right lower lobe lung mass and surrounding collapse/consolidation. The mass is difficult to differentiate from the surrounding  postobstructive consolidation. Measures on the order of a S.U.V. max of 7.4, including on 45/8. Incidental CT findings: Posterior right upper lobe 5 mm pulmonary nodule on 27/8, similar. Superior segment right lower lobe pulmonary nodule on 27/8, 4 mm and similar. Aortic and coronary artery atherosclerosis. ABDOMEN/PELVIS: Right hepatic lobe lesion is relatively CT occult, corresponds to a too small to characterize lesion on the prior contrast enhanced CT. Measures a S.U.V. max of 6.3, including on 111/4. A focus of hypermetabolism about the terminal ileum is without CT correlate, favored to be physiologic at a S.U.V. max of 10.1. Incidental CT findings: Abdominal aortic atherosclerosis. Large colonic stool burden. Normal adrenal glands. Suspect a higher subcentimeter low-density right hepatic lobe lesion on 93/4, nonspecific. SKELETON: Widespread osseous metastasis. Index right ischial lytic lesion measures 3.1 cm and a S.U.V. max of 8.2 on 185/4. An eighth rib osseous and soft tissue mass measures 4.3 x 3.0 cm and a S.U.V. max of 8.5 on 71/4. Incidental CT findings: none IMPRESSION: 1. Right lower lobe primary bronchogenic carcinoma with Nicolas Welch Welch to thoracic nodes, bones, and liver as detailed  above. Welch progression since 10/03/2019 as detailed above. 2. Coronary artery atherosclerosis. Aortic Atherosclerosis (ICD10-I70.0). 3. Small pulmonary nodules, suspicious.  Below PET resolution. Electronically Signed   By: Abigail Miyamoto M.D.   On: 11/02/2019 12:59    ASSESSMENT AND PLAN: This is a very pleasant 49 years old white male recently diagnosed with extensive stage small cell lung cancer and currently undergoing systemic chemotherapy with carboplatin, etoposide and Imfinzi status post 1 cycle.  He tolerated the first cycle of his treatment well except for fatigue. I recommended for him to proceed with cycle #2 today as planned. For pain management he will see Dr. Maryjean Ka for evaluation and  management of his pain issues. I will see the Welch back for follow-up visit in 3 weeks for evaluation with repeat CT scan of the chest, abdomen pelvis for restaging of his Welch. I will give him refill of Compazine and Flexeril today. The Welch was advised to call immediately if he has any concerning symptoms in the interval. The Welch voices understanding of current Welch status and treatment options and is in agreement with the current care plan.  All questions were answered. The Welch knows to call the clinic with any problems, questions or concerns. We can certainly see the Welch much sooner if necessary.  Disclaimer: This note was dictated with voice recognition software. Similar sounding words can inadvertently be transcribed and may not be corrected upon review.

## 2019-11-27 ENCOUNTER — Inpatient Hospital Stay: Payer: Medicaid Other

## 2019-11-27 ENCOUNTER — Telehealth: Payer: Self-pay | Admitting: Medical Oncology

## 2019-11-27 NOTE — Telephone Encounter (Signed)
Chemo Day 2 and 3 vp-16-Pt Cancelled chemo today:  "I just don't feel good . My hips hurt" . Day 2 and 3 r/s wed and thursday . Pt aware.   Injection He said he cannot come on Friday for injection  because he has to go to court in [place].  Schedule message sent to r/s injection for Saturday.

## 2019-11-28 ENCOUNTER — Telehealth: Payer: Self-pay | Admitting: Internal Medicine

## 2019-11-28 ENCOUNTER — Other Ambulatory Visit: Payer: Self-pay

## 2019-11-28 ENCOUNTER — Inpatient Hospital Stay: Payer: Medicaid Other

## 2019-11-28 VITALS — BP 108/77 | HR 96 | Temp 99.8°F | Resp 20

## 2019-11-28 DIAGNOSIS — C3491 Malignant neoplasm of unspecified part of right bronchus or lung: Secondary | ICD-10-CM

## 2019-11-28 DIAGNOSIS — Z5112 Encounter for antineoplastic immunotherapy: Secondary | ICD-10-CM | POA: Diagnosis not present

## 2019-11-28 MED ORDER — DEXAMETHASONE SODIUM PHOSPHATE 10 MG/ML IJ SOLN
INTRAMUSCULAR | Status: AC
  Filled 2019-11-28: qty 1

## 2019-11-28 MED ORDER — SODIUM CHLORIDE 0.9 % IV SOLN
Freq: Once | INTRAVENOUS | Status: AC
  Filled 2019-11-28: qty 250

## 2019-11-28 MED ORDER — SODIUM CHLORIDE 0.9 % IV SOLN
100.0000 mg/m2 | Freq: Once | INTRAVENOUS | Status: AC
  Administered 2019-11-28: 190 mg via INTRAVENOUS
  Filled 2019-11-28: qty 9.5

## 2019-11-28 MED ORDER — DEXAMETHASONE SODIUM PHOSPHATE 10 MG/ML IJ SOLN
10.0000 mg | Freq: Once | INTRAMUSCULAR | Status: AC
  Administered 2019-11-28: 10 mg via INTRAVENOUS

## 2019-11-28 MED ORDER — SODIUM CHLORIDE 0.9 % IV SOLN: 10.0000 mg | Freq: Once | INTRAVENOUS | Status: DC

## 2019-11-28 NOTE — Telephone Encounter (Signed)
Scheduled per los. Gave printout in infusion

## 2019-11-28 NOTE — Patient Instructions (Signed)
[facility] Discharge Instructions for Patients Receiving Chemotherapy  Today you received the following chemotherapy agents Etoposide (VEPESID).  To help prevent nausea and vomiting after your treatment, we encourage you to take your nausea medication as prescribed.  If you develop nausea and vomiting that is not controlled by your nausea medication, call the clinic.   BELOW ARE SYMPTOMS THAT SHOULD BE REPORTED IMMEDIATELY:  *FEVER GREATER THAN 100.5 F  *CHILLS WITH OR WITHOUT FEVER  NAUSEA AND VOMITING THAT IS NOT CONTROLLED WITH YOUR NAUSEA MEDICATION  *UNUSUAL SHORTNESS OF BREATH  *UNUSUAL BRUISING OR BLEEDING  TENDERNESS IN MOUTH AND THROAT WITH OR WITHOUT PRESENCE OF ULCERS  *URINARY PROBLEMS  *BOWEL PROBLEMS  UNUSUAL RASH Items with * indicate a potential emergency and should be followed up as soon as possible.  Feel free to call the clinic should you have any questions or concerns. The clinic phone number is [phone].  Please show the [facility] at check-in to the Emergency Department and triage nurse.  Coronavirus (COVID-19) Are you at risk?  Are you at risk for the Coronavirus (COVID-19)?  To be considered HIGH RISK for Coronavirus (COVID-19), you have to meet the following criteria:  . Traveled to [place], [place], [place], [place] or [place]; or in the [place] to [place], [place], [place], or [place]; and have fever, cough, and shortness of breath within the last 2 weeks of travel OR . Been in close contact with a person diagnosed with COVID-19 within the last 2 weeks and have fever, cough, and shortness of breath . IF YOU DO NOT MEET THESE CRITERIA, YOU ARE CONSIDERED LOW RISK FOR COVID-19.  What to do if you are HIGH RISK for COVID-19?  Marland Kitchen If you are having a medical emergency, call 911. . Seek medical care right away. Before you go to a doctor's office, urgent care or emergency department, call ahead and tell them  about your recent travel, contact with someone diagnosed with COVID-19, and your symptoms. You should receive instructions from your physician's office regarding next steps of care.  . When you arrive at healthcare provider, tell the healthcare staff immediately you have returned from visiting [place], [place], [place], [place] or [place]; or traveled in the [place] to [place], [place], [place], or [place]; in the last two weeks or you have been in close contact with a person diagnosed with COVID-19 in the last 2 weeks.   . Tell the health care staff about your symptoms: fever, cough and shortness of breath. . After you have been seen by a medical provider, you will be either: o Tested for (COVID-19) and discharged home on quarantine except to seek medical care if symptoms worsen, and asked to  - Stay home and avoid contact with others until you get your results (4-5 days)  - Avoid travel on public transportation if possible (such as bus, train, or airplane) or o Sent to the Emergency Department by EMS for evaluation, COVID-19 testing, and possible admission depending on your condition and test results.  What to do if you are LOW RISK for COVID-19?  Reduce your risk of any infection by using the same precautions used for avoiding the common cold or flu:  Marland Kitchen Wash your hands often with soap and warm water for at least 20 seconds.  If soap and water are not readily available, use an alcohol-based hand sanitizer with at least 60% alcohol.  . If coughing or sneezing,  cover your mouth and nose by coughing or sneezing into the elbow areas of your shirt or coat, into a tissue or into your sleeve (not your hands). . Avoid shaking hands with others and consider head nods or verbal greetings only. . Avoid touching your eyes, nose, or mouth with unwashed hands.  . Avoid close contact with people who are sick. . Avoid places or events with large numbers of people in one location, like concerts or  sporting events. . Carefully consider travel plans you have or are making. . If you are planning any travel outside or inside the [place], visit the CDC's Travelers' Health webpage for the latest health notices. . If you have some symptoms but not all symptoms, continue to monitor at home and seek medical attention if your symptoms worsen. . If you are having a medical emergency, call 911.   [place] / e-Visit: [email or web address]         [facility]: [facility]: [phone]                   [facility]: [phone]

## 2019-11-29 ENCOUNTER — Inpatient Hospital Stay: Payer: Medicaid Other

## 2019-11-30 ENCOUNTER — Ambulatory Visit: Payer: Self-pay

## 2019-11-30 ENCOUNTER — Encounter: Payer: Self-pay | Attending: Physical Medicine and Rehabilitation | Admitting: Physical Medicine and Rehabilitation

## 2019-11-30 ENCOUNTER — Telehealth: Payer: Self-pay | Admitting: Emergency Medicine

## 2019-11-30 ENCOUNTER — Telehealth: Payer: Self-pay | Admitting: Internal Medicine

## 2019-11-30 NOTE — Progress Notes (Signed)
Patient did not show up to D3 etoposide treatment on 3/25. Per Dr. Julien Nordmann, okay to cancel this day in his treatment plan and keep injection appointment for Saturday.   Demetrius Charity, PharmD, BCPS, [facility] Pharmacist Pharmacy Phone: [phone] 11/30/2019

## 2019-11-30 NOTE — Telephone Encounter (Signed)
Per 3/26 sch msg, called and spoke with patient to confirmed 3/27 appt

## 2019-11-30 NOTE — Telephone Encounter (Signed)
Per MD Julien Nordmann ok to cancel day 3 of etoposide tx but keep Saturday injection of fulphilia (pt missed most recent day of tx, MD aware).  Pharmacist Maggie made aware and will fix tx plan.  High priority scheduling message sent to contact pt reminding him of injection appt on 3/27 at 1315 for fulphilia.

## 2019-12-01 ENCOUNTER — Inpatient Hospital Stay: Payer: Medicaid Other

## 2019-12-03 ENCOUNTER — Inpatient Hospital Stay: Payer: Medicaid Other

## 2019-12-05 ENCOUNTER — Telehealth: Payer: Self-pay | Admitting: General Practice

## 2019-12-05 NOTE — Telephone Encounter (Signed)
[facility] CSW Progress Notes  Call to check in w patient and caregiver - Almyra Free.  She is currently driving and will call back when stopped.  States "things are going pretty well right now."  Will be at [facility] this afternoon.  Edwyna Shell, LCSW Clinical Social Worker Phone:  [phone] Cell:  [phone]

## 2019-12-07 ENCOUNTER — Telehealth: Payer: Self-pay | Admitting: General Practice

## 2019-12-07 NOTE — Telephone Encounter (Signed)
[facility] CSW Progress Notes  Email from Nor[place] Haggard, [employer], stating that DSS has not received sign form DMA 5028 to allow determination of disability to proceed.  This is needed in order to process his Medicaid application.  Called and spoke w Rosanne Sack, significant other.  States they have lost their housing, are couch surfing, currently in [place].  Only working cell number is patient's cell - Julie's phone has been cut off.  They have no money to reinstate their cell phones.  She states tghey will turn in forms to DSS on Monday.  Also requests that chemo be rescheduled as "I know he has missed a lot of appointments."  Requests no early morning appointments as these are difficult for them to make.  Will communicate requests to navigator.  Edwyna Shell, LCSW Clinical Social Worker Phone:  [phone] Cell:  [phone]

## 2019-12-10 ENCOUNTER — Inpatient Hospital Stay: Payer: Medicaid Other | Attending: Internal Medicine

## 2019-12-10 DIAGNOSIS — C787 Secondary malignant neoplasm of liver and intrahepatic bile duct: Secondary | ICD-10-CM | POA: Insufficient documentation

## 2019-12-10 DIAGNOSIS — F319 Bipolar disorder, unspecified: Secondary | ICD-10-CM | POA: Insufficient documentation

## 2019-12-10 DIAGNOSIS — Z8619 Personal history of other infectious and parasitic diseases: Secondary | ICD-10-CM | POA: Insufficient documentation

## 2019-12-10 DIAGNOSIS — C3431 Malignant neoplasm of lower lobe, right bronchus or lung: Secondary | ICD-10-CM | POA: Insufficient documentation

## 2019-12-10 DIAGNOSIS — C7951 Secondary malignant neoplasm of bone: Secondary | ICD-10-CM | POA: Insufficient documentation

## 2019-12-10 DIAGNOSIS — Z79899 Other long term (current) drug therapy: Secondary | ICD-10-CM | POA: Insufficient documentation

## 2019-12-12 NOTE — Progress Notes (Signed)
Pharmacist Chemotherapy Monitoring - Follow Up Assessment    I verify that I have reviewed each item in the below checklist:  . Regimen for the patient is scheduled for the appropriate day and plan matches scheduled date. Marland Kitchen Appropriate non-routine labs are ordered dependent on drug ordered. . If applicable, additional medications reviewed and ordered per protocol based on lifetime cumulative doses and/or treatment regimen.   Plan for follow-up and/or issues identified: No . I-vent associated with next due treatment: No . MD and/or nursing notified: No  Medrith Veillon D 12/12/2019 2:26 PM

## 2019-12-13 NOTE — Progress Notes (Signed)
Pharmacist Chemotherapy Monitoring - Follow Up Assessment    I verify that I have reviewed each item in the below checklist:  . Regimen for the patient is scheduled for the appropriate day and plan matches scheduled date. Marland Kitchen Appropriate non-routine labs are ordered dependent on drug ordered. . If applicable, additional medications reviewed and ordered per protocol based on lifetime cumulative doses and/or treatment regimen.   Plan for follow-up and/or issues identified: No . I-vent associated with next due treatment: No . MD and/or nursing notified: No  Grigor Lipschutz D 12/13/2019 3:41 PM

## 2019-12-14 ENCOUNTER — Telehealth: Payer: Self-pay | Admitting: *Deleted

## 2019-12-14 ENCOUNTER — Ambulatory Visit (HOSPITAL_COMMUNITY)
Admission: RE | Admit: 2019-12-14 | Discharge: 2019-12-14 | Disposition: A | Discharge status: Home / Self Care | Payer: Medicaid Other | Source: Ambulatory Visit | Attending: Internal Medicine | Admitting: Internal Medicine

## 2019-12-14 ENCOUNTER — Other Ambulatory Visit: Payer: Self-pay | Admitting: Physician Assistant

## 2019-12-14 ENCOUNTER — Telehealth: Payer: Self-pay | Admitting: Physician Assistant

## 2019-12-14 ENCOUNTER — Other Ambulatory Visit: Payer: Self-pay

## 2019-12-14 DIAGNOSIS — C3491 Malignant neoplasm of unspecified part of right bronchus or lung: Secondary | ICD-10-CM | POA: Insufficient documentation

## 2019-12-14 DIAGNOSIS — G893 Neoplasm related pain (acute) (chronic): Secondary | ICD-10-CM

## 2019-12-14 DIAGNOSIS — C7951 Secondary malignant neoplasm of bone: Secondary | ICD-10-CM

## 2019-12-14 MED ORDER — OXYCODONE-ACETAMINOPHEN 10-325 MG PO TABS: 1.0000 | ORAL_TABLET | Freq: Four times a day (QID) | ORAL | 0 refills | Status: DC | PRN

## 2019-12-14 MED ORDER — SODIUM CHLORIDE (PF) 0.9 % IJ SOLN
INTRAMUSCULAR | Status: AC
  Filled 2019-12-14: qty 50

## 2019-12-14 MED ORDER — FENTANYL 75 MCG/HR TD PT72: 1.0000 | MEDICATED_PATCH | TRANSDERMAL | 0 refills | Status: DC

## 2019-12-14 MED ORDER — IOHEXOL 300 MG/ML  SOLN
100.0000 mL | Freq: Once | INTRAMUSCULAR | Status: AC | PRN
  Administered 2019-12-14: 100 mL via INTRAVENOUS

## 2019-12-14 NOTE — Telephone Encounter (Signed)
Received call from pt stating that he needs his pain med :oxycodone & patches refilled.  Notified Cassie Hellingoetter PA.

## 2019-12-14 NOTE — Progress Notes (Signed)

## 2019-12-14 NOTE — Telephone Encounter (Signed)
Received medication refill requesting refill of oxycodone and fentanyl patches. Looked patient up on PMP aware and confirmed his most recent prescription was sent on 3/8. Refill appropriate. Called the patient to confirm pharmacy.

## 2019-12-17 ENCOUNTER — Ambulatory Visit
Admission: RE | Admit: 2019-12-17 | Discharge: 2019-12-17 | Disposition: A | Discharge status: Home / Self Care | Payer: Self-pay | Source: Ambulatory Visit | Attending: Radiation Oncology | Admitting: Radiation Oncology

## 2019-12-18 ENCOUNTER — Inpatient Hospital Stay: Payer: Medicaid Other

## 2019-12-18 ENCOUNTER — Other Ambulatory Visit: Payer: Self-pay

## 2019-12-18 ENCOUNTER — Inpatient Hospital Stay (HOSPITAL_BASED_OUTPATIENT_CLINIC_OR_DEPARTMENT_OTHER): Payer: Medicaid Other | Admitting: Internal Medicine

## 2019-12-18 ENCOUNTER — Encounter: Payer: Self-pay | Admitting: Internal Medicine

## 2019-12-18 VITALS — BP 122/79 | HR 108 | Temp 98.7°F | Resp 20 | Wt 142.5 lb

## 2019-12-18 DIAGNOSIS — G893 Neoplasm related pain (acute) (chronic): Secondary | ICD-10-CM

## 2019-12-18 DIAGNOSIS — C3491 Malignant neoplasm of unspecified part of right bronchus or lung: Secondary | ICD-10-CM

## 2019-12-18 DIAGNOSIS — Z5111 Encounter for antineoplastic chemotherapy: Secondary | ICD-10-CM

## 2019-12-18 DIAGNOSIS — Z8619 Personal history of other infectious and parasitic diseases: Secondary | ICD-10-CM | POA: Diagnosis not present

## 2019-12-18 DIAGNOSIS — Z79899 Other long term (current) drug therapy: Secondary | ICD-10-CM | POA: Diagnosis not present

## 2019-12-18 DIAGNOSIS — C3431 Malignant neoplasm of lower lobe, right bronchus or lung: Secondary | ICD-10-CM | POA: Diagnosis not present

## 2019-12-18 DIAGNOSIS — C7951 Secondary malignant neoplasm of bone: Secondary | ICD-10-CM | POA: Diagnosis not present

## 2019-12-18 DIAGNOSIS — C787 Secondary malignant neoplasm of liver and intrahepatic bile duct: Secondary | ICD-10-CM | POA: Diagnosis not present

## 2019-12-18 DIAGNOSIS — F319 Bipolar disorder, unspecified: Secondary | ICD-10-CM | POA: Diagnosis not present

## 2019-12-18 DIAGNOSIS — Z5112 Encounter for antineoplastic immunotherapy: Secondary | ICD-10-CM

## 2019-12-18 LAB — CBC WITH DIFFERENTIAL (CANCER CENTER ONLY)
Abs Immature Granulocytes: 0.2 10*3/uL — ABNORMAL HIGH (ref 0.00–0.07)
Basophils Absolute: 0 10*3/uL (ref 0.0–0.1)
Basophils Relative: 0 %
Eosinophils Absolute: 0 10*3/uL (ref 0.0–0.5)
Eosinophils Relative: 0 %
HCT: 27.2 % — ABNORMAL LOW (ref 39.0–52.0)
Hemoglobin: 8.7 g/dL — ABNORMAL LOW (ref 13.0–17.0)
Immature Granulocytes: 2 %
Lymphocytes Relative: 4 %
Lymphs Abs: 0.4 10*3/uL — ABNORMAL LOW (ref 0.7–4.0)
MCH: 29.2 pg (ref 26.0–34.0)
MCHC: 32 g/dL (ref 30.0–36.0)
MCV: 91.3 fL (ref 80.0–100.0)
Monocytes Absolute: 0.9 10*3/uL (ref 0.1–1.0)
Monocytes Relative: 8 %
Neutro Abs: 9 10*3/uL — ABNORMAL HIGH (ref 1.7–7.7)
Neutrophils Relative %: 86 %
Platelet Count: 174 10*3/uL (ref 150–400)
RBC: 2.98 MIL/uL — ABNORMAL LOW (ref 4.22–5.81)
RDW: 19.4 % — ABNORMAL HIGH (ref 11.5–15.5)
WBC Count: 10.5 10*3/uL (ref 4.0–10.5)
nRBC: 0 % (ref 0.0–0.2)

## 2019-12-18 LAB — CMP (CANCER CENTER ONLY)
ALT: 15 U/L (ref 0–44)
AST: 20 U/L (ref 15–41)
Albumin: 2.8 g/dL — ABNORMAL LOW (ref 3.5–5.0)
Alkaline Phosphatase: 167 U/L — ABNORMAL HIGH (ref 38–126)
Anion gap: 11 (ref 5–15)
BUN: 10 mg/dL (ref 6–20)
CO2: 26 mmol/L (ref 22–32)
Calcium: 9.4 mg/dL (ref 8.9–10.3)
Chloride: 102 mmol/L (ref 98–111)
Creatinine: 0.68 mg/dL (ref 0.61–1.24)
GFR, Est AFR Am: >60 mL/min (ref >60)
GFR, Estimated: >60 mL/min (ref >60)
Glucose, Bld: 122 mg/dL — ABNORMAL HIGH (ref 70–99)
Potassium: 3.8 mmol/L (ref 3.5–5.1)
Sodium: 139 mmol/L (ref 135–145)
Total Bilirubin: 0.4 mg/dL (ref 0.3–1.2)
Total Protein: 8.1 g/dL (ref 6.5–8.1)

## 2019-12-18 LAB — TSH: TSH: 0.621 u[IU]/mL (ref 0.320–4.118)

## 2019-12-18 NOTE — Patient Instructions (Signed)
Steps to Quit Smoking Smoking tobacco is the leading cause of preventable death. It can affect almost every organ in the body. Smoking puts you and people around you at risk for many serious, long-lasting (chronic) diseases. Quitting smoking can be hard, but it is one of the best things that you can do for your health. It is never too late to quit. How do I get ready to quit? When you decide to quit smoking, make a plan to help you succeed. Before you quit:  Pick a date to quit. Set a date within the next 2 weeks to give you time to prepare.  Write down the reasons why you are quitting. Keep this list in places where you will see it often.  Tell your family, friends, and co-workers that you are quitting. Their support is important.  Talk with your doctor about the choices that may help you quit.  Find out if your health insurance will pay for these treatments.  Know the people, places, things, and activities that make you want to smoke (triggers). Avoid them. What first steps can I take to quit smoking?  Throw away all cigarettes at home, at work, and in your car.  Throw away the things that you use when you smoke, such as ashtrays and lighters.  Clean your car. Make sure to empty the ashtray.  Clean your home, including curtains and carpets. What can I do to help me quit smoking? Talk with your doctor about taking medicines and seeing a counselor at the same time. You are more likely to succeed when you do both.  If you are pregnant or breastfeeding, talk with your doctor about counseling or other ways to quit smoking. Do not take medicine to help you quit smoking unless your doctor tells you to do so. To quit smoking: Quit right away  Quit smoking totally, instead of slowly cutting back on how much you smoke over a period of time.  Go to counseling. You are more likely to quit if you go to counseling sessions regularly. Take medicine You may take medicines to help you quit. Some  medicines need a prescription, and some you can buy over-the-counter. Some medicines may contain a drug called nicotine to replace the nicotine in cigarettes. Medicines may:  Help you to stop having the desire to smoke (cravings).  Help to stop the problems that come when you stop smoking (withdrawal symptoms). Your doctor may ask you to use:  Nicotine patches, gum, or lozenges.  Nicotine inhalers or sprays.  Non-nicotine medicine that is taken by mouth. Find resources Find resources and other ways to help you quit smoking and remain smoke-free after you quit. These resources are most helpful when you use them often. They include:  Online chats with a counselor.  Phone quitlines.  Printed self-help materials.  Support groups or group counseling.  Text messaging programs.  Mobile phone apps. Use apps on your mobile phone or tablet that can help you stick to your quit plan. There are many free apps for mobile phones and tablets as well as websites. Examples include Quit Guide from the CDC and smokefree.gov  What things can I do to make it easier to quit?   Talk to your family and friends. Ask them to support and encourage you.  Call a phone quitline (1-800-QUIT-NOW), reach out to support groups, or work with a counselor.  Ask people who smoke to not smoke around you.  Avoid places that make you want to smoke,   such as: ? Bars. ? Parties. ? Smoke-break areas at work.  Spend time with people who do not smoke.  Lower the stress in your life. Stress can make you want to smoke. Try these things to help your stress: ? Getting regular exercise. ? Doing deep-breathing exercises. ? Doing yoga. ? Meditating. ? Doing a body scan. To do this, close your eyes, focus on one area of your body at a time from head to toe. Notice which parts of your body are tense. Try to relax the muscles in those areas. How will I feel when I quit smoking? Day 1 to 3 weeks Within the first 24 hours,  you may start to have some problems that come from quitting tobacco. These problems are very bad 2-3 days after you quit, but they do not often last for more than 2-3 weeks. You may get these symptoms:  Mood swings.  Feeling restless, nervous, angry, or annoyed.  Trouble concentrating.  Dizziness.  Strong desire for high-sugar foods and nicotine.  Weight gain.  Trouble pooping (constipation).  Feeling like you may vomit (nausea).  Coughing or a sore throat.  Changes in how the medicines that you take for other issues work in your body.  Depression.  Trouble sleeping (insomnia). Week 3 and afterward After the first 2-3 weeks of quitting, you may start to notice more positive results, such as:  Better sense of smell and taste.  Less coughing and sore throat.  Slower heart rate.  Lower blood pressure.  Clearer skin.  Better breathing.  Fewer sick days. Quitting smoking can be hard. Do not give up if you fail the first time. Some people need to try a few times before they succeed. Do your best to stick to your quit plan, and talk with your doctor if you have any questions or concerns. Summary  Smoking tobacco is the leading cause of preventable death. Quitting smoking can be hard, but it is one of the best things that you can do for your health.  When you decide to quit smoking, make a plan to help you succeed.  Quit smoking right away, not slowly over a period of time.  When you start quitting, seek help from your doctor, family, or friends. This information is not intended to replace advice given to you by your health care provider. Make sure you discuss any questions you have with your health care provider. Document Revised: 05/18/2019 Document Reviewed: 11/11/2018 Elsevier Patient Education  2020 Elsevier Inc.  

## 2019-12-18 NOTE — Progress Notes (Signed)
Called and faxed records to hospice of [facility].

## 2019-12-18 NOTE — Progress Notes (Signed)
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 The patient presented with a right lower lobe lung mass in addition to right hilar and mediastinal lymphadenopathy as well as extensive metastatic bone disease,metastatic disease to theleft jugular tubercle and jugular foramen,and lymphadenopathy in the abdomen. He was diagnosed in January 2021.   PRIOR THERAPY: Palliative radiotherapy to the painful bone metastatic lesions under the care of Dr. Sondra Come. Last treatment on 11/07/19  CURRENT THERAPY: palliative systemic chemotherapy with carboplatin for an AUC of 5 on day 1, etoposide 100 mg/m on days 1, 2, and 3, and Imfinzi 1500 mg on day 1 IV every 3 weeks with Neulasta support. First dose on 11/05/2019. Status post 1 cycle and partial treatment of cycle #2.   INTERVAL HISTORY: Nicolas Welch 49 y.o. male returns to the clinic today for follow-up visit accompanied by his girlfriend.  The patient continues to complain of pain all over his body.  He was seen by Dr. Maryjean Ka at the pain clinic and was given nerve block.  He continues to lose weight with lack of appetite.  He denied having any current chest pain, shortness of breath, cough or hemoptysis.  He has no nausea, vomiting, diarrhea or constipation.  He has no fever or chills.  He had repeat CT scan of the chest, abdomen pelvis performed recently and he is here for evaluation and discussion of his scan results.  MEDICAL HISTORY: Past Medical History:  Diagnosis Date   Anginal pain ([place])    Bipolar disorder ([place])    Cancer ([place])     " lung and metastatic bone cancer "   Depression    GERD (gastroesophageal reflux disease)    Hepatitis    HEP C   Lung mass     ALLERGIES:  is allergic to egg [eggs or  egg-derived products]; milk-related compounds; and wheat bran.  MEDICATIONS:  Current Outpatient Medications  Medication Sig Dispense Refill   albuterol (PROVENTIL) (2.5 MG/3ML) 0.083% nebulizer solution Take 3 mLs (2.5 mg total) by nebulization every 6 (six) hours as needed for wheezing or shortness of breath. 75 mL 12   cyclobenzaprine (FLEXERIL) 10 MG tablet Take 1 tablet (10 mg total) by mouth 3 (three) times daily as needed for muscle spasms. 90 tablet 0   fentaNYL (DURAGESIC) 75 MCG/HR Place 1 patch onto the skin every 3 (three) days. 5 patch 0   Fluticasone-Umeclidin-Vilant (TRELEGY ELLIPTA) 100-62.5-25 MCG/INH AEPB Inhale 1 puff into the lungs daily. 28 each 0   ibuprofen (ADVIL) 200 MG tablet Take 800 mg by mouth every 6 (six) hours as needed for moderate pain.      oxyCODONE-acetaminophen (PERCOCET) 10-325 MG tablet Take 1 tablet by mouth every 6 (six) hours as needed for pain. 30 tablet 0   prochlorperazine (COMPAZINE) 10 MG tablet Take 1 tablet (10 mg total) by mouth every 6 (six) hours as needed for nausea or vomiting. (Patient not taking: Reported on 12/18/2019) 30 tablet 2   No current facility-administered medications for this visit.    SURGICAL HISTORY:  Past Surgical History:  Procedure Laterality Date   APPENDECTOMY     BRONCHIAL BIOPSY  10/12/2019   Procedure: BRONCHIAL BIOPSIES;  Surgeon: [name], DO;  Location: [place];  Service: Thoracic;;   BRONCHIAL  NEEDLE ASPIRATION BIOPSY  10/12/2019   Procedure: BRONCHIAL NEEDLE ASPIRATION BIOPSIES;  Surgeon: [name], DO;  Location: [place];  Service: Thoracic;;   BRONCHIAL WASHINGS  10/12/2019   Procedure: BRONCHIAL WASHINGS;  Surgeon: [name], DO;  Location: [facility];  Service: Thoracic;;   TONSILLECTOMY AND ADENOIDECTOMY     VIDEO BRONCHOSCOPY WITH ENDOBRONCHIAL ULTRASOUND N/A 10/12/2019   Procedure: VIDEO BRONCHOSCOPY WITH ENDOBRONCHIAL ULTRASOUND;  Surgeon: [name], DO;   Location: [facility];  Service: Thoracic;  Laterality: N/A;   WISDOM TOOTH EXTRACTION      REVIEW OF SYSTEMS:  Constitutional: positive for anorexia, fatigue and weight loss Eyes: negative Ears, nose, mouth, throat, and face: negative Respiratory: negative Cardiovascular: negative Gastrointestinal: negative Genitourinary:negative Integument/breast: negative Hematologic/lymphatic: negative Musculoskeletal:positive for bone pain Neurological: negative Behavioral/Psych: negative Endocrine: negative Allergic/Immunologic: negative   PHYSICAL EXAMINATION: General appearance: alert, cooperative, fatigued and no distress Head: Normocephalic, without obvious abnormality, atraumatic Neck: no adenopathy, no JVD, supple, symmetrical, trachea midline and thyroid not enlarged, symmetric, no tenderness/mass/nodules Lymph nodes: Cervical, supraclavicular, and axillary nodes normal. Resp: clear to auscultation bilaterally Back: symmetric, no curvature. ROM normal. No CVA tenderness. Cardio: regular rate and rhythm, S1, S2 normal, no murmur, click, rub or gallop GI: soft, non-tender; bowel sounds normal; no masses,  no organomegaly Extremities: extremities normal, atraumatic, no cyanosis or edema Neurologic: Alert and oriented X 3, normal strength and tone. Normal symmetric reflexes. Normal coordination and gait  ECOG PERFORMANCE STATUS: 1 - Symptomatic but completely ambulatory  Blood pressure 122/79, pulse (!) 108, temperature 98.7 F (37.1 C), temperature source Oral, resp. rate 20, weight 142 lb 8 oz (64.6 kg), SpO2 99 %.  LABORATORY DATA: Lab Results  Component Value Date   WBC 10.5 12/18/2019   HGB 8.7 (L) 12/18/2019   HCT 27.2 (L) 12/18/2019   MCV 91.3 12/18/2019   PLT 174 12/18/2019      Chemistry      Component Value Date/Time   NA 137 11/26/2019 0908   K 3.6 11/26/2019 0908   CL 102 11/26/2019 0908   CO2 25 11/26/2019 0908   BUN 4 (L) 11/26/2019 0908   CREATININE 0.64  11/26/2019 0908      Component Value Date/Time   CALCIUM 9.6 11/26/2019 0908   ALKPHOS 197 (H) 11/26/2019 0908   AST 32 11/26/2019 0908   ALT 24 11/26/2019 0908   BILITOT 0.5 11/26/2019 0908       RADIOGRAPHIC STUDIES: CT Chest W Contrast  Result Date: 12/15/2019 CLINICAL DATA:  Extensive stage small cell lung cancer, status post 2 cycles of chemotherapy, assess treatment response EXAM: CT CHEST, ABDOMEN, AND PELVIS WITH CONTRAST TECHNIQUE: Multidetector CT imaging of the chest, abdomen and pelvis was performed following the standard protocol during bolus administration of intravenous contrast. CONTRAST:  118mL OMNIPAQUE IOHEXOL 300 MG/ML  SOLN COMPARISON:  PET-CT dated 11/02/2019.  CT chest dated 10/03/2019. FINDINGS: CT CHEST FINDINGS Cardiovascular: The heart is normal in size. No pericardial effusion. No evidence of thoracic aortic aneurysm. Atherosclerotic calcifications of the aortic arch. Mild atherosclerosis of the LAD. Mediastinum/Nodes: Thoracic lymphadenopathy is similar when compared to prior CT chest (and difficult to measure on PET), including: --14 mm short axis high right paratracheal node (series 2/image 24), previously 16 mm --16 mm short axis right hilar node (series 2/image 33), previously 15 mm --Dominant 18 mm subcarinal node (series 2/image 34), previously 19 mm Visualized thyroid is unremarkable. Lungs/Pleura: 2.1 x 4.4 cm central right lower lobe mass (series  2/image 40), previously 3.3 x 3.8 cm on CT (and difficult to measure on PET). Associated segmental postobstructive anterior left lower lobe opacity. Scattered small right lung nodules measuring up to 3-4 mm, unchanged. No pleural effusion or pneumothorax. Musculoskeletal: Progressive bilateral chest wall/rib metastases when compared to recent PET, including: --2.1 x 3.1 cm mass along the right anterior 3rd rib (series 2/image 24), previously 1.7 x 2.1 cm --2.8 x 5.3 cm mass along the left posterior 8th rib (series 2/image  34), previously 2.7 x 4.6 cm Additional widespread multifocal lytic and osseous metastases throughout the visualized appendicular skeleton, progressive when compared to recent CT. Moderate pathologic compression fracture at T5 (sagittal image 102), progressive. Mild superior endplate loss of height at T8, new. CT ABDOMEN PELVIS FINDINGS Hepatobiliary: Dominant 3.2 x 4.6 cm mass inferiorly in segment 6 of the liver (series 2/image 74), progressive from prior CT, difficult to discretely measure on prior PET. Additional scattered subcentimeter hepatic lesions in both lobes which are new, suggesting multifocal metastases, approximately 8-10 in number. Suspected layering noncalcified gallstone (series 2/image 73). No associated inflammatory changes. No intrahepatic or extrahepatic ductal dilatation. Pancreas: Within normal limits. Spleen: 8 mm lesion in the medial spleen (series 2/image 58). This is slightly more conspicuous/increased when compared recent CT but is non FDG avid on recent PET, and remains indeterminate. Adrenals/Urinary Tract: Adrenal glands are within normal limits. Mildly heterogeneous perfusion of the bilateral kidneys, nonspecific. No hydronephrosis. Bladder is within normal limits. Stomach/Bowel: Stomach is within normal limits. No evidence of bowel obstruction. Appendix is not discretely visualized. No colonic wall thickening or mass is seen. Vascular/Lymphatic: No evidence of abdominal aortic aneurysm. Atherosclerotic calcifications of the abdominal aorta and branch vessels. Small upper abdominal lymph nodes at the porta hepatis, non FDG avid on PET. No suspicious abdominopelvic lymphadenopathy. Reproductive: Prostate is unremarkable. Other: No abdominopelvic ascites. Musculoskeletal: Multifocal osseous metastases throughout the visualized axial and appendicular skeleton. A dominant soft tissue mass in the right posterior ischium measures 3.0 x 4.2 cm (series 2/image 125), previously 2.7 x 3.4 cm  on recent PET, progressive. Dominant lytic lesions involving the posterior aspect of L1, L3, and L5. Mild loss of height at L3 and L5, new. Dominant lytic lesion involving the anterior aspect of S1, without loss of height. IMPRESSION: Central right lower lobe mass with postobstructive opacity, corresponding to the patient's known primary bronchogenic neoplasm, similar. Thoracic lymphadenopathy, grossly unchanged. Progressive hepatic metastases, with dominant index lesion as above. Widespread osseous metastases throughout the visualized axial and appendicular skeleton, progressive. Associated pathologic fracture at T5, T8, L3, and L5. Additional ancillary findings as above. Electronically Signed   By: Julian Hy M.D.   On: 12/15/2019 21:10   MR BRAIN W WO CONTRAST  Result Date: 11/25/2019 CLINICAL DATA:  Brain mass. Brain/CNS neoplasm, surveillance. Additional history obtained from [place] NUMBERHistory of metastatic lung cancer. EXAM: MRI HEAD WITHOUT AND WITH CONTRAST TECHNIQUE: Multiplanar, multiecho pulse sequences of the brain and surrounding structures were obtained without and with intravenous contrast. CONTRAST:  40mL GADAVIST GADOBUTROL 1 MMOL/ML IV SOLN COMPARISON:  Brain MRI 10/15/2019 FINDINGS: Brain: Again demonstrated is a masslike region of swelling at the left cerebellopontine angle which demonstrates only subtle T2/FLAIR hyperintensity. Unchanged, the mass measures 2 cm (series 7, image 11). There is no associated abnormal enhancement. An avidly enhancing mass centered at the left jugular foramen is stable to minimally increased in size as compared to 10/15/2019, now 2.7 cm (previously 2.6 cm, remeasured on prior) (series 10,  image 16) (series 11, image 14) No evidence of acute infarct. No midline shift or extra-axial fluid collection. A few nonspecific punctate chronic microhemorrhages are again demonstrated within the supratentorial and infratentorial brain. Scattered  T2/FLAIR hyperintensity within the cerebral white matter is nonspecific, but consistent with chronic small vessel ischemic disease. Cerebral volume is normal for age. Vascular: Flow voids maintained within the proximal large arterial vessels. As before, there is diminished flow within the upper left internal jugular vein attributed to metastatic disease at the level of the jugular foramen. Skull and upper cervical spine: 3.0 cm avidly enhancing mass at the left jugular foramen as described. Sinuses/Orbits: Visualized orbits demonstrate no acute abnormality. No significant paranasal sinus disease or mastoid effusion. Other: Incidentally noted Tornwaldt cyst. IMPRESSION: 1. An avidly enhancing metastasis centered at the left jugular foramen is stable to minimally increased in size as compared to MRI 10/15/2019, now 2.7 cm (previously 2.6 cm). 2. Stable nonenhancing 2 cm intra-axial mass at the left cerebellopontine angle, which is indeterminate in etiology. The possibility of an incidental infiltrating glioma is again raised. 3. No new intracranial abnormality. Electronically Signed   By: Kellie Simmering DO   On: 11/25/2019 17:11   CT Abdomen Pelvis W Contrast  Result Date: 12/15/2019 CLINICAL DATA:  Extensive stage small cell lung cancer, status post 2 cycles of chemotherapy, assess treatment response EXAM: CT CHEST, ABDOMEN, AND PELVIS WITH CONTRAST TECHNIQUE: Multidetector CT imaging of the chest, abdomen and pelvis was performed following the standard protocol during bolus administration of intravenous contrast. CONTRAST:  191mL OMNIPAQUE IOHEXOL 300 MG/ML  SOLN COMPARISON:  PET-CT dated 11/02/2019.  CT chest dated 10/03/2019. FINDINGS: CT CHEST FINDINGS Cardiovascular: The heart is normal in size. No pericardial effusion. No evidence of thoracic aortic aneurysm. Atherosclerotic calcifications of the aortic arch. Mild atherosclerosis of the LAD. Mediastinum/Nodes: Thoracic lymphadenopathy is similar when compared  to prior CT chest (and difficult to measure on PET), including: --14 mm short axis high right paratracheal node (series 2/image 24), previously 16 mm --16 mm short axis right hilar node (series 2/image 33), previously 15 mm --Dominant 18 mm subcarinal node (series 2/image 34), previously 19 mm Visualized thyroid is unremarkable. Lungs/Pleura: 2.1 x 4.4 cm central right lower lobe mass (series 2/image 40), previously 3.3 x 3.8 cm on CT (and difficult to measure on PET). Associated segmental postobstructive anterior left lower lobe opacity. Scattered small right lung nodules measuring up to 3-4 mm, unchanged. No pleural effusion or pneumothorax. Musculoskeletal: Progressive bilateral chest wall/rib metastases when compared to recent PET, including: --2.1 x 3.1 cm mass along the right anterior 3rd rib (series 2/image 24), previously 1.7 x 2.1 cm --2.8 x 5.3 cm mass along the left posterior 8th rib (series 2/image 34), previously 2.7 x 4.6 cm Additional widespread multifocal lytic and osseous metastases throughout the visualized appendicular skeleton, progressive when compared to recent CT. Moderate pathologic compression fracture at T5 (sagittal image 102), progressive. Mild superior endplate loss of height at T8, new. CT ABDOMEN PELVIS FINDINGS Hepatobiliary: Dominant 3.2 x 4.6 cm mass inferiorly in segment 6 of the liver (series 2/image 74), progressive from prior CT, difficult to discretely measure on prior PET. Additional scattered subcentimeter hepatic lesions in both lobes which are new, suggesting multifocal metastases, approximately 8-10 in number. Suspected layering noncalcified gallstone (series 2/image 73). No associated inflammatory changes. No intrahepatic or extrahepatic ductal dilatation. Pancreas: Within normal limits. Spleen: 8 mm lesion in the medial spleen (series 2/image 58). This is slightly more conspicuous/increased when  compared recent CT but is non FDG avid on recent PET, and remains  indeterminate. Adrenals/Urinary Tract: Adrenal glands are within normal limits. Mildly heterogeneous perfusion of the bilateral kidneys, nonspecific. No hydronephrosis. Bladder is within normal limits. Stomach/Bowel: Stomach is within normal limits. No evidence of bowel obstruction. Appendix is not discretely visualized. No colonic wall thickening or mass is seen. Vascular/Lymphatic: No evidence of abdominal aortic aneurysm. Atherosclerotic calcifications of the abdominal aorta and branch vessels. Small upper abdominal lymph nodes at the porta hepatis, non FDG avid on PET. No suspicious abdominopelvic lymphadenopathy. Reproductive: Prostate is unremarkable. Other: No abdominopelvic ascites. Musculoskeletal: Multifocal osseous metastases throughout the visualized axial and appendicular skeleton. A dominant soft tissue mass in the right posterior ischium measures 3.0 x 4.2 cm (series 2/image 125), previously 2.7 x 3.4 cm on recent PET, progressive. Dominant lytic lesions involving the posterior aspect of L1, L3, and L5. Mild loss of height at L3 and L5, new. Dominant lytic lesion involving the anterior aspect of S1, without loss of height. IMPRESSION: Central right lower lobe mass with postobstructive opacity, corresponding to the patient's known primary bronchogenic neoplasm, similar. Thoracic lymphadenopathy, grossly unchanged. Progressive hepatic metastases, with dominant index lesion as above. Widespread osseous metastases throughout the visualized axial and appendicular skeleton, progressive. Associated pathologic fracture at T5, T8, L3, and L5. Additional ancillary findings as above. Electronically Signed   By: Julian Hy M.D.   On: 12/15/2019 21:10    ASSESSMENT AND PLAN: This is a very pleasant 49 years old white male recently diagnosed with extensive stage small cell lung cancer and currently undergoing systemic chemotherapy with carboplatin, etoposide and Imfinzi status post 1 cycle and only day 1  of cycle #2.  He did not show up for day 2 and 3 of his treatment of cycle #2.Marland Kitchen   He has a rough time with this treatment and he did not show up for part of the treatment of cycle #2 because he was homeless at that time. The patient had repeat CT scan of the chest, abdomen pelvis performed recently.  I personally and independently reviewed the scan images and discussed the results with the patient and his girlfriend. Unfortunately his scan showed evidence for disease progression especially in the liver and bone. I discussed with the patient and his girlfriend his treatment options including palliative care and hospice referral.  I do not think the patient will be able to tolerate any further systemic chemotherapy and unlikely to work if the first-line did not show any significant response. The patient agreed to the option of palliative care and hospice referral. For pain management, he was seen by Dr. Lovenia Shuck and he was treated with nerve block.  He will continue the pain management under Dr. Maryjean Ka and the hospice service. I will see him on as-needed basis at this point. He was advised to call if he has any concerning symptoms.  The patient voices understanding of current disease status and treatment options and is in agreement with the current care plan.  All questions were answered. The patient knows to call the clinic with any problems, questions or concerns. We can certainly see the patient much sooner if necessary.  Disclaimer: This note was dictated with voice recognition software. Similar sounding words can inadvertently be transcribed and may not be corrected upon review.

## 2019-12-19 ENCOUNTER — Inpatient Hospital Stay: Payer: Medicaid Other

## 2019-12-19 ENCOUNTER — Inpatient Hospital Stay: Payer: Medicaid Other | Admitting: Nutrition

## 2019-12-20 ENCOUNTER — Ambulatory Visit: Payer: Self-pay

## 2019-12-22 ENCOUNTER — Ambulatory Visit: Payer: Self-pay

## 2019-12-24 ENCOUNTER — Ambulatory Visit: Payer: Self-pay | Admitting: Internal Medicine

## 2019-12-24 ENCOUNTER — Other Ambulatory Visit: Payer: Self-pay

## 2019-12-26 ENCOUNTER — Encounter: Payer: Self-pay | Admitting: Pulmonary Disease

## 2019-12-26 ENCOUNTER — Ambulatory Visit (INDEPENDENT_AMBULATORY_CARE_PROVIDER_SITE_OTHER): Payer: Self-pay | Admitting: Pulmonary Disease

## 2019-12-26 ENCOUNTER — Other Ambulatory Visit: Payer: Self-pay

## 2019-12-26 DIAGNOSIS — R410 Disorientation, unspecified: Secondary | ICD-10-CM

## 2019-12-26 DIAGNOSIS — C3491 Malignant neoplasm of unspecified part of right bronchus or lung: Secondary | ICD-10-CM

## 2019-12-26 NOTE — Progress Notes (Signed)
PCCM:  I called and spoke with patient's fianc Nicolas Welch.  It appears as if Nicolas Welch is not doing well at home on hospice care at this time.  Hospice nurse is there.  Patient is having difficulty breathing and in and out of a confusional state.  They are working to help make him more comfortable.  They suspect that he may pass soon.  [name], DO [facility] 12/26/2019 10:29 AM

## 2019-12-31 ENCOUNTER — Other Ambulatory Visit: Payer: Self-pay

## 2020-01-08 ENCOUNTER — Other Ambulatory Visit: Payer: Self-pay

## 2020-01-08 ENCOUNTER — Ambulatory Visit: Payer: Self-pay

## 2020-01-08 ENCOUNTER — Ambulatory Visit: Payer: Self-pay | Admitting: Internal Medicine

## 2020-01-09 ENCOUNTER — Ambulatory Visit: Payer: Self-pay

## 2020-01-10 ENCOUNTER — Ambulatory Visit: Payer: Self-pay

## 2020-01-12 ENCOUNTER — Ambulatory Visit: Payer: Self-pay

## 2020-02-01 ENCOUNTER — Other Ambulatory Visit: Payer: Self-pay

## 2020-02-01 ENCOUNTER — Encounter (HOSPITAL_COMMUNITY): Payer: Self-pay | Admitting: Emergency Medicine

## 2020-02-01 ENCOUNTER — Inpatient Hospital Stay (HOSPITAL_COMMUNITY)
Admission: EM | Admit: 2020-02-01 | Discharge: 2020-02-03 | DRG: 951 | Discharge status: Against Medical Advice | Payer: Medicaid Other | Attending: Internal Medicine | Admitting: Internal Medicine

## 2020-02-01 ENCOUNTER — Emergency Department (HOSPITAL_COMMUNITY): Payer: Medicaid Other

## 2020-02-01 DIAGNOSIS — R64 Cachexia: Secondary | ICD-10-CM | POA: Diagnosis present

## 2020-02-01 DIAGNOSIS — Z515 Encounter for palliative care: Secondary | ICD-10-CM | POA: Diagnosis present

## 2020-02-01 DIAGNOSIS — Z91012 Allergy to eggs: Secondary | ICD-10-CM

## 2020-02-01 DIAGNOSIS — C7931 Secondary malignant neoplasm of brain: Secondary | ICD-10-CM | POA: Diagnosis present

## 2020-02-01 DIAGNOSIS — Z91018 Allergy to other foods: Secondary | ICD-10-CM | POA: Diagnosis not present

## 2020-02-01 DIAGNOSIS — Z5329 Procedure and treatment not carried out because of patient's decision for other reasons: Secondary | ICD-10-CM | POA: Diagnosis present

## 2020-02-01 DIAGNOSIS — Z66 Do not resuscitate: Secondary | ICD-10-CM | POA: Diagnosis present

## 2020-02-01 DIAGNOSIS — Z20822 Contact with and (suspected) exposure to covid-19: Secondary | ICD-10-CM | POA: Diagnosis present

## 2020-02-01 DIAGNOSIS — G893 Neoplasm related pain (acute) (chronic): Secondary | ICD-10-CM | POA: Diagnosis present

## 2020-02-01 DIAGNOSIS — F419 Anxiety disorder, unspecified: Secondary | ICD-10-CM | POA: Diagnosis present

## 2020-02-01 DIAGNOSIS — Z681 Body mass index (BMI) 19 or less, adult: Secondary | ICD-10-CM

## 2020-02-01 DIAGNOSIS — Y92018 Other place in single-family (private) house as the place of occurrence of the external cause: Secondary | ICD-10-CM

## 2020-02-01 DIAGNOSIS — Z9221 Personal history of antineoplastic chemotherapy: Secondary | ICD-10-CM | POA: Diagnosis not present

## 2020-02-01 DIAGNOSIS — F17210 Nicotine dependence, cigarettes, uncomplicated: Secondary | ICD-10-CM | POA: Diagnosis present

## 2020-02-01 DIAGNOSIS — S2249XA Multiple fractures of ribs, unspecified side, initial encounter for closed fracture: Secondary | ICD-10-CM

## 2020-02-01 DIAGNOSIS — D649 Anemia, unspecified: Secondary | ICD-10-CM | POA: Diagnosis present

## 2020-02-01 DIAGNOSIS — S2243XA Multiple fractures of ribs, bilateral, initial encounter for closed fracture: Secondary | ICD-10-CM | POA: Diagnosis present

## 2020-02-01 DIAGNOSIS — C3491 Malignant neoplasm of unspecified part of right bronchus or lung: Secondary | ICD-10-CM | POA: Diagnosis present

## 2020-02-01 DIAGNOSIS — F319 Bipolar disorder, unspecified: Secondary | ICD-10-CM | POA: Diagnosis present

## 2020-02-01 DIAGNOSIS — G9341 Metabolic encephalopathy: Secondary | ICD-10-CM | POA: Diagnosis present

## 2020-02-01 DIAGNOSIS — Z7951 Long term (current) use of inhaled steroids: Secondary | ICD-10-CM | POA: Diagnosis not present

## 2020-02-01 DIAGNOSIS — E876 Hypokalemia: Secondary | ICD-10-CM | POA: Diagnosis present

## 2020-02-01 DIAGNOSIS — K219 Gastro-esophageal reflux disease without esophagitis: Secondary | ICD-10-CM | POA: Diagnosis present

## 2020-02-01 DIAGNOSIS — C7951 Secondary malignant neoplasm of bone: Secondary | ICD-10-CM | POA: Diagnosis present

## 2020-02-01 DIAGNOSIS — Z923 Personal history of irradiation: Secondary | ICD-10-CM

## 2020-02-01 DIAGNOSIS — R6510 Systemic inflammatory response syndrome (SIRS) of non-infectious origin without acute organ dysfunction: Secondary | ICD-10-CM | POA: Diagnosis present

## 2020-02-01 DIAGNOSIS — W108XXA Fall (on) (from) other stairs and steps, initial encounter: Secondary | ICD-10-CM | POA: Diagnosis present

## 2020-02-01 DIAGNOSIS — W19XXXA Unspecified fall, initial encounter: Secondary | ICD-10-CM | POA: Diagnosis not present

## 2020-02-01 DIAGNOSIS — M549 Dorsalgia, unspecified: Secondary | ICD-10-CM | POA: Diagnosis not present

## 2020-02-01 DIAGNOSIS — Z91011 Allergy to milk products: Secondary | ICD-10-CM

## 2020-02-01 LAB — COMPREHENSIVE METABOLIC PANEL
ALT: 19 U/L (ref 0–44)
AST: 35 U/L (ref 15–41)
Albumin: 2.4 g/dL — ABNORMAL LOW (ref 3.5–5.0)
Alkaline Phosphatase: 137 U/L — ABNORMAL HIGH (ref 38–126)
Anion gap: 10 (ref 5–15)
BUN: 24 mg/dL — ABNORMAL HIGH (ref 6–20)
CO2: 29 mmol/L (ref 22–32)
Calcium: 13.9 mg/dL (ref 8.9–10.3)
Chloride: 95 mmol/L — ABNORMAL LOW (ref 98–111)
Creatinine, Ser: 0.98 mg/dL (ref 0.61–1.24)
GFR calc Af Amer: >60 mL/min (ref >60)
GFR calc non Af Amer: >60 mL/min (ref >60)
Glucose, Bld: 93 mg/dL (ref 70–99)
Potassium: 2.7 mmol/L — CL (ref 3.5–5.1)
Sodium: 134 mmol/L — ABNORMAL LOW (ref 135–145)
Total Bilirubin: 0.8 mg/dL (ref 0.3–1.2)
Total Protein: 7 g/dL (ref 6.5–8.1)

## 2020-02-01 LAB — SARS CORONAVIRUS 2 BY RT PCR (HOSPITAL ORDER, PERFORMED IN ~~LOC~~ HOSPITAL LAB): SARS Coronavirus 2: NEGATIVE

## 2020-02-01 LAB — CBC WITH DIFFERENTIAL/PLATELET
Abs Immature Granulocytes: 0.09 10*3/uL — ABNORMAL HIGH (ref 0.00–0.07)
Basophils Absolute: 0 10*3/uL (ref 0.0–0.1)
Basophils Relative: 0 %
Eosinophils Absolute: 0 10*3/uL (ref 0.0–0.5)
Eosinophils Relative: 0 %
HCT: 26.5 % — ABNORMAL LOW (ref 39.0–52.0)
Hemoglobin: 8.5 g/dL — ABNORMAL LOW (ref 13.0–17.0)
Immature Granulocytes: 1 %
Lymphocytes Relative: 2 %
Lymphs Abs: 0.3 10*3/uL — ABNORMAL LOW (ref 0.7–4.0)
MCH: 29.3 pg (ref 26.0–34.0)
MCHC: 32.1 g/dL (ref 30.0–36.0)
MCV: 91.4 fL (ref 80.0–100.0)
Monocytes Absolute: 0.9 10*3/uL (ref 0.1–1.0)
Monocytes Relative: 6 %
Neutro Abs: 12.1 10*3/uL — ABNORMAL HIGH (ref 1.7–7.7)
Neutrophils Relative %: 91 %
Platelets: 180 10*3/uL (ref 150–400)
RBC: 2.9 MIL/uL — ABNORMAL LOW (ref 4.22–5.81)
RDW: 14 % (ref 11.5–15.5)
WBC: 13.4 10*3/uL — ABNORMAL HIGH (ref 4.0–10.5)
nRBC: 0 % (ref 0.0–0.2)

## 2020-02-01 LAB — MAGNESIUM: Magnesium: 1.5 mg/dL — ABNORMAL LOW (ref 1.7–2.4)

## 2020-02-01 MED ORDER — FENTANYL 100 MCG/HR TD PT72
1.0000 | MEDICATED_PATCH | TRANSDERMAL | Status: DC
  Administered 2020-02-01: 1 via TRANSDERMAL
  Filled 2020-02-01: qty 1

## 2020-02-01 MED ORDER — PROCHLORPERAZINE MALEATE 10 MG PO TABS: 10.0000 mg | ORAL_TABLET | Freq: Four times a day (QID) | ORAL | Status: DC | PRN

## 2020-02-01 MED ORDER — POTASSIUM CHLORIDE CRYS ER 20 MEQ PO TBCR: 80.0000 meq | EXTENDED_RELEASE_TABLET | Freq: Once | ORAL | Status: DC

## 2020-02-01 MED ORDER — MAGNESIUM SULFATE 2 GM/50ML IV SOLN: 2.0000 g | Freq: Once | INTRAVENOUS | Status: DC

## 2020-02-01 MED ORDER — OXYCODONE-ACETAMINOPHEN 5-325 MG PO TABS
2.0000 | ORAL_TABLET | Freq: Once | ORAL | Status: AC
  Administered 2020-02-01: 2 via ORAL
  Filled 2020-02-01: qty 2

## 2020-02-01 MED ORDER — POTASSIUM CHLORIDE CRYS ER 20 MEQ PO TBCR
40.0000 meq | EXTENDED_RELEASE_TABLET | Freq: Once | ORAL | Status: AC
  Administered 2020-02-01: 40 meq via ORAL
  Filled 2020-02-01: qty 2

## 2020-02-01 MED ORDER — ENOXAPARIN SODIUM 40 MG/0.4ML ~~LOC~~ SOLN
40.0000 mg | SUBCUTANEOUS | Status: DC
  Administered 2020-02-01 – 2020-02-02 (×2): 40 mg via SUBCUTANEOUS
  Filled 2020-02-01 (×2): qty 0.4

## 2020-02-01 MED ORDER — SODIUM CHLORIDE 0.9 % IV SOLN: INTRAVENOUS | Status: DC

## 2020-02-01 MED ORDER — HYDROMORPHONE HCL 1 MG/ML IJ SOLN
1.0000 mg | Freq: Once | INTRAMUSCULAR | Status: AC
  Administered 2020-02-01: 1 mg via INTRAVENOUS
  Filled 2020-02-01: qty 1

## 2020-02-01 MED ORDER — HYDROMORPHONE HCL 1 MG/ML IJ SOLN
0.5000 mg | Freq: Once | INTRAMUSCULAR | Status: AC
  Administered 2020-02-01: 0.5 mg via INTRAVENOUS
  Filled 2020-02-01: qty 1

## 2020-02-01 MED ORDER — FENTANYL CITRATE (PF) 100 MCG/2ML IJ SOLN
50.0000 ug | INTRAMUSCULAR | Status: DC | PRN
  Administered 2020-02-01 – 2020-02-02 (×8): 100 ug via INTRAVENOUS
  Filled 2020-02-01 (×8): qty 2

## 2020-02-01 MED ORDER — POTASSIUM CHLORIDE 10 MEQ/100ML IV SOLN
10.0000 meq | INTRAVENOUS | Status: AC
  Administered 2020-02-01 (×2): 10 meq via INTRAVENOUS
  Filled 2020-02-01 (×2): qty 100

## 2020-02-01 MED ORDER — SODIUM CHLORIDE 0.9 % IV BOLUS
500.0000 mL | Freq: Once | INTRAVENOUS | Status: AC
  Administered 2020-02-01: 500 mL via INTRAVENOUS

## 2020-02-01 MED ORDER — FENTANYL 100 MCG/HR TD PT72: 1.0000 | MEDICATED_PATCH | TRANSDERMAL | Status: DC

## 2020-02-01 NOTE — ED Provider Notes (Signed)
[facility] DEPT Provider Note   CSN: [id number] Arrival date & time: 02/01/20  1326     History Chief Complaint  Patient presents with  . Fall  . Generalized Body Aches    Nicolas Welch is a 49 y.o. male with a past medical history of IV drug abuse, metastatic non-small cell lung carcinoma who has discontinued all palliative chemotherapy who presents emergency department after fall.  Patient states that over the past 4 days he has become so weak he can barely walk.  He was trying to ambulate with a cane and got dizzy, fell backward, fell off of his deck, hit his head against a light pole and landed on the concrete yesterday.  He states "I thought I was going to die."  No one was home with him so it took about an hour to crawl back into the house.  He did not lose consciousness.  He complains of pain "everywhere."  He states that he is too weak and tired and he would like a hospice consult.  States that he lives at home with his girlfriend who is having a lot of difficulty caring for him.  HPI     Past Medical History:  Diagnosis Date  . Anginal pain ([place])   . Bipolar disorder ([place])   . Cancer ([place])     " lung and metastatic bone cancer "  . Depression   . GERD (gastroesophageal reflux disease)   . Hepatitis    HEP C  . Lung mass     Patient Active Problem List   Diagnosis Date Noted  . Weight loss 11/12/2019  . Brain mass 10/29/2019  . 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 Encounter for antineoplastic chemotherapy 10/29/2019  . Encounter for antineoplastic immunotherapy 10/29/2019  . Mediastinal adenopathy   . Endobronchial cancer, right ([place])   . Non-small cell carcinoma of right lung, stage 4 ([place]) 10/05/2019  . Cancer associated pain 10/05/2019  . Goals of care, counseling/discussion 10/05/2019  . Bone metastasis ([place]) 10/05/2019  . Tobacco abuse 10/05/2019  . Heroin abuse ([place]) 10/05/2019    Past Surgical  History:  Procedure Laterality Date  . APPENDECTOMY    . BRONCHIAL BIOPSY  10/12/2019   Procedure: BRONCHIAL BIOPSIES;  Surgeon: [name], DO;  Location: [facility];  Service: Thoracic;;  . BRONCHIAL NEEDLE ASPIRATION BIOPSY  10/12/2019   Procedure: BRONCHIAL NEEDLE ASPIRATION BIOPSIES;  Surgeon: [name], DO;  Location: [facility];  Service: Thoracic;;  . BRONCHIAL WASHINGS  10/12/2019   Procedure: BRONCHIAL WASHINGS;  Surgeon: [name], DO;  Location: [facility];  Service: Thoracic;;  . TONSILLECTOMY AND ADENOIDECTOMY    . VIDEO BRONCHOSCOPY WITH ENDOBRONCHIAL ULTRASOUND N/A 10/12/2019   Procedure: VIDEO BRONCHOSCOPY WITH ENDOBRONCHIAL ULTRASOUND;  Surgeon: [name], DO;  Location: [facility];  Service: Thoracic;  Laterality: N/A;  . WISDOM TOOTH EXTRACTION         No family history on file.  Social History   Tobacco Use  . Smoking status: Current Some Day Smoker    Packs/day: 1.00    Years: 30.00    Pack years: 30.00    Types: Cigarettes  . Smokeless tobacco: Never Used  . Tobacco comment: 1 cigarette every 2-3 days  Substance Use Topics  . Alcohol use: No  . Drug use: Yes    Types: Cocaine, IV, Marijuana, Heroin    Comment: heroin; last use cocaine 10/09/19  Home Medications Prior to Admission medications   Medication Sig Start Date End Date Taking? Authorizing Provider  acetaminophen (TYLENOL) 500 MG tablet Take 1,000 mg by mouth every 8 (eight) hours as needed for mild pain, moderate pain or headache.   Yes [provider]  fentaNYL (DURAGESIC) 100 MCG/HR Place 1 patch onto the skin every 3 (three) days.   Yes [provider]  Fluticasone-Umeclidin-Vilant (TRELEGY ELLIPTA) 100-62.5-25 MCG/INH AEPB Inhale 1 puff into the lungs daily. Patient taking differently: Inhale 1 puff into the lungs daily as needed (SOB, wheezing).  11/07/19  Yes Icard, Bradley L, DO  ibuprofen (ADVIL) 200 MG tablet Take 600-8,000 mg by mouth every  6 (six) hours as needed for headache, mild pain or moderate pain.    Yes [provider]  oxyCODONE-acetaminophen (PERCOCET) 10-325 MG tablet Take 1 tablet by mouth every 4 (four) hours as needed for pain.   Yes [provider]  albuterol (PROVENTIL) (2.5 MG/3ML) 0.083% nebulizer solution Take 3 mLs (2.5 mg total) by nebulization every 6 (six) hours as needed for wheezing or shortness of breath. Patient not taking: Reported on 12/26/2019 11/07/19   June Leap L, DO  prochlorperazine (COMPAZINE) 10 MG tablet Take 1 tablet (10 mg total) by mouth every 6 (six) hours as needed for nausea or vomiting. Patient not taking: Reported on 02/01/2020 11/26/19   Curt Bears, MD    Allergies    Egg [eggs or egg-derived products], Milk-related compounds, and Wheat bran  Review of Systems   Review of Systems Ten systems reviewed and are negative for acute change, except as noted in the HPI.   Physical Exam Updated Vital Signs BP 114/82   Pulse (!) 103   Temp 98.2 F (36.8 C)   Resp (!) 22   SpO2 95%   Physical Exam Vitals and nursing note reviewed.  Constitutional:      General: He is not in acute distress.    Appearance: He is well-developed. He is cachectic. He is not diaphoretic.  HENT:     Head: Normocephalic and atraumatic.  Eyes:     General: No scleral icterus.    Conjunctiva/sclera: Conjunctivae normal.  Cardiovascular:     Rate and Rhythm: Normal rate and regular rhythm.     Heart sounds: Normal heart sounds.  Pulmonary:     Effort: Pulmonary effort is normal. No respiratory distress.     Breath sounds: Normal breath sounds.  Abdominal:     Palpations: Abdomen is soft.     Tenderness: There is no abdominal tenderness.  Musculoskeletal:     Cervical back: Normal range of motion and neck supple.  Skin:    General: Skin is warm and dry.  Neurological:     Mental Status: He is alert.  Psychiatric:        Behavior: Behavior normal.     ED Results /  Procedures / Treatments   Labs (all labs ordered are listed, but only abnormal results are displayed) Labs Reviewed  COMPREHENSIVE METABOLIC PANEL - Abnormal; Notable for the following components:      Result Value   Sodium 134 (*)    Potassium 2.7 (*)    Chloride 95 (*)    BUN 24 (*)    Calcium 13.9 (*)    Albumin 2.4 (*)    Alkaline Phosphatase 137 (*)    All other components within normal limits  CBC WITH DIFFERENTIAL/PLATELET - Abnormal; Notable for the following components:   WBC 13.4 (*)  RBC 2.90 (*)    Hemoglobin 8.5 (*)    HCT 26.5 (*)    Neutro Abs 12.1 (*)    Lymphs Abs 0.3 (*)    Abs Immature Granulocytes 0.09 (*)    All other components within normal limits    EKG None  Radiology No results found.  Procedures .Critical Care Performed by: Margarita Mail, PA-C Authorized by: Margarita Mail, PA-C   Critical care provider statement:    Critical care time (minutes):  50   Critical care time was exclusive of:  Separately billable procedures and treating other patients   Critical care was necessary to treat or prevent imminent or life-threatening deterioration of the following conditions:  Metabolic crisis (Hypokalemia)   Critical care was time spent personally by me on the following activities:  Discussions with consultants, evaluation of patient's response to treatment, examination of patient, ordering and performing treatments and interventions, ordering and review of laboratory studies, ordering and review of radiographic studies, pulse oximetry, re-evaluation of patient's condition, obtaining history from patient or surrogate and review of old charts   (including critical care time)  Medications Ordered in ED Medications  HYDROmorphone (DILAUDID) injection 1 mg (has no administration in time range)  potassium chloride SA (KLOR-CON) CR tablet 40 mEq (has no administration in time range)  potassium chloride 10 mEq in 100 mL IVPB (has no administration in  time range)  sodium chloride 0.9 % bolus 500 mL (500 mLs Intravenous New Bag/Given 02/01/20 1433)  HYDROmorphone (DILAUDID) injection 0.5 mg (0.5 mg Intravenous Given 02/01/20 1432)    ED Course  I have reviewed the triage vital signs and the nursing notes.  Pertinent labs & imaging results that were available during my care of the patient were reviewed by me and considered in my medical decision making (see chart for details).    MDM Rules/Calculators/A&P                      This patient complains of traumatic Fall and diffuse pain, this involves an extensive number of treatment options, and is a complaint that carries with it a high risk of complications and morbidity.  The differential diagnosis includes The emergent differential diagnosis for trauma is extensive and requires complex medical decision making. The differential includes, but is not limited to traumatic brain injury, Orbital trauma, maxillofacial trauma, skull fracture, blunt/penetrating neck trauma, vertebral artery dissection, whiplash, cervical fracture, neurogenic shock, spinal cord injury, thoracic trauma (blunt/penetrating) cardiac trauma, thoracic and lumbar spine trauma. Abdominal trauma (blunt. Penetrating), genitourinary trauma, extremity fractures, skin lacerations/ abrasions, vascular injuries.   I Ordered, reviewed, and interpreted labs, which included CMP which shows potassium of 2.7 and calcium of almost 14.  CBC shows elevated white blood cell count at 13.4, baseline normocytic anemia, magnesium level is low at 1.5.  Covid test is pending. I ordered medication including the patient's home pain medication and IV pain medication along with IV and p.o. potassium and magnesium for hypokalemia and end-of-life chronic pain with acute traumatic pain I ordered imaging studies which included 2 view chest x-ray, lumbar film, CT head and C-spine and I independently visualized and interpreted imaging which showed multiple  chronic lytic lesions with worsening disease process along with acute fourth and ninth rib fracture without pneumothorax Additional history obtained from the patient and hospice nurse Jagual Previous records obtained and reviewed  I consulted hospice care and hospital service and discussed lab and imaging findings  Critical interventions: Potassium supplementation  After the interventions stated above, I reevaluated the patient and found that his pain was improved.  The patient is here after traumatic injury.  He is cachectic and has ceased further treatment of his stage IV metastatic non-small cell lung carcinoma.  I consulted with Sheilah Pigeon.  The patient apparently does have hospice care at home however due to the fact that he is unable to walk anymore feels that he cannot stay at home anymore.  Nurse Edison Pace feels that the patient is within the 2-week mark of the end of life and will look for bed placement.  She states however there is a wait and request that the patient be admitted.  I discussed the case with Dr. Neysa Bonito who will admit the patient for end-of-life care  Final Clinical Impression(s) / ED Diagnoses Final diagnoses:  None    Rx / DC Orders ED Discharge Orders    None       Margarita Mail, PA-C 02/03/20 Davis, Ankit, MD 02/03/20 2116

## 2020-02-01 NOTE — Progress Notes (Signed)
Manufacturing engineer [facility])  Referral received for residential hospice at [facility].  [facility] does not have a bed to offer today.  Unclear if pt is under hospice services with another agency as he was referred to a hospice closer to his home in [place].  Pt mentions that he "was not aggressive" with said hospice when asked.    He is interested in [facility] if a bed becomes available.   Tentative plans to admit for symptom management control.  [facility] will update pt and hospital once bed is available.  Venia Carbon RN, BSN, [facility]

## 2020-02-01 NOTE — H&P (Addendum)
History and Physical    Nicolas Welch:[id number] DOB: Jan 16, 1971 DOA: 02/01/2020  PCP: Patient, No Pcp Per  Patient coming from: home   Chief Complaint: fall, weakness  HPI: Nicolas Welch is a 49 y.o. 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  Cancer diagnosed January of this year. Treated with palliative radiation and started on palliative chemotherapy which he did not tolerate. Last saw oncology 4/13, decision at that time to transition to hospice. However, the patient has more recently declined in home hospice services, preferring to care for himself.  Over the last several weeks the patient reports a steady decline. He has worsening pain and weakness. Very little appetite and is eating and drinking little. Yesterday fell down several steps, no LOC but did hit head. Denies vomiting or diarrhea. Denies shortness of breath. Denies covid contacts.   Desires inpatient hospice, does not think can care for himself at home. Lives alone.  ED Course: labs, imaging, pain control, potassium, 500 cc NS. EDP spoke w/ hospice nurse who says a bed should be available @ [facility] in the next couple of days.  Review of Systems: As per HPI otherwise 10 point review of systems negative.    Past Medical History:  Diagnosis Date  . Anginal pain ([place])   . Bipolar disorder ([place])   . Cancer ([place])     " lung and metastatic bone cancer "  . Depression   . GERD (gastroesophageal reflux disease)   . Hepatitis    HEP C  . Lung mass     Past Surgical History:  Procedure Laterality Date  . APPENDECTOMY    . BRONCHIAL BIOPSY  10/12/2019   Procedure: BRONCHIAL BIOPSIES;  Surgeon: [name], DO;  Location: [facility];  Service: Thoracic;;  . BRONCHIAL NEEDLE ASPIRATION BIOPSY  10/12/2019   Procedure: BRONCHIAL NEEDLE ASPIRATION BIOPSIES;  Surgeon: [name], DO;  Location: [facility];  Service: Thoracic;;  . BRONCHIAL WASHINGS  10/12/2019   Procedure: BRONCHIAL WASHINGS;  Surgeon: [name], DO;  Location: [facility];  Service: Thoracic;;  . TONSILLECTOMY AND ADENOIDECTOMY    . VIDEO BRONCHOSCOPY WITH ENDOBRONCHIAL ULTRASOUND N/A 10/12/2019   Procedure: VIDEO BRONCHOSCOPY WITH ENDOBRONCHIAL ULTRASOUND;  Surgeon: [name], DO;  Location: [facility];  Service: Thoracic;  Laterality: N/A;  . WISDOM TOOTH EXTRACTION       reports that he has been smoking cigarettes. He has a 30.00 pack-year smoking history. He has never used smokeless tobacco. He reports current drug use. Drugs: Cocaine, IV, Marijuana, and Heroin. He reports that he does not drink alcohol.  Allergies  Allergen Reactions  . Egg [Eggs Or Egg-Derived Products] Hives, Itching and Swelling  . Milk-Related Compounds Hives, Itching and Swelling  . Wheat Bran Hives, Itching and Swelling    No family history on file.  Prior to Admission medications   Medication Sig Start Date End Date Taking? Authorizing Provider  acetaminophen (TYLENOL) 500 MG tablet Take 1,000 mg by mouth every 8 (eight) hours as needed for mild pain, moderate pain or headache.   Yes [provider]  fentaNYL (DURAGESIC) 100 MCG/HR Place 1 patch onto the skin every 3 (three) days.   Yes [provider]  Fluticasone-Umeclidin-Vilant (TRELEGY ELLIPTA) 100-62.5-25 MCG/INH AEPB Inhale 1 puff into the lungs daily. Patient taking differently: Inhale 1 puff into the lungs daily as needed (  SOB, wheezing).  11/07/19  Yes Icard, Bradley L, DO  ibuprofen (ADVIL) 200 MG tablet Take 600-8,000 mg by mouth every 6 (six) hours as needed for headache, mild pain or moderate pain.    Yes [provider]  oxyCODONE-acetaminophen (PERCOCET) 10-325 MG tablet Take 1 tablet by mouth every 4 (four) hours as needed for pain.   Yes [provider]  albuterol (PROVENTIL) (2.5 MG/3ML) 0.083%  nebulizer solution Take 3 mLs (2.5 mg total) by nebulization every 6 (six) hours as needed for wheezing or shortness of breath. Patient not taking: Reported on 12/26/2019 11/07/19   June Leap L, DO  prochlorperazine (COMPAZINE) 10 MG tablet Take 1 tablet (10 mg total) by mouth every 6 (six) hours as needed for nausea or vomiting. Patient not taking: Reported on 02/01/2020 11/26/19   Curt Bears, MD    Physical Exam: Vitals:   02/01/20 1530 02/01/20 1600 02/01/20 1700 02/01/20 1800  BP: 131/85 138/87 113/72 119/68  Pulse: (!) 54 (!) 107 (!) 104 100  Resp: 20 17 19  (!) 23  Temp:      SpO2: 99% 99% 99% 98%    Constitutional: cachectic, ill appearing Head: Atraumatic Eyes: Conjunctiva clear ENM: dry mucous membranes. poordentition.  Neck: Supple Respiratory: faint rales at bases, scattered rhonchi, poor insp effort Cardiovascular: Regular rate and rhythm. Soft systolic murmur Abdomen: scafoid, diffuse mild ttp, no rebound Musculoskeletal: decreased muscle tone Skin: dirty hands Extremities: No peripheral edema. Palpable peripheral pulses. Neurologic: Alert, moving all 4 extremities. Psychiatric: Normal insight and judgement.   Labs on Admission: I have personally reviewed following labs and imaging studies  CBC: Recent Labs  Lab 02/01/20 1433  WBC 13.4*  NEUTROABS 12.1*  HGB 8.5*  HCT 26.5*  MCV 91.4  PLT 388   Basic Metabolic Panel: Recent Labs  Lab 02/01/20 1433  NA 134*  K 2.7*  CL 95*  CO2 29  GLUCOSE 93  BUN 24*  CREATININE 0.98  CALCIUM 13.9*   GFR: CrCl cannot be calculated (Unknown ideal weight.). Liver Function Tests: Recent Labs  Lab 02/01/20 1433  AST 35  ALT 19  ALKPHOS 137*  BILITOT 0.8  PROT 7.0  ALBUMIN 2.4*   No results for input(s): LIPASE, AMYLASE in the last 168 hours. No results for input(s): AMMONIA in the last 168 hours. Coagulation Profile: No results for input(s): INR, PROTIME in the last 168 hours. Cardiac  Enzymes: No results for input(s): CKTOTAL, CKMB, CKMBINDEX, TROPONINI in the last 168 hours. BNP (last 3 results) No results for input(s): PROBNP in the last 8760 hours. HbA1C: No results for input(s): HGBA1C in the last 72 hours. CBG: No results for input(s): GLUCAP in the last 168 hours. Lipid Profile: No results for input(s): CHOL, HDL, LDLCALC, TRIG, CHOLHDL, LDLDIRECT in the last 72 hours. Thyroid Function Tests: No results for input(s): TSH, T4TOTAL, FREET4, T3FREE, THYROIDAB in the last 72 hours. Anemia Panel: No results for input(s): VITAMINB12, FOLATE, FERRITIN, TIBC, IRON, RETICCTPCT in the last 72 hours. Urine analysis: No results found for: COLORURINE, APPEARANCEUR, LABSPEC, PHURINE, GLUCOSEU, HGBUR, BILIRUBINUR, KETONESUR, PROTEINUR, UROBILINOGEN, NITRITE, LEUKOCYTESUR  Radiological Exams on Admission: DG Chest 2 View  Result Date: 02/01/2020 CLINICAL DATA:  Diffuse pain following a fall from a porch 1 day ago. Metastatic lung cancer. EXAM: CHEST - 2 VIEW COMPARISON:  Chest, abdomen and pelvis CT dated 12/14/2019. FINDINGS: Increased ill-defined densities in both lungs with areas of nodularity. Interval displaced right posterior 4th rib fracture. Mild cortical irregularity in the  inferior distal right clavicle without significant change. Lytic metastasis in the lateral aspect of the right 1st rib. Lytic destruction of the majority of the left 8th posterior rib and mildly displaced fracture of the left posterior 9th rib in an area of lytic destruction. No pneumothorax. Normal sized heart. IMPRESSION: 1. Interval displaced right posterior 4th rib fracture and mildly displaced left posterior 9th rib fracture without pneumothorax. 2. Progressive pulmonary, pleural and bony metastatic disease. Electronically Signed   By: Claudie Revering M.D.   On: 02/01/2020 16:05   DG Lumbar Spine 2-3 Views  Result Date: 02/01/2020 CLINICAL DATA:  Low back pain following a fall from a porch yesterday.  EXAM: LUMBAR SPINE - 2-3 VIEW COMPARISON:  Abdomen and pelvis CT dated 12/14/2019 FINDINGS: Multiple lytic lesions in the lumbar spine are better visualized on the previous CT images. There has been no significant change in a mildly comminuted fracture of the L3 vertebral body on the right with mild compression. A pathological fracture of the superior aspect of the L5 vertebral body is again demonstrated. No acute fractures or subluxations seen. Atheromatous arterial calcifications. IMPRESSION: 1. No acute fracture or subluxation. 2. Stable mildly comminuted pathological fracture of the L3 vertebral body on the right with mild compression. 3. A mild superior endplate pathological fracture of the L5 vertebra is again demonstrated. Electronically Signed   By: Claudie Revering M.D.   On: 02/01/2020 16:00   CT Head Wo Contrast  Result Date: 02/01/2020 CLINICAL DATA:  Acute pain due to trauma EXAM: CT HEAD WITHOUT CONTRAST CT CERVICAL SPINE WITHOUT CONTRAST TECHNIQUE: Multidetector CT imaging of the head and cervical spine was performed following the standard protocol without intravenous contrast. Multiplanar CT image reconstructions of the cervical spine were also generated. COMPARISON:  MRI dated December 02, 2019.  PET-CT dated 11/02/2019 FINDINGS: CT HEAD FINDINGS Brain: No evidence of acute infarction, hemorrhage, hydrocephalus, extra-axial collection or mass lesion/mass effect. Vascular: No hyperdense vessel or unexpected calcification. Skull: Again noted is a soft tissue mass centered in the left jugular foramen resulting in adjacent osseous destruction. This has progressed since the patient's prior PET-CT dated 11/02/2019 Sinuses/Orbits: The paranasal sinuses are essentially clear. There is opacification of the left mastoid air cells. The right mastoid air cells are clear. Other: None. CT CERVICAL SPINE FINDINGS Alignment: Normal. Skull base and vertebrae: Multiple lytic lesions are noted, specifically involving the  C7, T1, T2, and T3 vertebral bodies. There is no definite evidence for an acute fracture. There is a lytic lesion involving the right first rib as well as the left second rib. Soft tissues and spinal canal: No prevertebral fluid or swelling. No visible canal hematoma. There is no significant Disc levels: Mild multilevel disc height loss is noted throughout the cervical spine. Upper chest: Negative. Other: None IMPRESSION: 1. No acute traumatic abnormality. 2. Again identified are metastatic lytic lesions involving the skull base and cervical spine. These have significantly progressed since the patient's prior imaging. Electronically Signed   By: Constance Holster M.D.   On: 02/01/2020 15:55   CT Cervical Spine Wo Contrast  Result Date: 02/01/2020 CLINICAL DATA:  Acute pain due to trauma EXAM: CT HEAD WITHOUT CONTRAST CT CERVICAL SPINE WITHOUT CONTRAST TECHNIQUE: Multidetector CT imaging of the head and cervical spine was performed following the standard protocol without intravenous contrast. Multiplanar CT image reconstructions of the cervical spine were also generated. COMPARISON:  MRI dated December 02, 2019.  PET-CT dated 11/02/2019 FINDINGS: CT HEAD FINDINGS Brain: No evidence  of acute infarction, hemorrhage, hydrocephalus, extra-axial collection or mass lesion/mass effect. Vascular: No hyperdense vessel or unexpected calcification. Skull: Again noted is a soft tissue mass centered in the left jugular foramen resulting in adjacent osseous destruction. This has progressed since the patient's prior PET-CT dated 11/02/2019 Sinuses/Orbits: The paranasal sinuses are essentially clear. There is opacification of the left mastoid air cells. The right mastoid air cells are clear. Other: None. CT CERVICAL SPINE FINDINGS Alignment: Normal. Skull base and vertebrae: Multiple lytic lesions are noted, specifically involving the C7, T1, T2, and T3 vertebral bodies. There is no definite evidence for an acute fracture. There  is a lytic lesion involving the right first rib as well as the left second rib. Soft tissues and spinal canal: No prevertebral fluid or swelling. No visible canal hematoma. There is no significant Disc levels: Mild multilevel disc height loss is noted throughout the cervical spine. Upper chest: Negative. Other: None IMPRESSION: 1. No acute traumatic abnormality. 2. Again identified are metastatic lytic lesions involving the skull base and cervical spine. These have significantly progressed since the patient's prior imaging. Electronically Signed   By: Constance Holster M.D.   On: 02/01/2020 15:55    EKG: pending  Assessment/Plan Principal Problem:   Cancer associated pain Active Problems:   Non-small cell carcinoma of right lung, stage 4 (HCC)   Hypercalcemia of malignancy   Hypokalemia   # Metastatic lung cancer # Chronic pain - life expectancy currently not long, pt aware of this and accepts it. In severe pain and unable to care for himself at home, see for example fall yesterday with several rib fractures. Has forgone palliative chemotherapy. - resume home fentanyl patch, will also give fentanyl IV prn as pt says it works best - cont compazine as needed for nausea - plan to pursue inpatient hospice  # Hypokalemia - k 2.7, likely 2/2 decreased intake. S/p 40 meq oral and 10 meq IV in ED - f/u mg, 2 mg IV mg ordered empirically - another 80 meq oral potassium ordered - f/u ekg - repeat K in AM  # Hypercalcemia - severe, corrects to 15.2, secondary to malignancy. S/p 500 cc NS in ED - NS @ 225/hr overnight - repeat calcium in AM  # Acute rib fractures - displaced r 4th rib fx, mildly displaced L 9th rib fx. Normal wob, no signs pneumothorax, o2 normal. Nothing acute on CT of head and c spine. - pain control as above; CTM for signs respiratory compromise  # Anemia - h 8.5, stable from last month. No signs of bleeding.  DVT prophylaxis: lovenox Code Status: dnr, confirmed w/ pt   Family Communication: girlfriend julie (no answer when attempted to update telephonically) Disposition Plan: likely inpt hospice  Consults called: none  Admission status: med/surg    Desma Maxim MD [facility] Pager [phone]  If 7PM-7AM, please contact night-coverage [email or web address] Password [facility]  02/01/2020, 6:30 PM

## 2020-02-01 NOTE — ED Notes (Signed)
Attempted to call report to RN x1, asked to call back in 10-15 minutes.

## 2020-02-01 NOTE — ED Triage Notes (Signed)
Pt reports fell off porch yesterday. C/o pains all over. Pt is stage 4 lung cancer.

## 2020-02-02 ENCOUNTER — Other Ambulatory Visit: Payer: Self-pay

## 2020-02-02 ENCOUNTER — Encounter (HOSPITAL_COMMUNITY): Payer: Self-pay | Admitting: Obstetrics and Gynecology

## 2020-02-02 DIAGNOSIS — W19XXXA Unspecified fall, initial encounter: Secondary | ICD-10-CM

## 2020-02-02 DIAGNOSIS — S2243XA Multiple fractures of ribs, bilateral, initial encounter for closed fracture: Secondary | ICD-10-CM

## 2020-02-02 DIAGNOSIS — E876 Hypokalemia: Secondary | ICD-10-CM

## 2020-02-02 DIAGNOSIS — G893 Neoplasm related pain (acute) (chronic): Secondary | ICD-10-CM

## 2020-02-02 LAB — COMPREHENSIVE METABOLIC PANEL
ALT: 18 U/L (ref 0–44)
ALT: 18 U/L (ref 0–44)
ALT: 17 U/L (ref 0–44)
ALT: 18 U/L (ref 0–44)
AST: 31 U/L (ref 15–41)
AST: 30 U/L (ref 15–41)
AST: 31 U/L (ref 15–41)
AST: 32 U/L (ref 15–41)
Albumin: 2.4 g/dL — ABNORMAL LOW (ref 3.5–5.0)
Albumin: 2.3 g/dL — ABNORMAL LOW (ref 3.5–5.0)
Albumin: 2.3 g/dL — ABNORMAL LOW (ref 3.5–5.0)
Albumin: 2.3 g/dL — ABNORMAL LOW (ref 3.5–5.0)
Alkaline Phosphatase: 127 U/L — ABNORMAL HIGH (ref 38–126)
Alkaline Phosphatase: 130 U/L — ABNORMAL HIGH (ref 38–126)
Alkaline Phosphatase: 133 U/L — ABNORMAL HIGH (ref 38–126)
Alkaline Phosphatase: 133 U/L — ABNORMAL HIGH (ref 38–126)
Anion gap: 10 (ref 5–15)
Anion gap: 8 (ref 5–15)
Anion gap: 7 (ref 5–15)
Anion gap: 9 (ref 5–15)
BUN: 20 mg/dL (ref 6–20)
BUN: 19 mg/dL (ref 6–20)
BUN: 18 mg/dL (ref 6–20)
BUN: 15 mg/dL (ref 6–20)
CO2: 23 mmol/L (ref 22–32)
CO2: 25 mmol/L (ref 22–32)
CO2: 26 mmol/L (ref 22–32)
CO2: 24 mmol/L (ref 22–32)
Calcium: 13.2 mg/dL (ref 8.9–10.3)
Calcium: 12.8 mg/dL — ABNORMAL HIGH (ref 8.9–10.3)
Calcium: 13 mg/dL — ABNORMAL HIGH (ref 8.9–10.3)
Calcium: 13.1 mg/dL (ref 8.9–10.3)
Chloride: 99 mmol/L (ref 98–111)
Chloride: 101 mmol/L (ref 98–111)
Chloride: 99 mmol/L (ref 98–111)
Chloride: 102 mmol/L (ref 98–111)
Creatinine, Ser: 0.98 mg/dL (ref 0.61–1.24)
Creatinine, Ser: 0.95 mg/dL (ref 0.61–1.24)
Creatinine, Ser: 0.96 mg/dL (ref 0.61–1.24)
Creatinine, Ser: 0.92 mg/dL (ref 0.61–1.24)
GFR calc Af Amer: >60 mL/min (ref >60)
GFR calc Af Amer: >60 mL/min (ref >60)
GFR calc Af Amer: >60 mL/min (ref >60)
GFR calc Af Amer: >60 mL/min (ref >60)
GFR calc non Af Amer: >60 mL/min (ref >60)
GFR calc non Af Amer: >60 mL/min (ref >60)
GFR calc non Af Amer: >60 mL/min (ref >60)
GFR calc non Af Amer: >60 mL/min (ref >60)
Glucose, Bld: 84 mg/dL (ref 70–99)
Glucose, Bld: 100 mg/dL — ABNORMAL HIGH (ref 70–99)
Glucose, Bld: 95 mg/dL (ref 70–99)
Glucose, Bld: 92 mg/dL (ref 70–99)
Potassium: 3.2 mmol/L — ABNORMAL LOW (ref 3.5–5.1)
Potassium: 3.2 mmol/L — ABNORMAL LOW (ref 3.5–5.1)
Potassium: 2.8 mmol/L — ABNORMAL LOW (ref 3.5–5.1)
Potassium: 3.9 mmol/L (ref 3.5–5.1)
Sodium: 132 mmol/L — ABNORMAL LOW (ref 135–145)
Sodium: 134 mmol/L — ABNORMAL LOW (ref 135–145)
Sodium: 132 mmol/L — ABNORMAL LOW (ref 135–145)
Sodium: 135 mmol/L (ref 135–145)
Total Bilirubin: 0.7 mg/dL (ref 0.3–1.2)
Total Bilirubin: 0.6 mg/dL (ref 0.3–1.2)
Total Bilirubin: 0.9 mg/dL (ref 0.3–1.2)
Total Bilirubin: 0.7 mg/dL (ref 0.3–1.2)
Total Protein: 6.7 g/dL (ref 6.5–8.1)
Total Protein: 6.6 g/dL (ref 6.5–8.1)
Total Protein: 6.8 g/dL (ref 6.5–8.1)
Total Protein: 7.1 g/dL (ref 6.5–8.1)

## 2020-02-02 LAB — MAGNESIUM
Magnesium: 1.2 mg/dL — ABNORMAL LOW (ref 1.7–2.4)
Magnesium: 2.7 mg/dL — ABNORMAL HIGH (ref 1.7–2.4)

## 2020-02-02 MED ORDER — NALOXONE HCL 0.4 MG/ML IJ SOLN: 0.4000 mg | INTRAMUSCULAR | Status: DC | PRN

## 2020-02-02 MED ORDER — CALCITONIN (SALMON) 200 UNIT/ML IJ SOLN
4.0000 [IU]/kg | Freq: Two times a day (BID) | INTRAMUSCULAR | Status: DC
  Administered 2020-02-02: 244 [IU] via SUBCUTANEOUS
  Filled 2020-02-02 (×2): qty 1.22

## 2020-02-02 MED ORDER — MORPHINE SULFATE (PF) 2 MG/ML IV SOLN
2.0000 mg | Freq: Once | INTRAVENOUS | Status: AC
  Administered 2020-02-02: 2 mg via INTRAVENOUS
  Filled 2020-02-02: qty 1

## 2020-02-02 MED ORDER — DIPHENHYDRAMINE HCL 12.5 MG/5ML PO ELIX: 12.5000 mg | ORAL_SOLUTION | Freq: Four times a day (QID) | ORAL | Status: DC | PRN

## 2020-02-02 MED ORDER — DIPHENHYDRAMINE HCL 50 MG/ML IJ SOLN: 12.5000 mg | Freq: Four times a day (QID) | INTRAMUSCULAR | Status: DC | PRN

## 2020-02-02 MED ORDER — NICOTINE 14 MG/24HR TD PT24
14.0000 mg | MEDICATED_PATCH | Freq: Every day | TRANSDERMAL | Status: DC
  Administered 2020-02-02 – 2020-02-03 (×2): 14 mg via TRANSDERMAL
  Filled 2020-02-02 (×2): qty 1

## 2020-02-02 MED ORDER — ZOLEDRONIC ACID 4 MG/5ML IV CONC
4.0000 mg | Freq: Once | INTRAVENOUS | Status: AC
  Administered 2020-02-02: 4 mg via INTRAVENOUS
  Filled 2020-02-02: qty 5

## 2020-02-02 MED ORDER — LORAZEPAM 2 MG/ML IJ SOLN
1.0000 mg | INTRAMUSCULAR | Status: DC | PRN
  Administered 2020-02-02: 1 mg via INTRAVENOUS
  Filled 2020-02-02: qty 1

## 2020-02-02 MED ORDER — MAGNESIUM SULFATE 4 GM/100ML IV SOLN
4.0000 g | Freq: Once | INTRAVENOUS | Status: AC
  Administered 2020-02-02: 4 g via INTRAVENOUS
  Filled 2020-02-02: qty 100

## 2020-02-02 MED ORDER — TRAZODONE HCL 50 MG PO TABS
50.0000 mg | ORAL_TABLET | Freq: Every evening | ORAL | Status: DC | PRN
  Administered 2020-02-02: 50 mg via ORAL
  Filled 2020-02-02: qty 1

## 2020-02-02 MED ORDER — KETOROLAC TROMETHAMINE 15 MG/ML IJ SOLN: 15.0000 mg | Freq: Four times a day (QID) | INTRAMUSCULAR | Status: DC | PRN

## 2020-02-02 MED ORDER — LORAZEPAM 2 MG/ML IJ SOLN
1.0000 mg | Freq: Four times a day (QID) | INTRAMUSCULAR | Status: DC | PRN
  Administered 2020-02-02 (×2): 1 mg via INTRAVENOUS
  Filled 2020-02-02 (×2): qty 1

## 2020-02-02 MED ORDER — POTASSIUM CHLORIDE 10 MEQ/100ML IV SOLN
10.0000 meq | INTRAVENOUS | Status: AC
  Administered 2020-02-02 (×2): 10 meq via INTRAVENOUS
  Filled 2020-02-02 (×2): qty 100

## 2020-02-02 MED ORDER — SODIUM CHLORIDE 0.9% FLUSH: 9.0000 mL | INTRAVENOUS | Status: DC | PRN

## 2020-02-02 MED ORDER — ENSURE ENLIVE PO LIQD: 237.0000 mL | Freq: Two times a day (BID) | ORAL | Status: DC

## 2020-02-02 MED ORDER — POTASSIUM CHLORIDE CRYS ER 20 MEQ PO TBCR
20.0000 meq | EXTENDED_RELEASE_TABLET | Freq: Once | ORAL | Status: AC
  Administered 2020-02-02: 20 meq via ORAL
  Filled 2020-02-02: qty 1

## 2020-02-02 MED ORDER — HYDROMORPHONE 1 MG/ML IV SOLN
INTRAVENOUS | Status: DC
  Administered 2020-02-02: 0.6 mg via INTRAVENOUS
  Administered 2020-02-02: 3 mg via INTRAVENOUS
  Administered 2020-02-02: 3.5 mg via INTRAVENOUS
  Administered 2020-02-02 (×2): 1.8 mg via INTRAVENOUS
  Administered 2020-02-02: 30 mg via INTRAVENOUS
  Administered 2020-02-03: 0 mg via INTRAVENOUS
  Filled 2020-02-02: qty 30

## 2020-02-02 MED ORDER — POTASSIUM CHLORIDE CRYS ER 20 MEQ PO TBCR
40.0000 meq | EXTENDED_RELEASE_TABLET | Freq: Once | ORAL | Status: AC
  Administered 2020-02-02: 40 meq via ORAL
  Filled 2020-02-02: qty 2

## 2020-02-02 MED ORDER — HYDROMORPHONE HCL 1 MG/ML IJ SOLN
1.0000 mg | Freq: Once | INTRAMUSCULAR | Status: AC
  Administered 2020-02-02: 1 mg via INTRAVENOUS
  Filled 2020-02-02: qty 1

## 2020-02-02 NOTE — Progress Notes (Signed)
   02/02/20 1734  Assess: MEWS Score  Temp 98 F (36.7 C)  BP 135/86  Pulse Rate (!) 103  Resp (!) 35  Level of Consciousness Alert  SpO2 98 %  O2 Device Room Air  Assess: MEWS Score  MEWS Temp 0  MEWS Systolic 0  MEWS Pulse 1  MEWS RR 2  MEWS LOC 0  MEWS Score 3  MEWS Score Color Yellow  Assess: if the MEWS score is Yellow or Red  Were vital signs taken at a resting state? Yes  Focused Assessment Documented focused assessment  Early Detection of Sepsis Score *See Row Information* Low  MEWS guidelines implemented *See Row Information* No, previously yellow, continue vital signs every 4 hours  Treat  MEWS Interventions Administered scheduled meds/treatments  Take Vital Signs  Increase Vital Sign Frequency  Yellow: Q 2hr X 2 then Q 4hr X 2, if remains yellow, continue Q 4hrs  Escalate  MEWS: Escalate Yellow: discuss with charge nurse/RN and consider discussing with provider and RRT  Notify: Charge Nurse/RN  Name of Charge Nurse/RN Notified Daeja RN   Date Charge Nurse/RN Notified 02/02/20  Time Charge Nurse/RN Notified 1735  Document  Patient Outcome Stabilized after interventions  Progress note created (see row info) Yes   Pt RR elevated,MEWS Guidelines - (patients age 49 and over)   triggering YELLOW MEWS protocol. Pt resting comfortably otherwise, no distress noted.  Will continue to monitor.

## 2020-02-02 NOTE — Progress Notes (Signed)
   02/02/20 2024  Assess: MEWS Score  Resp (!) 29  SpO2 98 %  O2 Device Room Air  Assess: MEWS Score  MEWS Temp 0  MEWS Systolic 0  MEWS Pulse 1  MEWS RR 2  MEWS LOC 0  MEWS Score 3  MEWS Score Color Yellow  Assess: if the MEWS score is Yellow or Red  Were vital signs taken at a resting state? Yes  Focused Assessment Documented focused assessment  Early Detection of Sepsis Score *See Row Information* Low  MEWS guidelines implemented *See Row Information* No, previously yellow, continue vital signs every 4 hours  Treat  MEWS Interventions Administered scheduled meds/treatments  Take Vital Signs  Increase Vital Sign Frequency  Yellow: Q 2hr X 2 then Q 4hr X 2, if remains yellow, continue Q 4hrs  Escalate  MEWS: Escalate Yellow: discuss with charge nurse/RN and consider discussing with provider and RRT  Notify: Charge Nurse/RN  Name of Charge Nurse/RN Notified [place], RN  Date Charge Nurse/RN Notified 02/02/20  Time Charge Nurse/RN Notified 2028  Notify: Provider  Provider Name/Title  (MD aware; This is an onging issue. )  Document  Patient Outcome Stabilized after interventions  Progress note created (see row info) Yes

## 2020-02-02 NOTE — Progress Notes (Addendum)
   02/02/20 1004  Assess: MEWS Score  Temp 98.6 F (37 C)  BP 127/79  Pulse Rate (!) 114  Resp (!) 33  Level of Consciousness Alert  SpO2 96 %  O2 Device Room Air  Assess: MEWS Score  MEWS Temp 0  MEWS Systolic 0  MEWS Pulse 2  MEWS RR 2  MEWS LOC 0  MEWS Score 4  MEWS Score Color Red  Assess: if the MEWS score is Yellow or Red  Were vital signs taken at a resting state? Yes  Focused Assessment Documented focused assessment  Early Detection of Sepsis Score *See Row Information* Medium  MEWS guidelines implemented *See Row Information* No, previously red, continue vital signs every 4 hours  Treat  MEWS Interventions Administered scheduled meds/treatments  Take Vital Signs  Increase Vital Sign Frequency  Red: Q 1hr X 4 then Q 4hr X 4, if remains red, continue Q 4hrs  Escalate  MEWS: Escalate Red: discuss with charge nurse/RN and provider, consider discussing with RRT  Notify: Charge Nurse/RN  Name of Charge Nurse/RN Notified Daeja RN   Date Charge Nurse/RN Notified 02/02/20  Time Charge Nurse/RN Notified 1004  Document  Patient Outcome Stabilized after interventions  Progress note created (see row info) Yes   HR and RR elevated, no acute changes in status. Medications given per order. Pt now resting comfortably. MD and charge RN aware, will continue to monitor. MEWS Guidelines - (patients age 36 and over)

## 2020-02-02 NOTE — Progress Notes (Signed)
CRITICAL VALUE ALERT  Critical Value:  Calcium 13.2  Date & Time Notied:  02/02/20  Provider Notified: Neysa Bonito  Orders Received/Actions taken:

## 2020-02-02 NOTE — Progress Notes (Signed)
Patient continues to have a yellow MEWS score. He has an elevated respiratory rate.  Will continue to monitor.Roderick Pee

## 2020-02-02 NOTE — Progress Notes (Addendum)
   02/02/20 1103  Assess: MEWS Score  Temp 98.6 F (37 C)  BP 119/81  Pulse Rate (!) 110  Resp (!) 28  Level of Consciousness Alert  SpO2 96 %  O2 Device Room Air  Assess: MEWS Score  MEWS Temp 0  MEWS Systolic 0  MEWS Pulse 1  MEWS RR 2  MEWS LOC 0  MEWS Score 3  MEWS Score Color Yellow  Assess: if the MEWS score is Yellow or Red  Were vital signs taken at a resting state? Yes  Focused Assessment Documented focused assessment  Early Detection of Sepsis Score *See Row Information* Medium  MEWS guidelines implemented *See Row Information* No, previously red, continue vital signs every 4 hours  Treat  MEWS Interventions Administered scheduled meds/treatments;Administered prn meds/treatments  Take Vital Signs  Increase Vital Sign Frequency  Yellow: Q 2hr X 2 then Q 4hr X 2, if remains yellow, continue Q 4hrs  Escalate  MEWS: Escalate Yellow: discuss with charge nurse/RN and consider discussing with provider and RRT  Notify: Charge Nurse/RN  Name of Charge Nurse/RN Notified Daeja RN   Date Charge Nurse/RN Notified 02/02/20  Time Charge Nurse/RN Notified 1105  Document  Patient Outcome Stabilized after interventions  Progress note created (see row info) Yes   Pt's VS improved with better pain control. Pt resting comfortably. MEWS protocol now triggering yellow. Will continue to monitor. MEWS Guidelines - (patients age 25 and over)

## 2020-02-02 NOTE — Progress Notes (Addendum)
   02/02/20 0904  Assess: MEWS Score  Temp (!) 97.4 F (36.3 C)  BP 121/80  Pulse Rate (!) 116  Resp (!) 30  Level of Consciousness Alert  SpO2 98 %  O2 Device Room Air  Assess: MEWS Score  MEWS Temp 0  MEWS Systolic 0  MEWS Pulse 2  MEWS RR 2  MEWS LOC 0  MEWS Score 4  MEWS Score Color Red  Assess: if the MEWS score is Yellow or Red  Were vital signs taken at a resting state? Yes  Focused Assessment Documented focused assessment  Early Detection of Sepsis Score *See Row Information* Medium  MEWS guidelines implemented *See Row Information* Yes  Treat  MEWS Interventions Escalated (See documentation below)  Take Vital Signs  Increase Vital Sign Frequency  Red: Q 1hr X 4 then Q 4hr X 4, if remains red, continue Q 4hrs  Escalate  MEWS: Escalate Red: discuss with charge nurse/RN and provider, consider discussing with RRT  Notify: Charge Nurse/RN  Name of Charge Nurse/RN Notified Daeja RN  Date Charge Nurse/RN Notified 02/02/20  Time Charge Nurse/RN Notified 3500  Notify: Provider  Provider Name/Title Neysa Bonito  Date Provider Notified 02/02/20  Time Provider Notified [phone]  Notification Type Page  Notification Reason Change in status  Response See new orders  Date of Provider Response 02/02/20  Time of Provider Response 0910  Document  Patient Outcome Stabilized after interventions  Progress note created (see row info) Yes   Pt RR and HR triggering RED MEWS protocol. MD on floor, made aware. Charge RN made aware. Increased pain medications ordered. Will continue to monitor.  MEWS Guidelines - (patients age 49 and over)

## 2020-02-02 NOTE — Progress Notes (Signed)
CRITICAL VALUE ALERT  Critical Value:  Calcium 13.0  Date & Time Notied:  05/29 1714   Provider Notified: Neysa Bonito   Orders Received/Actions taken:

## 2020-02-02 NOTE — Progress Notes (Signed)
CRITICAL VALUE ALERT  Critical Value:  Calcium 13.1  Date & Time Notied:  05/29 2318  Provider Notified: Stark Klein  Orders Received/Actions taken: Consistent with previous results.

## 2020-02-02 NOTE — Progress Notes (Signed)
Nutrition Brief Note  Patient identified on the malnutrition screening tool.   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   Pt is s/p palliative radiation and started on palliative chemotherapy which he did not tolerate. Patient last saw oncology on 4/13, decision made to transition to hospice. Patient recently declined in home services preferring to care for himself. Patient has experienced increasing weakness and fall at home resulting in right fourth rib fracture, mildly displaced ninth rib fracture on PCA pump. Plans to [facility] for hosp[facility] services once bed is available.    Patient on regular diet, will provide Ensure supplement. No further nutrition interventions warranted at this time.   Please consult as needed.   Lajuan Lines, RD, LDN Clinical Nutrition After Hours/Weekend Pager # in [place]

## 2020-02-02 NOTE — Progress Notes (Signed)
   02/02/20 1305  Assess: MEWS Score  Temp 97.9 F (36.6 C)  BP 139/83  Pulse Rate (!) 112  Resp (!) 30  Level of Consciousness Alert  SpO2 98 %  O2 Device Room Air  Assess: MEWS Score  MEWS Temp 0  MEWS Systolic 0  MEWS Pulse 2  MEWS RR 2  MEWS LOC 0  MEWS Score 4  MEWS Score Color Red  Assess: if the MEWS score is Yellow or Red  Were vital signs taken at a resting state? Yes  Focused Assessment Documented focused assessment  Early Detection of Sepsis Score *See Row Information* Medium  MEWS guidelines implemented *See Row Information* Yes  Treat  MEWS Interventions Administered scheduled meds/treatments  Take Vital Signs  Increase Vital Sign Frequency  Red: Q 1hr X 4 then Q 4hr X 4, if remains red, continue Q 4hrs  Escalate  MEWS: Escalate Red: discuss with charge nurse/RN and provider, consider discussing with RRT  Notify: Charge Nurse/RN  Name of Charge Nurse/RN Notified Daeja RN   Date Charge Nurse/RN Notified 02/02/20  Time Charge Nurse/RN Notified 7209  Notify: Provider  Provider Name/Title Neysa Bonito  Date Provider Notified 02/02/20  Time Provider Notified 4709  Notification Type Page  Notification Reason Change in status   MEWS Guidelines - (patients age 49 and over)    Pt RR and HR eleveated, triggering RED MEWS protocol. Pt with c/o pain for which PCA is in use. No distress noted. MD paged, charge RN aware. Will continue to monitor.

## 2020-02-02 NOTE — Progress Notes (Signed)
[facility] ([facility])  Met with patient and his SO Almyra Free at the bedside.  Pt somnolent and appears to be comfortable.  Per Almyra Free, they were engaged by hospice previously but refused to be admitted due to his substance abuse.  They would like to see if he is eligible for [facility].  Advised her we do not currently have any beds open, but we would place him on the list and see if he is available.  Thank you, Venia Carbon RN, BSN, [facility] Liaison

## 2020-02-02 NOTE — Progress Notes (Addendum)
   02/02/20 8828  Assess: MEWS Score  Temp 98.9 F (37.2 C)  BP 133/77  Pulse Rate (!) 114  Resp (!) 21  Level of Consciousness Alert  SpO2 97 %  O2 Device Room Air  Assess: MEWS Score  MEWS Temp 0  MEWS Systolic 0  MEWS Pulse 2  MEWS RR 1  MEWS LOC 0  MEWS Score 3  MEWS Score Color Yellow  Assess: if the MEWS score is Yellow or Red  Were vital signs taken at a resting state? Yes  Focused Assessment Documented focused assessment  Early Detection of Sepsis Score *See Row Information* Medium  MEWS guidelines implemented *See Row Information* Yes  Treat  MEWS Interventions Administered prn meds/treatments  Take Vital Signs  Increase Vital Sign Frequency  Yellow: Q 2hr X 2 then Q 4hr X 2, if remains yellow, continue Q 4hrs  Escalate  MEWS: Escalate Yellow: discuss with charge nurse/RN and consider discussing with provider and RRT  Notify: Charge Nurse/RN  Name of Charge Nurse/RN Notified Daeja RN   Date Charge Nurse/RN Notified 02/02/20  Time Charge Nurse/RN Notified 0830  Document  Patient Outcome Stabilized after interventions  Progress note created (see row info) Yes   HR elevated r/t pain, triggering yellow MEWS protocol. Dilaudid PCA initiated for pain control. Pt now resting comfortably. Will continue to monitor.  MEWS Guidelines - (patients age 87 and over)

## 2020-02-02 NOTE — Progress Notes (Addendum)
   02/02/20 1205  Assess: MEWS Score  Temp 98.2 F (36.8 C)  BP 130/90  Pulse Rate (!) 109  Resp (!) 24  Level of Consciousness Alert  SpO2 97 %  O2 Device Room Air  Assess: MEWS Score  MEWS Temp 0  MEWS Systolic 0  MEWS Pulse 1  MEWS RR 1  MEWS LOC 0  MEWS Score 2  MEWS Score Color Yellow  Assess: if the MEWS score is Yellow or Red  Were vital signs taken at a resting state? Yes  Focused Assessment Documented focused assessment  Early Detection of Sepsis Score *See Row Information* Medium  MEWS guidelines implemented *See Row Information* No, previously yellow, continue vital signs every 4 hours  Treat  MEWS Interventions Administered scheduled meds/treatments;Administered prn meds/treatments  Take Vital Signs  Increase Vital Sign Frequency  Yellow: Q 2hr X 2 then Q 4hr X 2, if remains yellow, continue Q 4hrs  Escalate  MEWS: Escalate Yellow: discuss with charge nurse/RN and consider discussing with provider and RRT  Notify: Charge Nurse/RN  Name of Charge Nurse/RN Notified Daeja RN  Date Charge Nurse/RN Notified 02/02/20  Time Charge Nurse/RN Notified 2440  Document  Patient Outcome Stabilized after interventions  Progress note created (see row info) Yes   Pt's RR and HR still slightly elevated, triggering YELLOW MEWS protocol. Pt previously yellow, will continue to monitor per protocol. Pt sleeping, resting comfortably.

## 2020-02-02 NOTE — Progress Notes (Deleted)
MEWS Guidelines - (patients age 49 and over)  Pt's RR and HR still slightly elevated, triggering YELLOW MEWS protocol. Pt previously yellow, will continue to monitor per protocol. Pt sleeping, resting comfortably.

## 2020-02-02 NOTE — Progress Notes (Signed)
PROGRESS NOTE    Nicolas Welch    Code Status: DNR  PYK:[id number] DOB: 1971/01/06 DOA: 02/01/2020 LOS: 1 days  PCP: Patient, No Pcp Per CC:  Chief Complaint  Patient presents with  . Fall  . 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  ED Course: labs, imaging, pain control, potassium, 500 cc NS. EDP spoke w/ hospice nurse who says a bed should be available @ [place] in the next couple of days.  Also with hypercalcemia of malignancy and started on IV fluids  5/29: Placed on PCA pump  A & P   Principal Problem:   Cancer associated pain Active Problems:   Non-small cell carcinoma of right lung, stage 4 (HCC)   Hypercalcemia of malignancy   Hypokalemia   1. Small cell and non-small cell lung cancer with mets to brain and spine with cancer related pain a. Diagnosed in January of this year s/p palliative radiation and chemotherapy which she did not tolerate b. Follows with [facility] c. Plan is for [facility] for hospice services once bed available d. PCA pump  2. S/p fall from several stairs up with subsequent right fourth rib fracture and mildly displaced left ninth rib fracture a. PCA pump  3. Hypercalcemia of malignancy a. Corrected to 15.2 on admission and received IV fluids -> 14.5 corrected for albumin b. Increased IV fluids to 250 cc/h and given a dose of zoledronic acid x1 c. Repeat corrected calcium 14.2 d. Continue IV fluids and trending calcium  4. Hypomagnesemia a. Repleted via  IV  5. Hypokalemia a. Replete p.o.  6. Anxiety a. Ativan every 6 hours as needed  7. Normocytic anemia  8. SIRS secondary to pain and hypercalcemia a. Continue treatment as above   DVT prophylaxis: Lovenox Family Communication: Patient's significant other at bedside has been updated  Disposition Plan:  Status is: Inpatient  Remains inpatient appropriate because:Ongoing active pain requiring inpatient pain management, Unsafe d/c plan and IV treatments appropriate due to intensity of illness or inability to take PO   Dispo: The patient is from: Home              Anticipated d/c is to: [facility] when bed available              Anticipated d/c date is: 2 days              Patient currently is not medically stable to d/c.     Pressure injury documentation    None  Consultants  Palliative  Procedures  None  Antibiotics   Anti-infectives (From admission, onward)   None        Subjective   Severe pain despite fentanyl patch and as needed injection this a.m. as well as anxiety throughout the day.  Pain/anxiety better controlled with Dilaudid PCA pump and Ativan.  No other issues  Objective   Vitals:   02/02/20 1103 02/02/20 1154 02/02/20 1205 02/02/20 1305  BP: 119/81  130/90 139/83  Pulse: (!) 110  (!) 109 (!) 112  Resp: (!) 28 (!) 23 (!) 24 (!) 30  Temp: 98.6 F (37 C)  98.2 F (36.8 C) 97.9 F (36.6 C)  TempSrc: Oral  Oral Oral  SpO2: 96% 97% 97% 98%  Weight:      Height:        Intake/Output Summary (Last 24 hours) at 02/02/2020 1606 Last data filed at 02/02/2020 0800 Gross per 24 hour  Intake 200 ml  Output 1600 ml  Net -1400 ml   Filed Weights   02/01/20 2044  Weight: 60.8 kg    Examination:  Physical Exam Vitals and nursing note reviewed. Exam conducted with a chaperone present.  Constitutional:      Comments: Appears in significant pain this am Appears older than stated age  HENT:     Head:     Comments: Temporal wasting     Mouth/Throat:     Mouth: Mucous membranes are moist.  Eyes:     Conjunctiva/sclera: Conjunctivae normal.  Cardiovascular:     Rate and Rhythm: Regular rhythm. Tachycardia present.  Pulmonary:     Effort: Pulmonary effort is normal.     Breath sounds: No wheezing.  Abdominal:     Tenderness: There is abdominal tenderness.  Musculoskeletal:        General: Tenderness present.  Skin:    Coloration: Skin is not jaundiced or pale.  Neurological:     Mental Status: He is alert.  Psychiatric:        Mood and Affect: Mood is anxious.     Data Reviewed: I have personally reviewed following labs and imaging studies  CBC: Recent Labs  Lab 02/01/20 1433  WBC 13.4*  NEUTROABS 12.1*  HGB 8.5*  HCT 26.5*  MCV 91.4  PLT 562   Basic Metabolic Panel: Recent Labs  Lab 02/01/20 1433 02/02/20 0539 02/02/20 0726 02/02/20 1118  NA 134* 132*  --  134*  K 2.7* 3.2*  --  3.2*  CL 95* 99  --  101  CO2 29 23  --  25  GLUCOSE 93 84  --  100*  BUN 24* 20  --  19  CREATININE 0.98 0.98  --  0.95  CALCIUM 13.9* 13.2*  --  12.8*  MG 1.5*  --  1.2*  --    GFR: Estimated Creatinine Clearance: 81.8 mL/min (by C-G formula based on SCr of 0.95 mg/dL). Liver Function Tests: Recent Labs  Lab 02/01/20 1433 02/02/20 0539 02/02/20 1118  AST 35 31 30  ALT 19 18 18   ALKPHOS 137* 127* 130*  BILITOT 0.8 0.7 0.6  PROT 7.0 6.7 6.6  ALBUMIN 2.4* 2.4* 2.3*   No results for input(s): LIPASE, AMYLASE in the last 168 hours. No results for input(s): AMMONIA in the last 168 hours. Coagulation Profile: No results for input(s): INR, PROTIME in the last 168 hours. Cardiac Enzymes: No results for input(s): CKTOTAL, CKMB, CKMBINDEX, TROPONINI in the last 168 hours. BNP (last 3 results) No results for input(s): PROBNP in the last 8760 hours. HbA1C: No results for input(s): HGBA1C in the last 72 hours. CBG: No results for input(s): GLUCAP in the last 168 hours. Lipid Profile: No results for input(s):  CHOL, HDL, LDLCALC, TRIG, CHOLHDL, LDLDIRECT in the last 72 hours. Thyroid Function Tests: No results for input(s): TSH, T4TOTAL, FREET4, T3FREE, THYROIDAB in the last 72 hours. Anemia Panel: No results for input(s): VITAMINB12, FOLATE, FERRITIN, TIBC, IRON, RETICCTPCT in the last 72 hours. Sepsis Labs: No results for input(s): PROCALCITON, LATICACIDVEN in  the last 168 hours.  Recent Results (from the past 240 hour(s))  SARS Coronavirus 2 by RT PCR (hospital order, performed in [facility] hospital lab) Nasopharyngeal Nasopharyngeal Swab     Status: None   Collection Time: 02/01/20  6:28 PM   Specimen: Nasopharyngeal Swab  Result Value Ref Range Status   SARS Coronavirus 2 NEGATIVE NEGATIVE Final    Comment: (NOTE) SARS-CoV-2 target nucleic acids are NOT DETECTED. The SARS-CoV-2 RNA is generally detectable in upper and lower respiratory specimens during the acute phase of infection. The lowest concentration of SARS-CoV-2 viral copies this assay can detect is 250 copies / mL. A negative result does not preclude SARS-CoV-2 infection and should not be used as the sole basis for treatment or other patient management decisions.  A negative result may occur with improper specimen collection / handling, submission of specimen other than nasopharyngeal swab, presence of viral mutation(s) within the areas targeted by this assay, and inadequate number of viral copies (<250 copies / mL). A negative result must be combined with clinical observations, patient history, and epidemiological information. Fact Sheet for Patients:   StrictlyIdeas.no Fact Sheet for Healthcare Providers: BankingDealers.co.za This test is not yet approved or cleared  by the [place] FDA and has been authorized for detection and/or diagnosis of SARS-CoV-2 by FDA under an Emergency Use Authorization (EUA).  This EUA will remain in effect (meaning this test can be used) for  the duration of the COVID-19 declaration under Section 564(b)(1) of the Act, 21 U.S.C. section 360bbb-3(b)(1), unless the authorization is terminated or revoked sooner. Performed at [facility], [place] [address], [place] [address]          Radiology Studies: DG Chest 2 View  Result Date: 02/01/2020 CLINICAL DATA:  Diffuse pain following a fall from a porch 1 day ago. Metastatic lung cancer. EXAM: CHEST - 2 VIEW COMPARISON:  Chest, abdomen and pelvis CT dated 12/14/2019. FINDINGS: Increased ill-defined densities in both lungs with areas of nodularity. Interval displaced right posterior 4th rib fracture. Mild cortical irregularity in the inferior distal right clavicle without significant change. Lytic metastasis in the lateral aspect of the right 1st rib. Lytic destruction of the majority of the left 8th posterior rib and mildly displaced fracture of the left posterior 9th rib in an area of lytic destruction. No pneumothorax. Normal sized heart. IMPRESSION: 1. Interval displaced right posterior 4th rib fracture and mildly displaced left posterior 9th rib fracture without pneumothorax. 2. Progressive pulmonary, pleural and bony metastatic disease. Electronically Signed   By: Claudie Revering M.D.   On: 02/01/2020 16:05   DG Lumbar Spine 2-3 Views  Result Date: 02/01/2020 CLINICAL DATA:  Low back pain following a fall from a porch yesterday. EXAM: LUMBAR SPINE - 2-3 VIEW COMPARISON:  Abdomen and pelvis CT dated 12/14/2019 FINDINGS: Multiple lytic lesions in the lumbar spine are better visualized on the previous CT images. There has been no significant change in a mildly comminuted fracture of the L3 vertebral body on the right with mild compression. A pathological fracture of the superior aspect of the L5 vertebral body is again demonstrated. No acute fractures or subluxations seen. Atheromatous arterial calcifications. IMPRESSION: 1. No acute fracture or subluxation. 2. Stable  mildly comminuted pathological fracture of the L3 vertebral body on the right with mild compression. 3. A mild superior endplate pathological fracture of the L5 vertebra is again demonstrated. Electronically Signed   By: Claudie Revering M.D.   On: 02/01/2020  16:00   CT Head Wo Contrast  Result Date: 02/01/2020 CLINICAL DATA:  Acute pain due to trauma EXAM: CT HEAD WITHOUT CONTRAST CT CERVICAL SPINE WITHOUT CONTRAST TECHNIQUE: Multidetector CT imaging of the head and cervical spine was performed following the standard protocol without intravenous contrast. Multiplanar CT image reconstructions of the cervical spine were also generated. COMPARISON:  MRI dated December 02, 2019.  PET-CT dated 11/02/2019 FINDINGS: CT HEAD FINDINGS Brain: No evidence of acute infarction, hemorrhage, hydrocephalus, extra-axial collection or mass lesion/mass effect. Vascular: No hyperdense vessel or unexpected calcification. Skull: Again noted is a soft tissue mass centered in the left jugular foramen resulting in adjacent osseous destruction. This has progressed since the patient's prior PET-CT dated 11/02/2019 Sinuses/Orbits: The paranasal sinuses are essentially clear. There is opacification of the left mastoid air cells. The right mastoid air cells are clear. Other: None. CT CERVICAL SPINE FINDINGS Alignment: Normal. Skull base and vertebrae: Multiple lytic lesions are noted, specifically involving the C7, T1, T2, and T3 vertebral bodies. There is no definite evidence for an acute fracture. There is a lytic lesion involving the right first rib as well as the left second rib. Soft tissues and spinal canal: No prevertebral fluid or swelling. No visible canal hematoma. There is no significant Disc levels: Mild multilevel disc height loss is noted throughout the cervical spine. Upper chest: Negative. Other: None IMPRESSION: 1. No acute traumatic abnormality. 2. Again identified are metastatic lytic lesions involving the skull base and  cervical spine. These have significantly progressed since the patient's prior imaging. Electronically Signed   By: Constance Holster M.D.   On: 02/01/2020 15:55   CT Cervical Spine Wo Contrast  Result Date: 02/01/2020 CLINICAL DATA:  Acute pain due to trauma EXAM: CT HEAD WITHOUT CONTRAST CT CERVICAL SPINE WITHOUT CONTRAST TECHNIQUE: Multidetector CT imaging of the head and cervical spine was performed following the standard protocol without intravenous contrast. Multiplanar CT image reconstructions of the cervical spine were also generated. COMPARISON:  MRI dated December 02, 2019.  PET-CT dated 11/02/2019 FINDINGS: CT HEAD FINDINGS Brain: No evidence of acute infarction, hemorrhage, hydrocephalus, extra-axial collection or mass lesion/mass effect. Vascular: No hyperdense vessel or unexpected calcification. Skull: Again noted is a soft tissue mass centered in the left jugular foramen resulting in adjacent osseous destruction. This has progressed since the patient's prior PET-CT dated 11/02/2019 Sinuses/Orbits: The paranasal sinuses are essentially clear. There is opacification of the left mastoid air cells. The right mastoid air cells are clear. Other: None. CT CERVICAL SPINE FINDINGS Alignment: Normal. Skull base and vertebrae: Multiple lytic lesions are noted, specifically involving the C7, T1, T2, and T3 vertebral bodies. There is no definite evidence for an acute fracture. There is a lytic lesion involving the right first rib as well as the left second rib. Soft tissues and spinal canal: No prevertebral fluid or swelling. No visible canal hematoma. There is no significant Disc levels: Mild multilevel disc height loss is noted throughout the cervical spine. Upper chest: Negative. Other: None IMPRESSION: 1. No acute traumatic abnormality. 2. Again identified are metastatic lytic lesions involving the skull base and cervical spine. These have significantly progressed since the patient's prior imaging.  Electronically Signed   By: Constance Holster M.D.   On: 02/01/2020 15:55        Scheduled Meds: . enoxaparin (LOVENOX) injection  40 mg Subcutaneous Q24H  . HYDROmorphone   Intravenous Q4H  . nicotine  14 mg Transdermal Daily   Continuous Infusions: . sodium chloride  250 mL/hr at 02/02/20 1433     Time spent: 30  minutes with over 50% of the time coordinating the patient's care    Harold Hedge, DO [facility] Pager [phone]  Call night coverage person covering after 7pm

## 2020-02-03 ENCOUNTER — Inpatient Hospital Stay (HOSPITAL_COMMUNITY)
Admission: EM | Admit: 2020-02-03 | Discharge: 2020-02-06 | DRG: 951 | Disposition: A | Discharge status: Hospice | Payer: Medicaid Other | Attending: Family Medicine | Admitting: Family Medicine

## 2020-02-03 ENCOUNTER — Encounter (HOSPITAL_COMMUNITY): Payer: Self-pay

## 2020-02-03 DIAGNOSIS — M549 Dorsalgia, unspecified: Secondary | ICD-10-CM

## 2020-02-03 DIAGNOSIS — Z515 Encounter for palliative care: Principal | ICD-10-CM

## 2020-02-03 DIAGNOSIS — C7931 Secondary malignant neoplasm of brain: Secondary | ICD-10-CM | POA: Diagnosis present

## 2020-02-03 DIAGNOSIS — K219 Gastro-esophageal reflux disease without esophagitis: Secondary | ICD-10-CM | POA: Diagnosis present

## 2020-02-03 DIAGNOSIS — F319 Bipolar disorder, unspecified: Secondary | ICD-10-CM | POA: Diagnosis present

## 2020-02-03 DIAGNOSIS — R64 Cachexia: Secondary | ICD-10-CM | POA: Diagnosis present

## 2020-02-03 DIAGNOSIS — Z79899 Other long term (current) drug therapy: Secondary | ICD-10-CM

## 2020-02-03 DIAGNOSIS — W109XXA Fall (on) (from) unspecified stairs and steps, initial encounter: Secondary | ICD-10-CM | POA: Diagnosis present

## 2020-02-03 DIAGNOSIS — G9341 Metabolic encephalopathy: Secondary | ICD-10-CM

## 2020-02-03 DIAGNOSIS — F419 Anxiety disorder, unspecified: Secondary | ICD-10-CM | POA: Diagnosis not present

## 2020-02-03 DIAGNOSIS — C7951 Secondary malignant neoplasm of bone: Secondary | ICD-10-CM | POA: Diagnosis present

## 2020-02-03 DIAGNOSIS — C3491 Malignant neoplasm of unspecified part of right bronchus or lung: Secondary | ICD-10-CM | POA: Diagnosis present

## 2020-02-03 DIAGNOSIS — S2249XA Multiple fractures of ribs, unspecified side, initial encounter for closed fracture: Secondary | ICD-10-CM

## 2020-02-03 DIAGNOSIS — Z7189 Other specified counseling: Secondary | ICD-10-CM

## 2020-02-03 DIAGNOSIS — Z9221 Personal history of antineoplastic chemotherapy: Secondary | ICD-10-CM

## 2020-02-03 DIAGNOSIS — K5641 Fecal impaction: Secondary | ICD-10-CM | POA: Diagnosis present

## 2020-02-03 DIAGNOSIS — S2243XA Multiple fractures of ribs, bilateral, initial encounter for closed fracture: Secondary | ICD-10-CM | POA: Diagnosis present

## 2020-02-03 DIAGNOSIS — Z66 Do not resuscitate: Secondary | ICD-10-CM | POA: Diagnosis present

## 2020-02-03 DIAGNOSIS — C787 Secondary malignant neoplasm of liver and intrahepatic bile duct: Secondary | ICD-10-CM | POA: Diagnosis present

## 2020-02-03 DIAGNOSIS — R54 Age-related physical debility: Secondary | ICD-10-CM | POA: Diagnosis present

## 2020-02-03 DIAGNOSIS — E876 Hypokalemia: Secondary | ICD-10-CM | POA: Diagnosis present

## 2020-02-03 DIAGNOSIS — G4700 Insomnia, unspecified: Secondary | ICD-10-CM | POA: Diagnosis present

## 2020-02-03 DIAGNOSIS — Z8249 Family history of ischemic heart disease and other diseases of the circulatory system: Secondary | ICD-10-CM

## 2020-02-03 DIAGNOSIS — Z7951 Long term (current) use of inhaled steroids: Secondary | ICD-10-CM

## 2020-02-03 DIAGNOSIS — F1110 Opioid abuse, uncomplicated: Secondary | ICD-10-CM | POA: Diagnosis present

## 2020-02-03 DIAGNOSIS — Z681 Body mass index (BMI) 19 or less, adult: Secondary | ICD-10-CM

## 2020-02-03 DIAGNOSIS — E46 Unspecified protein-calorie malnutrition: Secondary | ICD-10-CM | POA: Diagnosis present

## 2020-02-03 DIAGNOSIS — F17210 Nicotine dependence, cigarettes, uncomplicated: Secondary | ICD-10-CM | POA: Diagnosis present

## 2020-02-03 DIAGNOSIS — Z923 Personal history of irradiation: Secondary | ICD-10-CM

## 2020-02-03 DIAGNOSIS — G893 Neoplasm related pain (acute) (chronic): Secondary | ICD-10-CM | POA: Diagnosis present

## 2020-02-03 DIAGNOSIS — Z20822 Contact with and (suspected) exposure to covid-19: Secondary | ICD-10-CM | POA: Diagnosis present

## 2020-02-03 LAB — COMPREHENSIVE METABOLIC PANEL
ALT: 18 U/L (ref 0–44)
ALT: 17 U/L (ref 0–44)
AST: 32 U/L (ref 15–41)
AST: 28 U/L (ref 15–41)
Albumin: 2.4 g/dL — ABNORMAL LOW (ref 3.5–5.0)
Albumin: 2.3 g/dL — ABNORMAL LOW (ref 3.5–5.0)
Alkaline Phosphatase: 136 U/L — ABNORMAL HIGH (ref 38–126)
Alkaline Phosphatase: 123 U/L (ref 38–126)
Anion gap: 10 (ref 5–15)
Anion gap: 10 (ref 5–15)
BUN: 16 mg/dL (ref 6–20)
BUN: 15 mg/dL (ref 6–20)
CO2: 23 mmol/L (ref 22–32)
CO2: 21 mmol/L — ABNORMAL LOW (ref 22–32)
Calcium: 12.8 mg/dL — ABNORMAL HIGH (ref 8.9–10.3)
Calcium: 12 mg/dL — ABNORMAL HIGH (ref 8.9–10.3)
Chloride: 103 mmol/L (ref 98–111)
Chloride: 103 mmol/L (ref 98–111)
Creatinine, Ser: 0.82 mg/dL (ref 0.61–1.24)
Creatinine, Ser: 0.88 mg/dL (ref 0.61–1.24)
GFR calc Af Amer: >60 mL/min (ref >60)
GFR calc Af Amer: >60 mL/min (ref >60)
GFR calc non Af Amer: >60 mL/min (ref >60)
GFR calc non Af Amer: >60 mL/min (ref >60)
Glucose, Bld: 102 mg/dL — ABNORMAL HIGH (ref 70–99)
Glucose, Bld: 96 mg/dL (ref 70–99)
Potassium: 3.7 mmol/L (ref 3.5–5.1)
Potassium: 3.4 mmol/L — ABNORMAL LOW (ref 3.5–5.1)
Sodium: 136 mmol/L (ref 135–145)
Sodium: 134 mmol/L — ABNORMAL LOW (ref 135–145)
Total Bilirubin: 0.8 mg/dL (ref 0.3–1.2)
Total Bilirubin: 0.9 mg/dL (ref 0.3–1.2)
Total Protein: 7.1 g/dL (ref 6.5–8.1)
Total Protein: 6.8 g/dL (ref 6.5–8.1)

## 2020-02-03 LAB — MAGNESIUM: Magnesium: 2 mg/dL (ref 1.7–2.4)

## 2020-02-03 MED ORDER — FLUTICASONE-UMECLIDIN-VILANT 100-62.5-25 MCG/INH IN AEPB: 1.0000 | INHALATION_SPRAY | Freq: Every day | RESPIRATORY_TRACT | Status: DC

## 2020-02-03 MED ORDER — HYDROMORPHONE HCL 1 MG/ML IJ SOLN
1.0000 mg | Freq: Once | INTRAMUSCULAR | Status: AC
  Administered 2020-02-03: 1 mg via INTRAVENOUS
  Filled 2020-02-03: qty 1

## 2020-02-03 MED ORDER — OXYCODONE HCL 5 MG PO TABS
10.0000 mg | ORAL_TABLET | ORAL | Status: DC | PRN
Start: 2020-02-03 — End: 2020-02-03

## 2020-02-03 MED ORDER — DEXMEDETOMIDINE HCL IN NACL 200 MCG/50ML IV SOLN: 0.2000 ug/kg/h | INTRAVENOUS | Status: DC

## 2020-02-03 MED ORDER — GLYCOPYRROLATE 1 MG PO TABS: 1.0000 mg | ORAL_TABLET | ORAL | Status: DC | PRN

## 2020-02-03 MED ORDER — FENTANYL 75 MCG/HR TD PT72
1.0000 | MEDICATED_PATCH | TRANSDERMAL | Status: DC
  Administered 2020-02-04: 1 via TRANSDERMAL
  Filled 2020-02-03: qty 1

## 2020-02-03 MED ORDER — KETOROLAC TROMETHAMINE 30 MG/ML IJ SOLN: 30.0000 mg | Freq: Four times a day (QID) | INTRAMUSCULAR | Status: AC | PRN

## 2020-02-03 MED ORDER — ACETAMINOPHEN 650 MG RE SUPP: 650.0000 mg | Freq: Four times a day (QID) | RECTAL | Status: DC | PRN

## 2020-02-03 MED ORDER — ENOXAPARIN SODIUM 40 MG/0.4ML ~~LOC~~ SOLN: 40.0000 mg | SUBCUTANEOUS | Status: DC

## 2020-02-03 MED ORDER — ACETAMINOPHEN 325 MG PO TABS: 650.0000 mg | ORAL_TABLET | Freq: Four times a day (QID) | ORAL | Status: DC | PRN

## 2020-02-03 MED ORDER — SODIUM CHLORIDE 0.9 % IV SOLN: INTRAVENOUS | Status: DC

## 2020-02-03 MED ORDER — ONDANSETRON HCL 4 MG PO TABS: 4.0000 mg | ORAL_TABLET | Freq: Four times a day (QID) | ORAL | Status: DC | PRN

## 2020-02-03 MED ORDER — NORTRIPTYLINE HCL 10 MG PO CAPS
10.0000 mg | ORAL_CAPSULE | Freq: Every day | ORAL | Status: DC
  Administered 2020-02-04: 10 mg via ORAL
  Filled 2020-02-03: qty 1

## 2020-02-03 MED ORDER — ONDANSETRON 4 MG PO TBDP: 4.0000 mg | ORAL_TABLET | Freq: Four times a day (QID) | ORAL | Status: DC | PRN

## 2020-02-03 MED ORDER — DIPHENHYDRAMINE HCL 50 MG/ML IJ SOLN: 12.5000 mg | Freq: Four times a day (QID) | INTRAMUSCULAR | Status: DC | PRN

## 2020-02-03 MED ORDER — ONDANSETRON HCL 4 MG/2ML IJ SOLN: 4.0000 mg | Freq: Four times a day (QID) | INTRAMUSCULAR | Status: DC | PRN

## 2020-02-03 MED ORDER — GLYCOPYRROLATE 0.2 MG/ML IJ SOLN: 0.2000 mg | INTRAMUSCULAR | Status: DC | PRN

## 2020-02-03 MED ORDER — DIPHENHYDRAMINE HCL 12.5 MG/5ML PO ELIX
12.5000 mg | ORAL_SOLUTION | Freq: Four times a day (QID) | ORAL | Status: DC | PRN
Start: 2020-02-03 — End: 2020-02-04

## 2020-02-03 MED ORDER — KETOROLAC TROMETHAMINE 15 MG/ML IJ SOLN
15.0000 mg | Freq: Once | INTRAMUSCULAR | Status: AC
  Administered 2020-02-03: 15 mg via INTRAVENOUS
  Filled 2020-02-03: qty 1

## 2020-02-03 MED ORDER — POTASSIUM CHLORIDE IN NACL 20-0.9 MEQ/L-% IV SOLN
INTRAVENOUS | Status: DC
  Filled 2020-02-03: qty 1000

## 2020-02-03 MED ORDER — FENTANYL 100 MCG/HR TD PT72: 1.0000 | MEDICATED_PATCH | TRANSDERMAL | Status: DC

## 2020-02-03 MED ORDER — SODIUM CHLORIDE 0.9% FLUSH
3.0000 mL | Freq: Two times a day (BID) | INTRAVENOUS | Status: DC
  Administered 2020-02-03: 3 mL via INTRAVENOUS

## 2020-02-03 MED ORDER — HYDROMORPHONE BOLUS VIA INFUSION: 0.5000 mg | INTRAVENOUS | Status: DC | PRN

## 2020-02-03 MED ORDER — SODIUM CHLORIDE 0.9 % IV SOLN
0.5000 mg/h | INTRAVENOUS | Status: DC
  Administered 2020-02-03: 0.5 mg/h via INTRAVENOUS
  Filled 2020-02-03: qty 2.5

## 2020-02-03 MED ORDER — SODIUM CHLORIDE 0.9% FLUSH: 3.0000 mL | INTRAVENOUS | Status: DC | PRN

## 2020-02-03 MED ORDER — HYDROMORPHONE BOLUS VIA INFUSION
0.5000 mg | INTRAVENOUS | Status: DC | PRN
  Administered 2020-02-03 (×2): 1 mg via INTRAVENOUS
  Filled 2020-02-03: qty 1

## 2020-02-03 MED ORDER — HYDROMORPHONE 1 MG/ML IV SOLN
INTRAVENOUS | Status: DC
Start: 2020-02-04 — End: 2020-02-04

## 2020-02-03 MED ORDER — POLYVINYL ALCOHOL 1.4 % OP SOLN
1.0000 [drp] | Freq: Four times a day (QID) | OPHTHALMIC | Status: DC | PRN
  Filled 2020-02-03: qty 15

## 2020-02-03 MED ORDER — NALOXONE HCL 0.4 MG/ML IJ SOLN: 0.4000 mg | INTRAMUSCULAR | Status: DC | PRN

## 2020-02-03 MED ORDER — SODIUM CHLORIDE 0.9% FLUSH
9.0000 mL | INTRAVENOUS | Status: DC | PRN
Start: 2020-02-03 — End: 2020-02-04

## 2020-02-03 MED ORDER — SODIUM CHLORIDE 0.9 % IV SOLN: 250.0000 mL | INTRAVENOUS | Status: DC | PRN

## 2020-02-03 MED ORDER — OLANZAPINE 5 MG PO TABS: 2.5000 mg | ORAL_TABLET | Freq: Every day | ORAL | Status: DC

## 2020-02-03 MED ORDER — HALOPERIDOL LACTATE 5 MG/ML IJ SOLN
2.0000 mg | Freq: Four times a day (QID) | INTRAMUSCULAR | Status: DC | PRN
  Administered 2020-02-03: 2 mg via INTRAVENOUS
  Filled 2020-02-03: qty 1

## 2020-02-03 MED ORDER — HYDROMORPHONE BOLUS VIA INFUSION
1.0000 mg | INTRAVENOUS | Status: DC | PRN
  Administered 2020-02-03: 2 mg via INTRAVENOUS
  Filled 2020-02-03: qty 2

## 2020-02-03 NOTE — Progress Notes (Signed)
Patient left AMA. Per Dr. Neysa Bonito, patient expressed "full capacity to make a decision to leave AMA". He was escorted out via wheelchair by his friend Aaron Edelman. I educated Aaron Edelman to bring patient back to Emergency Department if patient needed to be re-admitted.Nicolas Welch

## 2020-02-03 NOTE — ED Triage Notes (Addendum)
Pt reports generalized cancer pain. He left AMA from 6th floor admission for end of life care less than 2 hours ago. He returned because the pain was too bad.

## 2020-02-03 NOTE — H&P (Signed)
History and Physical    Nicolas Welch YKD:[id number] DOB: Sep 17, 1970 DOA: 02/03/2020  PCP: Patient, No Pcp Per   Patient coming from: Home.  I have personally briefly reviewed patient's old medical records in [place]  Chief Complaint: Back pain.  HPI: Nicolas Welch is a 49 y.o. 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 Neysa Bonito for further details).  However, after he went home he had increased pain.  He confessed to doing a bowl of fentanyl IV due to pain, which helped temporarily, but he decided to return to the hospital because his pain was getting out of control.  ED Course: Initial vital signs temperature 97.9 F, pulse 111, respirations 27, blood pressure 135/93 mmHg and O2 sat 100% on room air.  Patient was given 1 mg of hydromorphone in the emergency department.  The patient was initially somnolent, but by the time I examined him he was fully alert and oriented.  No new labs were drawn.  Patient stated he wanted to be comfort care only.  Review of Systems: As per HPI otherwise all other systems reviewed and are negative.  Past Medical History:  Diagnosis Date  . Anginal pain ([place])   . Bipolar disorder ([place])   . Cancer ([place])     " lung and metastatic bone cancer "  . Depression   . GERD (gastroesophageal reflux disease)   . Hepatitis    HEP C  . Lung mass     Past Surgical History:  Procedure Laterality Date  . APPENDECTOMY    . BRONCHIAL BIOPSY  10/12/2019   Procedure: BRONCHIAL BIOPSIES;  Surgeon: [name], DO;  Location:  [facility];  Service: Thoracic;;  . BRONCHIAL NEEDLE ASPIRATION BIOPSY  10/12/2019   Procedure: BRONCHIAL NEEDLE ASPIRATION BIOPSIES;  Surgeon: [name], DO;  Location: [facility];  Service: Thoracic;;  . BRONCHIAL WASHINGS  10/12/2019   Procedure: BRONCHIAL WASHINGS;  Surgeon: [name], DO;  Location: [facility];  Service: Thoracic;;  . TONSILLECTOMY AND ADENOIDECTOMY    . VIDEO BRONCHOSCOPY WITH ENDOBRONCHIAL ULTRASOUND N/A 10/12/2019   Procedure: VIDEO BRONCHOSCOPY WITH ENDOBRONCHIAL ULTRASOUND;  Surgeon: [name], DO;  Location: [facility];  Service: Thoracic;  Laterality: N/A;  . WISDOM TOOTH EXTRACTION      Social History  reports that he has been smoking cigarettes. He has a 30.00 pack-year smoking history. He has never used smokeless tobacco. He reports current drug use. Drugs: Cocaine, IV, Marijuana, and Heroin. He reports that he does not drink alcohol.  Allergies  Allergen Reactions  . Egg [Eggs Or Egg-Derived Products] Hives, Itching and Swelling  . Milk-Related Compounds Hives, Itching and Swelling  . Wheat Bran Hives, Itching and Swelling   Family medical history Hypertension several relatives   Prior to Admission medications   Medication Sig Start Date End Date Taking? Authorizing Provider  acetaminophen (TYLENOL) 500 MG tablet Take 1,000 mg by mouth every 8 (eight) hours as needed for mild pain, moderate pain or headache.   Yes [provider]  fentaNYL (DURAGESIC) 100 MCG/HR Place 1  patch onto the skin every 3 (three) days.   Yes [provider]  Fluticasone-Umeclidin-Vilant (TRELEGY ELLIPTA) 100-62.5-25 MCG/INH AEPB Inhale 1 puff into the lungs daily. Patient taking differently: Inhale 1 puff into the lungs daily as needed (SOB, wheezing).  11/07/19  Yes Icard, Bradley L, DO  ibuprofen (ADVIL) 200 MG tablet Take 600-8,000 mg by mouth every 6 (six) hours as needed for headache, mild pain or moderate pain.    Yes [provider]  oxyCODONE-acetaminophen (PERCOCET) 10-325 MG tablet Take 1 tablet by mouth every 4 (four) hours as needed for pain.   Yes [provider]  albuterol (PROVENTIL) (2.5 MG/3ML) 0.083% nebulizer solution Take 3 mLs (2.5 mg total) by nebulization every 6 (six) hours as needed for wheezing or shortness of breath. Patient not taking: Reported on 12/26/2019 11/07/19   June Leap L, DO  prochlorperazine (COMPAZINE) 10 MG tablet Take 1 tablet (10 mg total) by mouth every 6 (six) hours as needed for nausea or vomiting. Patient not taking: Reported on 02/01/2020 11/26/19   Curt Bears, MD   Physical Exam: Vitals:   02/03/20 2200 02/03/20 2212 02/03/20 2300  BP: (!) 135/93 (!) 135/93 130/85  Pulse: (!) 111 (!) 103 (!) 111  Resp: (!) 27 18 (!) 32  Temp:  97.9 F (36.6 C)   TempSrc:  Oral   SpO2: 100% 98% 99%   Constitutional: Under nourished and chronically ill-appearing.   Eyes: PERRL, lids and conjunctivae normal ENMT: Mucous membranes are moist. Posterior pharynx clear of any exudate or lesions. Neck: normal, supple, no masses, no thyromegaly Respiratory: Mildly tachypneic in the low to mid 20s.  Clear to auscultation bilaterally, no wheezing, no crackles.  No accessory muscle use.  Cardiovascular: Tachycardic in the 100s and 110s, no murmurs / rubs / gallops. No extremity edema. 2+ pedal pulses. No carotid bruits.  Abdomen: Nondistended.  BS positive.  Soft, no tenderness, no masses palpated. No hepatosplenomegaly..  Musculoskeletal: no clubbing / cyanosis. Good ROM, no contractures. Normal muscle tone.  Skin: no rashes, lesions, ulcers on very limited dermatological examination. Neurologic: CN 2-12 grossly intact. Sensation intact, DTR normal. Strength 5/5 in all 4.  Psychiatric:   Alert and oriented x 3.  Anxious mood.   Labs on Admission: I have personally reviewed following labs and imaging studies  CBC: Recent Labs  Lab 02/01/20 1433  WBC 13.4*  NEUTROABS  12.1*  HGB 8.5*  HCT 26.5*  MCV 91.4  PLT 818    Basic Metabolic Panel: Recent Labs  Lab 02/01/20 1433 02/02/20 0539 02/02/20 0726 02/02/20 1118 02/02/20 1535 02/02/20 1724 02/02/20 2235 02/03/20 0223 02/03/20 0906  NA 134*   < >  --  134* 132*  --  135 136 134*  K 2.7*   < >  --  3.2* 2.8*  --  3.9 3.7 3.4*  CL 95*   < >  --  101 99  --  102 103 103  CO2 29   < >  --  25 26  --  24 23 21*  GLUCOSE 93   < >  --  100* 95  --  92 102* 96  BUN 24*   < >  --  19 18  --  15 16 15   CREATININE 0.98   < >  --  0.95 0.96  --  0.92 0.82 0.88  CALCIUM 13.9*   < >  --  12.8* 13.0*  --  13.1* 12.8* 12.0*  MG 1.5*  --  1.2*  --   --  2.7*  --  2.0  --    < > = values in this interval not displayed.    GFR: Estimated Creatinine Clearance: 88.3 mL/min (by C-G formula based on SCr of 0.88 mg/dL).  Liver Function Tests: Recent Labs  Lab 02/02/20 1118 02/02/20 1535 02/02/20 2235 02/03/20 0223 02/03/20 0906  AST 30 31 32 32 28  ALT 18 17 18 18 17   ALKPHOS 130* 133* 133* 136* 123  BILITOT 0.6 0.9 0.7 0.8 0.9  PROT 6.6 6.8 7.1 7.1 6.8  ALBUMIN 2.3* 2.3* 2.3* 2.4* 2.3*    Urine analysis: No results found for: COLORURINE, APPEARANCEUR, LABSPEC, PHURINE, GLUCOSEU, HGBUR, BILIRUBINUR, KETONESUR, PROTEINUR, UROBILINOGEN, NITRITE, LEUKOCYTESUR  Radiological Exams on Admission: No results found.  EKG: Independently reviewed.   Assessment/Plan Principal Problem:   Intractable back pain   Cancer associated pain Observation/MedSurg. He wants comfort care only. Continue fentanyl 150 mcg/hr patch every 72 hours. Resume hydromorphone PCA. Start nortriptyline 10 mg nightly for insomnia/pain. Increase nortriptyline if tolerated. Avoid benzodiazepines as they make the patient restless. Low-dose Haldol 2 mg every 6 hours as needed.  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    Hypercalcemia of  malignancy Continue IV fluids. The patient wishes to be comfort care. Further blood work as needed only..    Hypokalemia Replacing through time-limited IVF. Follow-up potassium level as needed   DVT prophylaxis: Lovenox SQ. Code Status:   DNR. Family Communication: Disposition Plan:   Patient is from:  Home.  Anticipated DC to:  TBD.  Anticipated DC date:  02/04/2020 or 02/05/2020.  Anticipated DC barriers: Clinical improvement.  Goals of care.  Consults called:  Palliative care. Admission status:  Observation/MedSurg.  Severity of Illness:  High intensity.  Reubin Milan MD [facility]  How to contact the [facility] Attending or Consulting provider [place] or covering provider during after hours [place], for this patient?   1. Check the care team in [facility] and look for a) attending/consulting TRH provider listed and b) the [facility] team listed 2. Log into [email or web address] and use [facility]'s universal password to access. If you do not have the password, please contact the hospital operator. 3. Locate the [facility] provider you are looking for under [facility] and page to a number that you can be directly reached. 4. If you still have difficulty reaching the provider, please page the [facility] (Director on Call) for the Hospitalists listed on amion for assistance.  02/03/2020, 11:57 PM   This document was prepared using Dragon voice recognition software and may contain some unintended transcription errors.

## 2020-02-03 NOTE — Consult Note (Addendum)
Consultation Note Date: 02/03/2020   Patient Name: Nicolas Welch  DOB: [birth year]  MRN: [id number]  Age / Sex: 49 y.o., male  PCP: Patient, No Pcp Per Referring Physician: Harold Hedge, MD  Reason for Consultation: Pain control and Psychosocial/spiritual support  HPI/Patient Profile: 49 y.o. 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 Family requested hospice bed and patient is currently waiting for a bed at [facility].  PMT was consulted for symptom management.  Cancer was diagnosed in January 2021.  Nicolas Welch saw Dr. Julien Nordmann.  He received palliative chemo as well as radiation for bone pain.  In Dr. Worthy Flank note of 12/18/2019 Nicolas Welch had chosen to accept Hospice services.  Clinical Assessment and Goals of Care:  I have reviewed medical records including EPIC notes, labs and imaging, received report from Dr. Neysa Bonito as well as charge RN, examined the patient and met at bedside with his s/o Nicolas Welch  to discuss diagnosis prognosis, [place], EOL wishes, disposition and options.  Dr. Neysa Bonito was present for most of the meeting.  Almyra Free explained that she has lived with Mr. Nickson for over 1 year.  He has a son and a brother who are not in close contact with him.  I requested their contact information in order to answer questions and attempt to prevent complex grief in the family  We discussed a brief life review of the patient. 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  Nicolas Welch is normally extremely pleasant and well liked.  Almyra Free describes him as a gentle soul.  Almyra Free explained to me that Nicolas Welch has severe pain from his cancer mets - the pain is everywhere particularly his spine and pelvis.  She tells me Nicolas Welch does an "8 ball" of fentanyl - he wears 3 (100 mg)  fentanyl patches at home.  Almyra Free explains that benzodiazepines cause Nicolas Welch to be demanding and agitated.    Nicolas Welch has indicated that he wants to be comfortable.  Almyra Free would like for him to remain as alert as possible but understands there is a delicate balance between his comfort and remaining alert.  Further Almyra Free and I talked about his hypercalcemia and brain tumor that are likely contributing to his altered mental status as well.  I explained comfort care to [facility] - as we focus purely on making the patient comfortable.  Then Almyra Free filled in - you don't focus on his vital signs.  I confirmed that she was correct.  I asked her if that is where we are with Nicolas Welch and she confirmed that we were.  She understands that he is dying and wants him to be comfortable.  Once again I offered to talk with other family members to ensure that they understood what was happening and answer any questions they may have so that Almyra Free did not have to carry all of that burden.  [facility] has been engaged prior to this hospitalization.  They will offer him a bed when one becomes available.  Questions and concerns were addressed.  The family was encouraged to call with questions or concerns.    Primary Decision Maker:  OTHER  Significant other.  Nicolas Welch is the only contact listed on face sheet.  She is present at bedside.     SUMMARY OF RECOMMENDATIONS     Patient is DNR and comfort care.    Per RN fentanyl patch was placed on patient yesterday and then disappeared.  Consequently will use IV dilaudid with PRN boluses instead.     Patient used approximately 12 mg of IV dilaudid in the last 18 hours.  Will start with 0.5 mg IV dilaudid gtt with PRN boluses q 30 min.  He has received bisphosphonate for hypercalcemia.  Scheduled low dose olanzapine and PRN haldol for agitation and delirium.  Avoid benzodiazepines as they appear to cause agitation in Nicolas Welch.  BP bed when available.  PMT will follow for  symptom management.  Offered family conference call multiple times.  I will continue to offer.  Code Status/Advance Care Planning:  DNR   Additional Recommendations (Limitations, Scope, Preferences):  Full Comfort Care  Palliative Prophylaxis:   Delirium Protocol  Psycho-social/Spiritual:   Desire for further Chaplaincy support: requested.  Prognosis:  Days.  Due to widely metastatic small cell and non-small cell cancer with hypercalcemia    Discharge Planning: Hospice facility      Primary Diagnoses: Present on Admission: . Non-small cell carcinoma of right lung, stage 4 ([place]) . Cancer associated pain   I have reviewed the medical record, interviewed the patient and family, and examined the patient. The following aspects are pertinent.  Past Medical History:  Diagnosis Date  . Anginal pain ([place])   . Bipolar disorder ([place])   . Cancer ([place])     " lung and metastatic bone cancer "  . Depression   . GERD (gastroesophageal reflux disease)   . Hepatitis    HEP C  . Lung mass    Social History   Socioeconomic History  . Marital status: Significant Other    Spouse name: Not on file  . Number of children: Not on file  . Years of education: Not on file  . Highest education level: Not on file  Occupational History  . Not on file  Tobacco Use  . Smoking status: Current Some Day Smoker    Packs/day: 1.00    Years: 30.00    Pack years: 30.00    Types: Cigarettes  . Smokeless tobacco: Never Used  . Tobacco comment: 1 cigarette every 2-3 days  Substance and Sexual Activity  . Alcohol use: No  . Drug use: Yes    Types: Cocaine, IV, Marijuana, Heroin    Comment: heroin; last use cocaine 10/09/19  . Sexual activity: Not Currently  Other Topics Concern  . Not on file  Social History Narrative  . Not on file   Social Determinants of Health   Financial Resource Strain:   . Difficulty of Paying Living Expenses:   Food Insecurity:   . Worried About Paediatric nurse in the Last Year:   . Arboriculturist in the Last Year:   Transportation Needs:   . Film/video editor (Medical):   Marland Kitchen Lack of Transportation (Non-Medical):   Physical Activity:   . Days of Exercise per Week:   . Minutes of Exercise per Session:  Stress:   . Feeling of Stress :   Social Connections:   . Frequency of Communication with Friends and Family:   . Frequency of Social Gatherings with Friends and Family:   . Attends Religious Services:   . Active Member of Clubs or Organizations:   . Attends Archivist Meetings:   Marland Kitchen Marital Status:    History reviewed. No pertinent family history.  Allergies  Allergen Reactions  . Egg [Eggs Or Egg-Derived Products] Hives, Itching and Swelling  . Milk-Related Compounds Hives, Itching and Swelling  . Wheat Bran Hives, Itching and Swelling      Vital Signs: BP (!) 139/96 (BP Location: Right Arm)   Pulse (!) 107   Temp 98.3 F (36.8 C) (Oral)   Resp 19   Ht 6' (1.829 m)   Wt 60.8 kg   SpO2 98%   BMI 18.18 kg/m  Pain Scale: 0-10 POSS *See Group Information*: 1-Acceptable,Awake and alert Pain Score: 5    SpO2: SpO2: 98 % O2 Device:SpO2: 98 % O2 Flow Rate: .O2 Flow Rate (L/min): 2 L/min    Palliative Assessment/Data: 30%     Time In: 10:00 Time Out: 11:30 Time Total: 90 min. Visit consisted of counseling and education dealing with the complex and emotionally intense issues surrounding the need for palliative care and symptom management in the setting of serious and potentially life-threatening illness. Greater than 50%  of this time was spent counseling and coordinating care related to the above assessment and plan.  Signed by: Florentina Jenny, PA-C Palliative Medicine  Please contact Palliative Medicine Team phone at [phone] for questions and concerns.  For individual provider: See Shea Evans

## 2020-02-03 NOTE — Progress Notes (Signed)
Patient is restless and agitated. He is pulling at tubes. He is very uncomfortable.  MD notified. He will be transferred to step down solely for a precedex drip, since he is not full comfort care yet. He will need palliative consult as soon as possible. AC and RR RN at bedside. Will give report to stepdown RN. Roderick Pee

## 2020-02-03 NOTE — ED Notes (Signed)
Pt reported to admitting MD that he "did an 8 ball of heroine today because the pain is so bad."

## 2020-02-03 NOTE — Progress Notes (Signed)
PROGRESS NOTE    Nicolas Welch    Code Status: DNR  HKV:[id number] DOB: 1971-03-28 DOA: 02/01/2020 LOS: 2 days  PCP: Patient, No Pcp Per CC:  Chief Complaint  Patient presents with  . Fall  . 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  ED Course: labs, imaging, pain control, potassium, 500 cc NS. EDP spoke w/ hospice nurse who says a bed should be available @ [place] in the next couple of days.  Also with hypercalcemia of malignancy and started on IV fluids  5/29: Placed on PCA pump.  IV fluids increased and given dose of zoledronic acid with minimal improvement in calcium.  Given a dose of calcitonin.  Overnight patient was agitated and confused likely from Ativan and all opiates and benzos discontinued 5/30: Palliative care consulted, patient transition to comfort measures  A & P   Principal Problem:   Cancer associated pain Active Problems:   Non-small cell carcinoma of right lung, stage 4 (HCC)   Hypercalcemia of malignancy   Hypokalemia   Closed fracture of multiple ribs   End of life care   Palliative care encounter   1. Small cell and non-small cell lung cancer with mets to brain and spine with cancer related pain a. Diagnosed in January of this year s/p palliative radiation and chemotherapy which she did not tolerate b. Follows with [place] was for [facility] for hospice services once bed available however continues to decline d. PCA pump and Ativan held overnight due to agitation and confusion, likely  from Ativan as patient apparently has poor reactions to this at home e. I was present during the palliative care meeting today: Comfort care with orders per palliative care  2. Metabolic encephalopathy, multifactorial: Metastatic brain cancer, opiates a. Treatment as above  3. S/p fall from several stairs up with subsequent right fourth rib fracture and mildly displaced left ninth rib fracture  4. Hypercalcemia of malignancy a. Corrected to 15.2 on admission and received IV fluids -> 14.5 corrected for albumin b. Minimal improvement with IV fluids and zoledronic acid, given calcitonin -> improved to 13.4 (corrected) c. Discontinue any further blood draws and attempts at correction due to comfort measures  5. Hypomagnesemia 6. Hypokalemia 7. Anxiety 8. Normocytic anemia 9. SIRS secondary to pain and hypercalcemia  Full comfort measures Prognosis: days  DVT prophylaxis: Lovenox Family Communication: Patient's significant other at bedside Disposition Plan:  Status is: Inpatient  Remains inpatient appropriate because:Ongoing active pain requiring inpatient pain management and comfort care   Dispo: The patient is from: Home              Anticipated d/c is to: hospital death              Anticipated d/c date is: 2 days              Patient currently is not medically stable to d/c.     Pressure injury documentation    None  Consultants  Palliative  Procedures  None  Antibiotics  Anti-infectives (From admission, onward)   None        Subjective   Overnight patient was increasingly agitated and confused.  Hospitalist discontinued PCA pump as well as Ativan and plan was for transfer to stepdown unit for Precedex drip however this was discontinued and patient remained in his room.  Palliative care consult today with family meeting at bedside and decision made for comfort measures.  Patient admits to persistent pain today but is a poor historian due to mental status  unable to give any reliable history  Objective   Vitals:   02/03/20 0506 02/03/20 0602 02/03/20 0800 02/03/20 0907  BP: 138/87 (!) 142/94 130/88 (!) 139/96  Pulse: (!) 111 (!) 113 (!) 112 (!) 107  Resp: (!) 38 (!) 40 18 19  Temp: (!) 97.4 F (36.3 C) 98.9 F (37.2 C) 98.8 F (37.1 C) 98.3 F (36.8 C)  TempSrc: Oral Oral  Oral  SpO2: 98% 99% 98% 98%  Weight:      Height:        Intake/Output Summary (Last 24 hours) at 02/03/2020 1548 Last data filed at 02/03/2020 1214 Gross per 24 hour  Intake 8219.44 ml  Output 3400 ml  Net 4819.44 ml   Filed Weights   02/01/20 2044  Weight: 60.8 kg    Examination:  Physical Exam Vitals and nursing note reviewed. Exam conducted with a chaperone present.  Constitutional:      Appearance: He is ill-appearing.     Comments: Frail cachectic male  HENT:     Head:     Comments: Temporal wasting    Mouth/Throat:     Mouth: Mucous membranes are moist.  Eyes:     Conjunctiva/sclera: Conjunctivae normal.  Cardiovascular:     Rate and Rhythm: Regular rhythm. Tachycardia present.  Pulmonary:     Comments: Tachypnea Productive cough difficulty in expectorating sputum Abdominal:     General: Abdomen is flat.     Tenderness: There is abdominal tenderness.  Musculoskeletal:        General: Tenderness present.     Right lower leg: No edema.     Left lower leg: No edema.  Skin:    Coloration: Skin is not jaundiced or pale.  Neurological:     Mental Status: He is disoriented.     Data Reviewed: I have personally reviewed following labs and imaging studies  CBC: Recent Labs  Lab 02/01/20 1433  WBC 13.4*  NEUTROABS 12.1*  HGB 8.5*  HCT 26.5*  MCV 91.4  PLT 158   Basic Metabolic Panel: Recent Labs  Lab 02/01/20 1433 02/02/20 0539 02/02/20 0726 02/02/20 1118 02/02/20 1535 02/02/20 1724 02/02/20 2235 02/03/20 0223 02/03/20 0906  NA 134*   < >  --  134* 132*  --  135 136 134*  K 2.7*   < >  --  3.2* 2.8*  --  3.9 3.7  3.4*  CL 95*   < >  --  101 99  --  102 103 103  CO2 29   < >  --  25 26  --  24 23 21*  GLUCOSE 93   < >  --  100* 95  --  92 102* 96  BUN 24*   < >  --  19 18  --  15 16 15   CREATININE 0.98   < >  --  0.95 0.96  --  0.92 0.82 0.88  CALCIUM 13.9*   < >  --  12.8* 13.0*  --  13.1* 12.8* 12.0*  MG 1.5*  --  1.2*  --   --  2.7*  --  2.0  --    < > = values in this interval not displayed.   GFR: Estimated Creatinine Clearance: 88.3 mL/min (by C-G formula based on SCr of 0.88 mg/dL). Liver Function Tests: Recent Labs  Lab 02/02/20 1118 02/02/20 1535 02/02/20 2235 02/03/20 0223 02/03/20 0906  AST 30 31 32 32 28  ALT 18 17 18 18 17   ALKPHOS 130* 133* 133* 136* 123  BILITOT 0.6 0.9 0.7 0.8 0.9  PROT 6.6 6.8 7.1 7.1 6.8  ALBUMIN 2.3* 2.3* 2.3* 2.4* 2.3*   No results for input(s): LIPASE, AMYLASE in the last 168 hours. No results for input(s): AMMONIA in the last 168 hours. Coagulation Profile: No results for input(s): INR, PROTIME in the last 168 hours. Cardiac Enzymes: No results for input(s): CKTOTAL, CKMB, CKMBINDEX, TROPONINI in the last 168 hours. BNP (last 3 results) No results for input(s): PROBNP in the last 8760 hours. HbA1C: No results for input(s): HGBA1C in the last 72 hours. CBG: No results for input(s): GLUCAP in the last 168 hours. Lipid Profile: No results for input(s): CHOL, HDL, LDLCALC, TRIG, CHOLHDL, LDLDIRECT in the last 72 hours. Thyroid Function Tests: No results for input(s): TSH, T4TOTAL, FREET4, T3FREE, THYROIDAB in the last 72 hours. Anemia Panel: No results for input(s): VITAMINB12, FOLATE, FERRITIN, TIBC, IRON, RETICCTPCT in the last 72 hours. Sepsis Labs: No results for input(s): PROCALCITON, LATICACIDVEN in the last 168 hours.  Recent Results (from the past 240 hour(s))  SARS Coronavirus 2 by RT PCR (hospital order, performed in [facility] hospital lab) Nasopharyngeal Nasopharyngeal Swab     Status: None   Collection Time: 02/01/20  6:28 PM    Specimen: Nasopharyngeal Swab  Result Value Ref Range Status   SARS Coronavirus 2 NEGATIVE NEGATIVE Final    Comment: (NOTE) SARS-CoV-2 target nucleic acids are NOT DETECTED. The SARS-CoV-2 RNA is generally detectable in upper and lower respiratory specimens during the acute phase of infection. The lowest concentration of SARS-CoV-2 viral copies this assay can detect is 250 copies / mL. A negative result does not preclude SARS-CoV-2 infection and should not be used as the sole basis for treatment or other patient management decisions.  A negative result may occur with improper specimen collection / handling, submission of specimen other than nasopharyngeal swab, presence of viral mutation(s) within the areas targeted by this assay, and inadequate number of viral copies (<250 copies / mL). A negative result must be combined with clinical observations, patient history, and epidemiological information. Fact Sheet for Patients:   StrictlyIdeas.no Fact Sheet for Healthcare Providers: BankingDealers.co.za This test is not yet approved or cleared  by the [place] FDA and has been authorized for detection and/or diagnosis of SARS-CoV-2 by FDA under an Emergency Use Authorization (EUA).  This EUA will remain in effect (meaning this test can be used) for the duration of the COVID-19 declaration under Section 564(b)(1) of the Act, 21 U.S.C. section 360bbb-3(b)(1), unless the authorization is terminated or revoked sooner. Performed at [facility], [place] [address], [place], [place] [address]          Radiology Studies: No results found.      Scheduled Meds: . nicotine  14 mg Transdermal Daily  . OLANZapine  2.5 mg Oral QHS  . sodium chloride flush  3 mL Intravenous Q12H   Continuous Infusions: . sodium chloride    . HYDROmorphone  1 mg/hr (02/03/20 1317)     Time spent: 45 minutes with over 50% of the time  coordinating the patient's care    Harold Hedge, DO [facility] Pager [phone]  Call night coverage person covering after 7pm

## 2020-02-03 NOTE — Progress Notes (Signed)
Order was discontinued for a precedex drip. Sitter was ordered for patient at bedside. Provider wants to hold dilaudid PCA, morphine, and ativan at this time to see if the drug combination is causing increased confusion.  Per MD, notify in a few hours when patient is more awake and alert.Roderick Pee

## 2020-02-03 NOTE — Progress Notes (Addendum)
Notified that the patient is agitated and would like to leave AMA.  He did not express capacity earlier in the day to make an informed decision to leave AMA.  Advised nurse to give PRN haldol for agitation.  -Marva Panda, DO

## 2020-02-03 NOTE — Discharge Summary (Signed)
AMA NOTE    Patient ID: Nicolas Welch MRN: [id number] DOB/AGE: [birth year] 49 y.o. Primary Care Physician:  Patient, No Pcp Per  Admit date: 02/01/2020 Discharge date: 02/03/2020   PLEASE NOTE THAT PATIENT LEFT AGAINST MEDICAL ADVICE. Risks of poor pain control and death were explained in detail to the patient and he/she verbally understood the risks of leaving AMA.   Discharge Diagnoses:   Present on Admission: . Non-small cell carcinoma of right lung, stage 4 ([place]) . 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  ED Course:labs, imaging, pain control, potassium, 500 cc NS. EDP spoke w/ hospice nurse who says a bed should be available @ [place] in the next couple of days.  Also with hypercalcemia of malignancy and started on IV fluids  5/29: Placed on PCA pump.  IV fluids increased and given dose of zoledronic acid with minimal improvement in calcium.  Given a dose of calcitonin.  Overnight patient was agitated and confused likely from Ativan and all opiates and benzos discontinued 5/30: Palliative care consulted, patient transitioned to comfort measures  Patient had shown agitation and initially without capacity to make a decision earlier in the day to leave AMA and was given a low dose of Haldol which only helped for a limited period of time. Hospitalist was paged roughly one hour after Haldol was given that the patient wished to leave AMA again. He  was assessed at bedside by the hospitalist and expressed a desire to leave and stated "I just want to die at home doc, I don't want to die here. I have a high pain tolerance, I've been a drug abuser for my whole life and these medications are not helping me." A full capacity evaluation was performed and Mr. Nicolas Welch expressed full capacity to make a decision to leave AMA.   Capacity Assessment:   1. Patient is able to explain their medical condition.  2. Patient is able to explain the proposed treatment.  3. Patient is able to explain alternatives to the proposed treatment.  4. Patient is understands that they can decline the proposed the treatment.  5. Patient is able to explain the possible consequences of accepting the proposed treatment.  6. Patient is able to explain the possible consequences of refusing the proposed treatment.  7. Depression is not influencing the patient's decision.  8. Delusion or psychosis is/is not influencing the patient's decision.   Overall impression:  Definitely capable    Comments:      Consults:  palliative  Allergies:   Allergies  Allergen Reactions  . Egg [Eggs Or Egg-Derived Products] Hives, Itching and Swelling  . Milk-Related Compounds Hives, Itching and Swelling  . Wheat Bran Hives, Itching and Swelling    Discharge Medications: Please note that patient left AMA (against medical advice)   Hospital Course:  Principal Problem:   Cancer associated pain Active Problems:   Non-small cell carcinoma of right lung, stage 4 (HCC)   Hypercalcemia of malignancy  Hypokalemia   Closed fracture of multiple ribs   End of life care   Palliative care encounter   Day of Discharge BP (!) 139/96 (BP Location: Right Arm)   Pulse (!) 107   Temp 98.3 F (36.8 C) (Oral)   Resp 19   Ht 6' (1.829 m)   Wt 60.8 kg   SpO2 98%   BMI 18.18 kg/m   Physical Exam: Physical Exam    The results of significant diagnostics from this hospitalization  (including imaging, microbiology, ancillary and laboratory) are listed below for reference.    LAB RESULTS: Basic Metabolic Panel: Recent Labs  Lab 02/03/20 0223 02/03/20 0906  NA 136 134*  K 3.7 3.4*  CL 103 103  CO2 23 21*  GLUCOSE 102* 96  BUN 16 15  CREATININE 0.82 0.88  CALCIUM 12.8* 12.0*  MG 2.0  --    Liver Function Tests: Recent Labs  Lab 02/03/20 0223 02/03/20 0906  AST 32 28  ALT 18 17  ALKPHOS 136* 123  BILITOT 0.8 0.9  PROT 7.1 6.8  ALBUMIN 2.4* 2.3*   No results for input(s): LIPASE, AMYLASE in the last 168 hours. No results for input(s): AMMONIA in the last 168 hours. CBC: Recent Labs  Lab 02/01/20 1433  WBC 13.4*  NEUTROABS 12.1*  HGB 8.5*  HCT 26.5*  MCV 91.4  PLT 180   Cardiac Enzymes: No results for input(s): CKTOTAL, CKMB, CKMBINDEX, TROPONINI in the last 168 hours. BNP: Invalid input(s): POCBNP CBG: No results for input(s): GLUCAP in the last 168 hours.  Significant Diagnostic Studies:  DG Chest 2 View  Result Date: 02/01/2020 CLINICAL DATA:  Diffuse pain following a fall from a porch 1 day ago. Metastatic lung cancer. EXAM: CHEST - 2 VIEW COMPARISON:  Chest, abdomen and pelvis CT dated 12/14/2019. FINDINGS: Increased ill-defined densities in both lungs with areas of nodularity. Interval displaced right posterior 4th rib fracture. Mild cortical irregularity in the inferior distal right clavicle without significant change. Lytic metastasis in the lateral aspect of the right 1st rib. Lytic destruction of the majority of the left 8th posterior rib and mildly displaced fracture of the left posterior 9th rib in an area of lytic destruction. No pneumothorax. Normal sized heart. IMPRESSION: 1. Interval displaced right posterior 4th rib fracture and mildly displaced left posterior 9th rib fracture without pneumothorax. 2. Progressive pulmonary, pleural and bony metastatic disease. Electronically Signed   By: Claudie Revering M.D.   On: 02/01/2020 16:05    DG Lumbar Spine 2-3 Views  Result Date: 02/01/2020 CLINICAL DATA:  Low back pain following a fall from a porch yesterday. EXAM: LUMBAR SPINE - 2-3 VIEW COMPARISON:  Abdomen and pelvis CT dated 12/14/2019 FINDINGS: Multiple lytic lesions in the lumbar spine are better visualized on the previous CT images. There has been no significant change in a mildly comminuted fracture of the L3 vertebral body on the right with mild compression. A pathological fracture of the superior aspect of the L5 vertebral body is again demonstrated. No acute fractures or subluxations seen. Atheromatous arterial calcifications. IMPRESSION: 1. No acute fracture or subluxation. 2. Stable mildly comminuted pathological fracture of the L3 vertebral body on the right with mild compression. 3. A mild superior endplate pathological fracture of the L5 vertebra is again demonstrated. Electronically Signed   By: Claudie Revering M.D.   On: 02/01/2020 16:00   CT Head Wo Contrast  Result Date: 02/01/2020 CLINICAL DATA:  Acute pain due to trauma EXAM: CT HEAD  WITHOUT CONTRAST CT CERVICAL SPINE WITHOUT CONTRAST TECHNIQUE: Multidetector CT imaging of the head and cervical spine was performed following the standard protocol without intravenous contrast. Multiplanar CT image reconstructions of the cervical spine were also generated. COMPARISON:  MRI dated December 02, 2019.  PET-CT dated 11/02/2019 FINDINGS: CT HEAD FINDINGS Brain: No evidence of acute infarction, hemorrhage, hydrocephalus, extra-axial collection or mass lesion/mass effect. Vascular: No hyperdense vessel or unexpected calcification. Skull: Again noted is a soft tissue mass centered in the left jugular foramen resulting in adjacent osseous destruction. This has progressed since the patient's prior PET-CT dated 11/02/2019 Sinuses/Orbits: The paranasal sinuses are essentially clear. There is opacification of the left mastoid air cells. The right mastoid air cells are clear. Other: None. CT  CERVICAL SPINE FINDINGS Alignment: Normal. Skull base and vertebrae: Multiple lytic lesions are noted, specifically involving the C7, T1, T2, and T3 vertebral bodies. There is no definite evidence for an acute fracture. There is a lytic lesion involving the right first rib as well as the left second rib. Soft tissues and spinal canal: No prevertebral fluid or swelling. No visible canal hematoma. There is no significant Disc levels: Mild multilevel disc height loss is noted throughout the cervical spine. Upper chest: Negative. Other: None IMPRESSION: 1. No acute traumatic abnormality. 2. Again identified are metastatic lytic lesions involving the skull base and cervical spine. These have significantly progressed since the patient's prior imaging. Electronically Signed   By: Constance Holster M.D.   On: 02/01/2020 15:55   CT Cervical Spine Wo Contrast  Result Date: 02/01/2020 CLINICAL DATA:  Acute pain due to trauma EXAM: CT HEAD WITHOUT CONTRAST CT CERVICAL SPINE WITHOUT CONTRAST TECHNIQUE: Multidetector CT imaging of the head and cervical spine was performed following the standard protocol without intravenous contrast. Multiplanar CT image reconstructions of the cervical spine were also generated. COMPARISON:  MRI dated December 02, 2019.  PET-CT dated 11/02/2019 FINDINGS: CT HEAD FINDINGS Brain: No evidence of acute infarction, hemorrhage, hydrocephalus, extra-axial collection or mass lesion/mass effect. Vascular: No hyperdense vessel or unexpected calcification. Skull: Again noted is a soft tissue mass centered in the left jugular foramen resulting in adjacent osseous destruction. This has progressed since the patient's prior PET-CT dated 11/02/2019 Sinuses/Orbits: The paranasal sinuses are essentially clear. There is opacification of the left mastoid air cells. The right mastoid air cells are clear. Other: None. CT CERVICAL SPINE FINDINGS Alignment: Normal. Skull base and vertebrae: Multiple lytic lesions are  noted, specifically involving the C7, T1, T2, and T3 vertebral bodies. There is no definite evidence for an acute fracture. There is a lytic lesion involving the right first rib as well as the left second rib. Soft tissues and spinal canal: No prevertebral fluid or swelling. No visible canal hematoma. There is no significant Disc levels: Mild multilevel disc height loss is noted throughout the cervical spine. Upper chest: Negative. Other: None IMPRESSION: 1. No acute traumatic abnormality. 2. Again identified are metastatic lytic lesions involving the skull base and cervical spine. These have significantly progressed since the patient's prior imaging. Electronically Signed   By: Constance Holster M.D.   On: 02/01/2020 15:55    2D ECHO:   Disposition and Follow-up:    DISPOSITION: Patient left AMA. He/ she was advised to seek follow-up with primary care physician.     DISCHARGE FOLLOW-UP   Signed:   Harold Hedge D.O. [facility] 02/03/2020, 6:31 PM

## 2020-02-03 NOTE — ED Provider Notes (Signed)
[facility] DEPT Provider Note   CSN: [id number] Arrival date & time: 02/03/20  2158     History Chief Complaint  Patient presents with  . Cancer Pain    Nicolas Welch is a 49 y.o. male.  The history is provided by the patient and medical records.   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  Patient just left AMA from the hospital a few hours ago.  He is unable to tell me why he decided to leave.  Per chart review, patient's wife was quite upset that he was allowed to sign out AMA.  Patient states he cannot control pain at home and knows he needs to stay here until his hospice bed is ready.  States pain 10/10, mostly in his back.  Denies any change is his overall symptoms, just pain uncontrolled.  Past Medical History:  Diagnosis Date  . Anginal pain ([place])   . Bipolar disorder ([place])   . Cancer ([place])     " lung and metastatic bone cancer "  . Depression   . GERD (gastroesophageal reflux disease)   . Hepatitis    HEP C  . Lung mass     Patient Active Problem List   Diagnosis Date Noted  . Closed fracture of multiple ribs   . End of life care   . Palliative care encounter   . Hypercalcemia of malignancy 02/01/2020  . Hypokalemia 02/01/2020  . Weight loss 11/12/2019  . Brain mass 10/29/2019  . Small cell carcinoma of right lung ([place]) 10/29/2019  . Encounter for antineoplastic chemotherapy 10/29/2019  . Encounter for antineoplastic immunotherapy 10/29/2019  . Mediastinal adenopathy   . Endobronchial cancer, right ([place])   . Non-small cell carcinoma of right lung, stage 4 ([place]) 10/05/2019  . Cancer associated pain 10/05/2019  . Goals of care, counseling/discussion 10/05/2019  . Bone metastasis ([place]) 10/05/2019  . Tobacco abuse 10/05/2019  . Heroin abuse ([place]) 10/05/2019    Past Surgical History:   Procedure Laterality Date  . APPENDECTOMY    . BRONCHIAL BIOPSY  10/12/2019   Procedure: BRONCHIAL BIOPSIES;  Surgeon: [name], DO;  Location: [facility];  Service: Thoracic;;  . BRONCHIAL NEEDLE ASPIRATION BIOPSY  10/12/2019   Procedure: BRONCHIAL NEEDLE ASPIRATION BIOPSIES;  Surgeon: [name], DO;  Location: [facility];  Service: Thoracic;;  . BRONCHIAL WASHINGS  10/12/2019   Procedure: BRONCHIAL WASHINGS;  Surgeon: [name], DO;  Location: [facility];  Service: Thoracic;;  . TONSILLECTOMY AND ADENOIDECTOMY    . VIDEO BRONCHOSCOPY WITH ENDOBRONCHIAL ULTRASOUND N/A 10/12/2019   Procedure: VIDEO BRONCHOSCOPY WITH ENDOBRONCHIAL ULTRASOUND;  Surgeon: [name], DO;  Location: [facility];  Service: Thoracic;  Laterality: N/A;  . WISDOM TOOTH EXTRACTION         History reviewed. No pertinent family history.  Social History   Tobacco Use  . Smoking status: Current Some Day Smoker    Packs/day: 1.00    Years: 30.00    Pack years: 30.00    Types: Cigarettes  . Smokeless tobacco: Never Used  . Tobacco comment: 1 cigarette every 2-3 days  Substance Use Topics  . Alcohol use: No  . Drug use: Yes    Types: Cocaine, IV, Marijuana, Heroin    Comment: heroin; last use cocaine 10/09/19    Home Medications Prior to Admission medications  Medication Sig Start Date End Date Taking? Authorizing Provider  acetaminophen (TYLENOL) 500 MG tablet Take 1,000 mg by mouth every 8 (eight) hours as needed for mild pain, moderate pain or headache.    [provider]  albuterol (PROVENTIL) (2.5 MG/3ML) 0.083% nebulizer solution Take 3 mLs (2.5 mg total) by nebulization every 6 (six) hours as needed for wheezing or shortness of breath. Patient not taking: Reported on 12/26/2019 11/07/19   June Leap L, DO  fentaNYL (DURAGESIC) 100 MCG/HR Place 1 patch onto the skin every 3 (three) days.    [provider]  Fluticasone-Umeclidin-Vilant (TRELEGY ELLIPTA)  100-62.5-25 MCG/INH AEPB Inhale 1 puff into the lungs daily. Patient taking differently: Inhale 1 puff into the lungs daily as needed (SOB, wheezing).  11/07/19   Icard, Octavio Graves, DO  ibuprofen (ADVIL) 200 MG tablet Take 600-8,000 mg by mouth every 6 (six) hours as needed for headache, mild pain or moderate pain.     [provider]  oxyCODONE-acetaminophen (PERCOCET) 10-325 MG tablet Take 1 tablet by mouth every 4 (four) hours as needed for pain.    [provider]  prochlorperazine (COMPAZINE) 10 MG tablet Take 1 tablet (10 mg total) by mouth every 6 (six) hours as needed for nausea or vomiting. Patient not taking: Reported on 02/01/2020 11/26/19   Curt Bears, MD    Allergies    Egg [eggs or egg-derived products], Milk-related compounds, and Wheat bran  Review of Systems   Review of Systems  Constitutional:       Pain control  All other systems reviewed and are negative.   Physical Exam Updated Vital Signs BP (!) 135/93 (BP Location: Left Arm)   Pulse (!) 103   Temp 97.9 F (36.6 C) (Oral)   Resp 18   SpO2 98%   Physical Exam Vitals and nursing note reviewed.  Constitutional:      Appearance: He is well-developed.     Comments: Thin, pale, wrapped up in blankets Somewhat drowsy appearing but responds when spoken to  HENT:     Head: Normocephalic and atraumatic.  Eyes:     Conjunctiva/sclera: Conjunctivae normal.     Pupils: Pupils are equal, round, and reactive to light.  Cardiovascular:     Rate and Rhythm: Normal rate and regular rhythm.     Heart sounds: Normal heart sounds.  Pulmonary:     Effort: Pulmonary effort is normal. No respiratory distress.     Breath sounds: Normal breath sounds. No rhonchi.  Abdominal:     General: Bowel sounds are normal.     Palpations: Abdomen is soft.     Tenderness: There is no abdominal tenderness. There is no rebound.  Musculoskeletal:        General: Normal range of motion.     Cervical back: Normal range  of motion.  Skin:    General: Skin is warm and dry.  Neurological:     Mental Status: He is alert and oriented to person, place, and time.     ED Results / Procedures / Treatments   Labs (all labs ordered are listed, but only abnormal results are displayed) Labs Reviewed - No data to display  EKG None  Radiology No results found.  Procedures Procedures (including critical care time)  Medications Ordered in ED Medications  HYDROmorphone (DILAUDID) injection 1 mg (has no administration in time range)  0.9 %  sodium chloride infusion (has no administration in time range)    ED Course  I have  reviewed the triage vital signs and the nursing notes.  Pertinent labs & imaging results that were available during my care of the patient were reviewed by me and considered in my medical decision making (see chart for details).    MDM Rules/Calculators/A&P  49 year old male presenting to the ED requesting pain control.  He signed out AMA from the floor a few hours ago.  He is unable to tell me exactly why he left.  He is currently receiving end-of-life care for metastatic small cell lung cancer.  Per chart review, it appears his wife was very upset that he was allowed to sign out AMA in the first place.  He is afebrile and nontoxic here, he is tachycardic.  He is thin, pale, wrapped in blankets.  Somewhat drowsy here but does respond appropriately when spoken to. He states his pain is a 10 out of 10, mostly in his back.  He does have known spinal mets.  He was on Dilaudid PCA upstairs so we will give push now for pain control, start maintenance fluids.  Have spoken with hospitalist, Dr. Olevia Bowens-- will re-admit for continued pain control pending hospice placement.  Final Clinical Impression(s) / ED Diagnoses Final diagnoses:  Admission for end of life care    Rx / DC Orders ED Discharge Orders    None       Larene Pickett, PA-C 02/03/20 2308    Lacretia Leigh, MD 02/06/20 2225

## 2020-02-03 NOTE — Progress Notes (Signed)
Late afternoon check in.  Patient still complaining of pain.  He has had 3 boluses and is about to receive a third.  Will increase the range of the comfort care gtt from 0.5 - 1 to 1 - 2.  PMT will check in first thing in the morning.    Florentina Jenny, PA-C Palliative Medicine Office:  [phone]

## 2020-02-03 NOTE — Progress Notes (Signed)
Wife called and is upset patient has signed out AMA and we're letting him  Leave.It was explained the Dr.sat down and spoke with him and determined he was capable of making the decision to leave. [facility], Margarita Grizzle spoke to the wife on the phone.

## 2020-02-03 NOTE — Progress Notes (Signed)
   02/02/20 2304  Assess: MEWS Score  Resp (!) 29  SpO2 100 %  O2 Device Nasal Cannula  O2 Flow Rate (L/min) 2 L/min  Assess: MEWS Score  MEWS Temp 0  MEWS Systolic 0  MEWS Pulse 1  MEWS RR 2  MEWS LOC 0  MEWS Score 3  MEWS Score Color Yellow  Assess: if the MEWS score is Yellow or Red  Were vital signs taken at a resting state? Yes  Focused Assessment Documented focused assessment  Early Detection of Sepsis Score *See Row Information* Low  MEWS guidelines implemented *See Row Information* No, previously yellow, continue vital signs every 4 hours  Treat  MEWS Interventions Administered scheduled meds/treatments  Take Vital Signs  Increase Vital Sign Frequency  Yellow: Q 2hr X 2 then Q 4hr X 2, if remains yellow, continue Q 4hrs  Escalate  MEWS: Escalate Yellow: discuss with charge nurse/RN and consider discussing with provider and RRT  Notify: Provider  Provider Name/Title [place]  Date Provider Notified 02/02/20  Time Provider Notified 2251  Notification Type Page  Notification Reason Other (Comment) (increased ativan frequency for anxiety; reminder about MEWs)  Response See new orders  Date of Provider Response 02/02/20  Time of Provider Response 2251  Document  Patient Outcome Stabilized after interventions  Progress note created (see row info) Yes

## 2020-02-03 NOTE — Progress Notes (Signed)
[facility] has no available beds today. We will keep pt on our list and consult with PMT once they see pt.   Thank you,  Freddie Breech, RN [facility] Liaison [phone]

## 2020-02-03 NOTE — Progress Notes (Signed)
Pt was resting when I arrived. His sitter said he had not slept much so I didn't attempt to arouse him. Please page if assistance is needed.  [place], MDiv   02/03/20 1400  Clinical Encounter Type  Visited With Health care provider

## 2020-02-03 NOTE — Progress Notes (Signed)
Patient has pulled out his IV and states he is going home. He states he wants to die at home. Explained that he would not be able to control his pain at home. He states he will. Dr. Neysa Bonito contacted and will come to speak to patient. Patient's friend Aaron Edelman called and states he will pick up patient.

## 2020-02-03 NOTE — Progress Notes (Signed)
   02/03/20 0506  Assess: MEWS Score  Temp (!) 97.4 F (36.3 C)  BP 138/87  Pulse Rate (!) 111  Resp (!) 38  SpO2 98 %  O2 Device Room Air  Assess: MEWS Score  MEWS Temp 0  MEWS Systolic 0  MEWS Pulse 2  MEWS RR 3  MEWS LOC 0  MEWS Score 5  MEWS Score Color Red  Assess: if the MEWS score is Yellow or Red  Were vital signs taken at a resting state? Yes  Focused Assessment Documented focused assessment  Early Detection of Sepsis Score *See Row Information* Low  MEWS guidelines implemented *See Row Information* Yes  Treat  MEWS Interventions Administered scheduled meds/treatments  Take Vital Signs  Increase Vital Sign Frequency  Red: Q 1hr X 4 then Q 4hr X 4, if remains red, continue Q 4hrs  Escalate  MEWS: Escalate Red: discuss with charge nurse/RN and provider, consider discussing with RRT  Notify: Charge Nurse/RN  Name of Charge Nurse/RN Notified Dorris Carnes  Date Charge Nurse/RN Notified 02/03/20  Time Charge Nurse/RN Notified 0533  Notify: Provider  Provider Name/Title  (Spoke with Stark Klein earlier; MD aware. Pt. red/yellow all day.)  Notify: Rapid Response  Name of Rapid Response RN Notified  (RR RN aware. Pt. has been red/yellow all day. )  Document  Patient Outcome Other (Comment) (not appropriate for transfer to ICU. hospice at DC. )  Progress note created (see row info) Yes   Patient now has a red MEWS score again.  MD and RR RN aware of red and yellow MEWS.Roderick Pee

## 2020-02-03 NOTE — Progress Notes (Signed)
   02/03/20 0113  Assess: MEWS Score  Temp 97.6 F (36.4 C)  BP 111/83  Pulse Rate (!) 110  Resp (!) 35  SpO2 91 %  Assess: MEWS Score  MEWS Temp 0  MEWS Systolic 0  MEWS Pulse 1  MEWS RR 2  MEWS LOC 0  MEWS Score 3  MEWS Score Color Yellow  Assess: if the MEWS score is Yellow or Red  Were vital signs taken at a resting state? Yes  Focused Assessment Documented focused assessment  Early Detection of Sepsis Score *See Row Information* Low  MEWS guidelines implemented *See Row Information* No, previously yellow, continue vital signs every 4 hours  Treat  MEWS Interventions Administered scheduled meds/treatments  Take Vital Signs  Increase Vital Sign Frequency  Yellow: Q 2hr X 2 then Q 4hr X 2, if remains yellow, continue Q 4hrs  Escalate  MEWS: Escalate Yellow: discuss with charge nurse/RN and consider discussing with provider and RRT  Notify: Charge Nurse/RN  Name of Charge Nurse/RN Notified  (already discussed w/ Agricultural consultant)  Notify: Provider  Provider Name/Title  (Already spoke to MD)  Notify: Rapid Response  Name of Rapid Response RN Notified  (Already Spoke with RR RN)  Document  Patient Outcome Other (Comment) (not appropriate for transfer to ICU. )  Progress note created (see row info) Yes

## 2020-02-04 ENCOUNTER — Encounter (HOSPITAL_COMMUNITY): Payer: Self-pay | Admitting: Internal Medicine

## 2020-02-04 ENCOUNTER — Other Ambulatory Visit: Payer: Self-pay

## 2020-02-04 DIAGNOSIS — Z923 Personal history of irradiation: Secondary | ICD-10-CM | POA: Diagnosis not present

## 2020-02-04 DIAGNOSIS — Z515 Encounter for palliative care: Secondary | ICD-10-CM | POA: Diagnosis present

## 2020-02-04 DIAGNOSIS — Z681 Body mass index (BMI) 19 or less, adult: Secondary | ICD-10-CM | POA: Diagnosis not present

## 2020-02-04 DIAGNOSIS — M549 Dorsalgia, unspecified: Secondary | ICD-10-CM | POA: Diagnosis not present

## 2020-02-04 DIAGNOSIS — C7951 Secondary malignant neoplasm of bone: Secondary | ICD-10-CM

## 2020-02-04 DIAGNOSIS — F1110 Opioid abuse, uncomplicated: Secondary | ICD-10-CM | POA: Diagnosis present

## 2020-02-04 DIAGNOSIS — C3491 Malignant neoplasm of unspecified part of right bronchus or lung: Secondary | ICD-10-CM | POA: Diagnosis present

## 2020-02-04 DIAGNOSIS — F17210 Nicotine dependence, cigarettes, uncomplicated: Secondary | ICD-10-CM | POA: Diagnosis present

## 2020-02-04 DIAGNOSIS — G4700 Insomnia, unspecified: Secondary | ICD-10-CM | POA: Diagnosis present

## 2020-02-04 DIAGNOSIS — F319 Bipolar disorder, unspecified: Secondary | ICD-10-CM | POA: Diagnosis present

## 2020-02-04 DIAGNOSIS — S2243XA Multiple fractures of ribs, bilateral, initial encounter for closed fracture: Secondary | ICD-10-CM | POA: Diagnosis present

## 2020-02-04 DIAGNOSIS — G893 Neoplasm related pain (acute) (chronic): Secondary | ICD-10-CM | POA: Diagnosis present

## 2020-02-04 DIAGNOSIS — C787 Secondary malignant neoplasm of liver and intrahepatic bile duct: Secondary | ICD-10-CM | POA: Diagnosis present

## 2020-02-04 DIAGNOSIS — E46 Unspecified protein-calorie malnutrition: Secondary | ICD-10-CM | POA: Diagnosis present

## 2020-02-04 DIAGNOSIS — F419 Anxiety disorder, unspecified: Secondary | ICD-10-CM | POA: Diagnosis not present

## 2020-02-04 DIAGNOSIS — C7931 Secondary malignant neoplasm of brain: Secondary | ICD-10-CM | POA: Diagnosis present

## 2020-02-04 DIAGNOSIS — E876 Hypokalemia: Secondary | ICD-10-CM | POA: Diagnosis present

## 2020-02-04 DIAGNOSIS — K219 Gastro-esophageal reflux disease without esophagitis: Secondary | ICD-10-CM | POA: Diagnosis present

## 2020-02-04 DIAGNOSIS — R64 Cachexia: Secondary | ICD-10-CM | POA: Diagnosis present

## 2020-02-04 DIAGNOSIS — Z20822 Contact with and (suspected) exposure to covid-19: Secondary | ICD-10-CM | POA: Diagnosis present

## 2020-02-04 DIAGNOSIS — Z7951 Long term (current) use of inhaled steroids: Secondary | ICD-10-CM | POA: Diagnosis not present

## 2020-02-04 DIAGNOSIS — W109XXA Fall (on) (from) unspecified stairs and steps, initial encounter: Secondary | ICD-10-CM | POA: Diagnosis present

## 2020-02-04 DIAGNOSIS — Z66 Do not resuscitate: Secondary | ICD-10-CM | POA: Diagnosis present

## 2020-02-04 DIAGNOSIS — Z9221 Personal history of antineoplastic chemotherapy: Secondary | ICD-10-CM | POA: Diagnosis not present

## 2020-02-04 DIAGNOSIS — R54 Age-related physical debility: Secondary | ICD-10-CM | POA: Diagnosis present

## 2020-02-04 MED ORDER — HALOPERIDOL LACTATE 5 MG/ML IJ SOLN: 5.0000 mg | Freq: Four times a day (QID) | INTRAMUSCULAR | Status: DC | PRN

## 2020-02-04 MED ORDER — HYDROMORPHONE BOLUS VIA INFUSION: 1.0000 mg | INTRAVENOUS | Status: DC | PRN

## 2020-02-04 MED ORDER — NALOXONE HCL 0.4 MG/ML IJ SOLN: 0.4000 mg | INTRAMUSCULAR | Status: DC | PRN

## 2020-02-04 MED ORDER — GLYCOPYRROLATE 0.2 MG/ML IJ SOLN: 0.2000 mg | INTRAMUSCULAR | Status: DC | PRN

## 2020-02-04 MED ORDER — HALOPERIDOL 0.5 MG PO TABS: 2.0000 mg | ORAL_TABLET | Freq: Four times a day (QID) | ORAL | Status: DC | PRN

## 2020-02-04 MED ORDER — DIPHENHYDRAMINE HCL 12.5 MG/5ML PO ELIX: 12.5000 mg | ORAL_SOLUTION | Freq: Four times a day (QID) | ORAL | Status: DC | PRN

## 2020-02-04 MED ORDER — UMECLIDINIUM BROMIDE 62.5 MCG/INH IN AEPB
1.0000 | INHALATION_SPRAY | Freq: Every day | RESPIRATORY_TRACT | Status: DC
  Administered 2020-02-04 – 2020-02-05 (×2): 1 via RESPIRATORY_TRACT
  Filled 2020-02-04: qty 7

## 2020-02-04 MED ORDER — HYDROMORPHONE BOLUS VIA INFUSION
1.0000 mg | INTRAVENOUS | Status: DC | PRN
  Filled 2020-02-04: qty 2

## 2020-02-04 MED ORDER — BIOTENE DRY MOUTH MT LIQD: 15.0000 mL | OROMUCOSAL | Status: DC | PRN

## 2020-02-04 MED ORDER — OLANZAPINE 5 MG PO TABS
10.0000 mg | ORAL_TABLET | Freq: Every day | ORAL | Status: DC
  Administered 2020-02-04 – 2020-02-05 (×2): 10 mg via ORAL
  Filled 2020-02-04 (×2): qty 2

## 2020-02-04 MED ORDER — SODIUM CHLORIDE 0.9 % IV SOLN
25.0000 mg | Freq: Three times a day (TID) | INTRAVENOUS | Status: DC | PRN
  Filled 2020-02-04: qty 1

## 2020-02-04 MED ORDER — HYDROMORPHONE 1 MG/ML IV SOLN
INTRAVENOUS | Status: DC
  Administered 2020-02-04: 5.1 mg via INTRAVENOUS

## 2020-02-04 MED ORDER — HYDROMORPHONE BOLUS VIA INFUSION
1.0000 mg | INTRAVENOUS | Status: DC | PRN
  Administered 2020-02-04 – 2020-02-05 (×6): 2 mg via INTRAVENOUS
  Administered 2020-02-05: 1 mg via INTRAVENOUS
  Administered 2020-02-05 – 2020-02-06 (×10): 2 mg via INTRAVENOUS
  Filled 2020-02-04: qty 2

## 2020-02-04 MED ORDER — SODIUM CHLORIDE 0.9% FLUSH: 9.0000 mL | INTRAVENOUS | Status: DC | PRN

## 2020-02-04 MED ORDER — CHLORHEXIDINE GLUCONATE 0.12 % MT SOLN
15.0000 mL | Freq: Two times a day (BID) | OROMUCOSAL | Status: DC
  Administered 2020-02-04 – 2020-02-05 (×3): 15 mL via OROMUCOSAL
  Filled 2020-02-04 (×4): qty 15

## 2020-02-04 MED ORDER — ONDANSETRON HCL 4 MG/2ML IJ SOLN: 4.0000 mg | Freq: Four times a day (QID) | INTRAMUSCULAR | Status: DC | PRN

## 2020-02-04 MED ORDER — SODIUM CHLORIDE 0.9 % IV SOLN: 25.0000 mg | Freq: Three times a day (TID) | INTRAVENOUS | Status: DC | PRN

## 2020-02-04 MED ORDER — ONDANSETRON 4 MG PO TBDP: 4.0000 mg | ORAL_TABLET | Freq: Four times a day (QID) | ORAL | Status: DC | PRN

## 2020-02-04 MED ORDER — POLYVINYL ALCOHOL 1.4 % OP SOLN: 1.0000 [drp] | Freq: Four times a day (QID) | OPHTHALMIC | Status: DC | PRN

## 2020-02-04 MED ORDER — DIPHENHYDRAMINE HCL 50 MG/ML IJ SOLN: 12.5000 mg | Freq: Four times a day (QID) | INTRAMUSCULAR | Status: DC | PRN

## 2020-02-04 MED ORDER — OLANZAPINE 5 MG PO TABS: 2.5000 mg | ORAL_TABLET | Freq: Every day | ORAL | Status: DC

## 2020-02-04 MED ORDER — SODIUM CHLORIDE 0.9 % IV SOLN
2.0000 mg/h | INTRAVENOUS | Status: DC
  Administered 2020-02-04: 2 mg/h via INTRAVENOUS
  Administered 2020-02-04: 3 mg/h via INTRAVENOUS
  Filled 2020-02-04 (×4): qty 2.5

## 2020-02-04 MED ORDER — HYDROMORPHONE 1 MG/ML IV SOLN
INTRAVENOUS | Status: DC
  Administered 2020-02-04: 30 mg via INTRAVENOUS
  Administered 2020-02-04: 5.02 mg via INTRAVENOUS
  Filled 2020-02-04: qty 30

## 2020-02-04 MED ORDER — FLUTICASONE FUROATE-VILANTEROL 100-25 MCG/INH IN AEPB
1.0000 | INHALATION_SPRAY | Freq: Every day | RESPIRATORY_TRACT | Status: DC
  Administered 2020-02-04 – 2020-02-05 (×2): 1 via RESPIRATORY_TRACT
  Filled 2020-02-04: qty 28

## 2020-02-04 MED ORDER — SENNOSIDES-DOCUSATE SODIUM 8.6-50 MG PO TABS
2.0000 | ORAL_TABLET | Freq: Every day | ORAL | Status: DC
  Administered 2020-02-04 – 2020-02-05 (×2): 2 via ORAL
  Filled 2020-02-04 (×2): qty 2

## 2020-02-04 MED ORDER — HALOPERIDOL LACTATE 5 MG/ML IJ SOLN: 0.5000 mg | Freq: Four times a day (QID) | INTRAMUSCULAR | Status: DC | PRN

## 2020-02-04 MED ORDER — HALOPERIDOL LACTATE 5 MG/ML IJ SOLN: 2.0000 mg | Freq: Four times a day (QID) | INTRAMUSCULAR | Status: DC | PRN

## 2020-02-04 MED ORDER — SODIUM CHLORIDE 0.9 % IV SOLN
2.0000 mg/h | INTRAVENOUS | Status: DC
  Administered 2020-02-04: 3 mg/h via INTRAVENOUS
  Administered 2020-02-05: 5 mg/h via INTRAVENOUS
  Filled 2020-02-04 (×2): qty 10

## 2020-02-04 MED ORDER — ORAL CARE MOUTH RINSE
15.0000 mL | Freq: Two times a day (BID) | OROMUCOSAL | Status: DC
  Administered 2020-02-04: 15 mL via OROMUCOSAL

## 2020-02-04 MED ORDER — GLYCOPYRROLATE 1 MG PO TABS: 1.0000 mg | ORAL_TABLET | ORAL | Status: DC | PRN

## 2020-02-04 MED ORDER — HALOPERIDOL LACTATE 2 MG/ML PO CONC
2.0000 mg | ORAL | Status: DC | PRN
  Filled 2020-02-04: qty 1

## 2020-02-04 NOTE — Progress Notes (Signed)
PROGRESS NOTE    Nicolas Welch    Code Status: DNR  LKT:[id number] DOB: 31-May-1971 DOA: 02/03/2020 LOS: 0 days  PCP: Patient, No Pcp Per CC:  Chief Complaint  Patient presents with  . 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  ED Course:labs, imaging, pain control, potassium, 500 cc NS. EDP spoke w/ hospice nurse who says a bed should be available @ [facility] in the next couple of days. Also with hypercalcemia of malignancy and started on IV fluids  5/29: Placed on PCA pump. IV fluids increased and given dose of zoledronic acid with minimal improvement in calcium. Given a dose of calcitonin. Overnight patient was agitated and confused likely from Ativan and all opiates and benzos discontinued 5/30: Palliative care consulted, patient transitioned to comfort measures.  Left AMA  5/30 PM: returned soon after leaving AMA. Used illicit fentanyl at home without improvement in pain and returned for pain control/comfort measures.     A & P   Principal Problem:   Intractable back pain Active Problems:   Cancer associated pain   Bone metastasis (HCC)   Small cell carcinoma of right lung (HCC)   Hypercalcemia of malignancy   Hypokalemia   Admission for end of life care   Comfort measures only status   Small cell and non-small cell lung cancer with mets to brain and spine with cancer related pain 1. Diagnosed in January of this year s/p palliative  radiation and chemotherapy which she did not tolerate 2. Follows with [facility] 3. Palliative on board. Plan is comfort care while here and SW working on obtaining a bed at [facility]  Intractable pain S/p fall from several stairs up with subsequent right fourth rib fracture and mildly displaced left ninth rib fracture with acute on chronic cancer related pain  IV Drug Abuse 1. Has a high pain tolerance and so requires high amounts of opiates 2. Not safe to send home with opioids at this time 3. Dilaudid drip with PRN boluses, olanzapine for sleep, Haldol PRN 4. NO Benzos for agitation, patient has adverse effects to this and paradoxically has increased agitation  Full comfort measures  DVT prophylaxis: none Family Communication: discussed with patient  Disposition Plan:  Status is: Inpatient  Remains inpatient appropriate because:Ongoing active pain requiring inpatient pain management   Dispo: The patient is from: Home              Anticipated d/c is to: Hospice, TBD              Anticipated d/c date is: 3 days              Patient currently is medically stable to d/c.           Pressure injury documentation    None  Consultants  Palliative  Procedures  None  Antibiotics   Anti-infectives (From admission, onward)   None  Subjective   Complains of low back pain, asking for benzos for anxiety/sleep but this was denied due to adverse affects. No shortness of breath, nausea/vomiting. Admits to persistent pain. No other issues.   I discussed case with Haynes Dage from palliative care  Objective   Vitals:   02/04/20 0109 02/04/20 0535 02/04/20 0743 02/04/20 0744  BP:  126/87    Pulse:  (!) 105    Resp:  (!) 26  16  Temp:  98.6 F (37 C)    TempSrc:  Oral    SpO2:  99% 100%   Weight: 60.8 kg     Height: 6' (1.829 m)       Intake/Output Summary (Last 24 hours) at 02/04/2020 1547 Last data filed at 02/04/2020 1400 Gross per  24 hour  Intake 1218.97 ml  Output 750 ml  Net 468.97 ml   Filed Weights   02/04/20 0109  Weight: 60.8 kg    Examination:  Physical Exam Vitals and nursing note reviewed.  Constitutional:      General: He is not in acute distress.    Comments: Frail, cachectic  HENT:     Head:     Comments: Temporal wasting    Mouth/Throat:     Mouth: Mucous membranes are moist.  Eyes:     Conjunctiva/sclera: Conjunctivae normal.  Cardiovascular:     Rate and Rhythm: Regular rhythm. Tachycardia present.  Pulmonary:     Effort: Pulmonary effort is normal. No respiratory distress.  Abdominal:     General: There is no distension.  Musculoskeletal:        General: No swelling.     Comments: Lumbosacral tenderness without sign of pressure ulcer  Neurological:     Mental Status: He is alert. Mental status is at baseline.  Psychiatric:        Mood and Affect: Mood normal.        Behavior: Behavior normal.     Data Reviewed: I have personally reviewed following labs and imaging studies  CBC: Recent Labs  Lab 02/01/20 1433  WBC 13.4*  NEUTROABS 12.1*  HGB 8.5*  HCT 26.5*  MCV 91.4  PLT 697   Basic Metabolic Panel: Recent Labs  Lab 02/01/20 1433 02/02/20 0539 02/02/20 0726 02/02/20 1118 02/02/20 1535 02/02/20 1724 02/02/20 2235 02/03/20 0223 02/03/20 0906  NA 134*   < >  --  134* 132*  --  135 136 134*  K 2.7*   < >  --  3.2* 2.8*  --  3.9 3.7 3.4*  CL 95*   < >  --  101 99  --  102 103 103  CO2 29   < >  --  25 26  --  24 23 21*  GLUCOSE 93   < >  --  100* 95  --  92 102* 96  BUN 24*   < >  --  19 18  --  15 16 15   CREATININE 0.98   < >  --  0.95 0.96  --  0.92 0.82 0.88  CALCIUM 13.9*   < >  --  12.8* 13.0*  --  13.1* 12.8* 12.0*  MG 1.5*  --  1.2*  --   --  2.7*  --  2.0  --    < > = values in this interval not displayed.   GFR: Estimated Creatinine Clearance: 88.3 mL/min (by C-G formula based on SCr of 0.88 mg/dL). Liver Function Tests: Recent Labs  Lab  02/02/20  1118 02/02/20 1535 02/02/20 2235 02/03/20 0223 02/03/20 0906  AST 30 31 32 32 28  ALT 18 17 18 18 17   ALKPHOS 130* 133* 133* 136* 123  BILITOT 0.6 0.9 0.7 0.8 0.9  PROT 6.6 6.8 7.1 7.1 6.8  ALBUMIN 2.3* 2.3* 2.3* 2.4* 2.3*   No results for input(s): LIPASE, AMYLASE in the last 168 hours. No results for input(s): AMMONIA in the last 168 hours. Coagulation Profile: No results for input(s): INR, PROTIME in the last 168 hours. Cardiac Enzymes: No results for input(s): CKTOTAL, CKMB, CKMBINDEX, TROPONINI in the last 168 hours. BNP (last 3 results) No results for input(s): PROBNP in the last 8760 hours. HbA1C: No results for input(s): HGBA1C in the last 72 hours. CBG: No results for input(s): GLUCAP in the last 168 hours. Lipid Profile: No results for input(s): CHOL, HDL, LDLCALC, TRIG, CHOLHDL, LDLDIRECT in the last 72 hours. Thyroid Function Tests: No results for input(s): TSH, T4TOTAL, FREET4, T3FREE, THYROIDAB in the last 72 hours. Anemia Panel: No results for input(s): VITAMINB12, FOLATE, FERRITIN, TIBC, IRON, RETICCTPCT in the last 72 hours. Sepsis Labs: No results for input(s): PROCALCITON, LATICACIDVEN in the last 168 hours.  Recent Results (from the past 240 hour(s))  SARS Coronavirus 2 by RT PCR (hospital order, performed in [facility] hospital lab) Nasopharyngeal Nasopharyngeal Swab     Status: None   Collection Time: 02/01/20  6:28 PM   Specimen: Nasopharyngeal Swab  Result Value Ref Range Status   SARS Coronavirus 2 NEGATIVE NEGATIVE Final    Comment: (NOTE) SARS-CoV-2 target nucleic acids are NOT DETECTED. The SARS-CoV-2 RNA is generally detectable in upper and lower respiratory specimens during the acute phase of infection. The lowest concentration of SARS-CoV-2 viral copies this assay can detect is 250 copies / mL. A negative result does not preclude SARS-CoV-2 infection and should not be used as the sole basis for treatment or other patient  management decisions.  A negative result may occur with improper specimen collection / handling, submission of specimen other than nasopharyngeal swab, presence of viral mutation(s) within the areas targeted by this assay, and inadequate number of viral copies (<250 copies / mL). A negative result must be combined with clinical observations, patient history, and epidemiological information. Fact Sheet for Patients:   StrictlyIdeas.no Fact Sheet for Healthcare Providers: BankingDealers.co.za This test is not yet approved or cleared  by the [place] FDA and has been authorized for detection and/or diagnosis of SARS-CoV-2 by FDA under an Emergency Use Authorization (EUA).  This EUA will remain in effect (meaning this test can be used) for the duration of the COVID-19 declaration under Section 564(b)(1) of the Act, 21 U.S.C. section 360bbb-3(b)(1), unless the authorization is terminated or revoked sooner. Performed at [facility], [place] [address] [place], [place] [address]          Radiology Studies: No results found.      Scheduled Meds: . chlorhexidine  15 mL Mouth Rinse BID  . fluticasone furoate-vilanterol  1 puff Inhalation Daily   And  . umeclidinium bromide  1 puff Inhalation Daily  . mouth rinse  15 mL Mouth Rinse q12n4p  . OLANZapine  10 mg Oral QHS  . senna-docusate  2 tablet Oral QHS   Continuous Infusions: . chlorproMAZINE (THORAZINE) IV    . HYDROmorphone 3 mg/hr (02/04/20 1333)     Time spent: 35 minutes with over 50% of the time coordinating the patient's care    Harold Hedge, DO  [facility] Pager [phone]  Call night coverage person covering after 7pm

## 2020-02-04 NOTE — Progress Notes (Signed)
Patient readmitted to rm. 1611. He understands that he needs to stay here until he finds a hospice bed. Patient wants to be complete comfort care.  He understands that this means symptom management only.  He is alert and oriented x4 at this time.  2nd RN Consuela Mimes witnessed these wishes to remain comfort care only. Nicolas Welch

## 2020-02-04 NOTE — Progress Notes (Signed)
[facility] Documentation  At this time, [facility] providers do not feel that pt's prognosis is 2 weeks or less. It is agreed that this pt needs an inpt setting due to numerous social factors.   Liaison will continue to monitor pt for decline to determine future [facility] eligibility.   Please do not hesitate to outreach with any questions. PMT is aware that they can notify us with any changes that may make pt eligible.   Thank you,  Freddie Breech, RN [facility] Liaison [phone]

## 2020-02-04 NOTE — TOC Initial Note (Signed)
Transition of Care [facility]) - Initial/Assessment Note    Patient Details  Name: Nicolas Welch MRN: [id number] Date of Birth: 07/08/1971  Transition of Care [facility]) CM/SW Contact:    Nicolas Welch, Nicolas Welch Phone Number: 02/04/2020, 11:54 AM  Clinical Narrative:         Patient does not meet criteria for [facility] residential at this time. CSW met with the patient at bedside to discuss additional residential hospices choices that will accept patients with possible prognosis of two weeks or more. Patient agreeable to the residential [place] facility.  CSW reached out to the patient significant other to discuss choices as well. CSW unable to leave a voicemail. Per patient, she does not have good cell phone reception where they live. She should visit the patient later today.  Referral made to Nicolas Welch at the [facility].   Expected Discharge Plan: Nicolas Welch Barriers to Discharge: Continued Medical Work up   Patient Goals and CMS Choice        Expected Discharge Plan and Services Expected Discharge Plan: Nicolas Welch In-house Referral: Hospice / Nicolas Welch, Clinical Social Work Discharge Planning Services: NA Post Acute Care Choice: Hospice Living arrangements for the past 2 months: Single Family Home                 DME Arranged: N/A DME Agency: NA         HH Agency: [facility] Date [place]: 02/04/20 Time [place]: 1150 Representative spoke with at [place]: Nicolas Welch Arrangements/Services Living arrangements for the past 2 months: [place] with:: Significant Other Patient language and need for interpreter reviewed:: No        Need for Family Participation in Patient Care: Yes (Comment) Care giver support system in place?: Yes (comment)(Significant Other -Nicolas Welch)   Criminal Activity/Legal Involvement Pertinent to Current Situation/Hospitalization: No -  Comment as needed  Activities of Daily Living Home Assistive Devices/Equipment: Eyeglasses ADL Screening (condition at time of admission) Patient's cognitive ability adequate to safely complete daily activities?: Yes Is the patient deaf or have difficulty hearing?: No Does the patient have difficulty seeing, even when wearing glasses/contacts?: No Does the patient have difficulty concentrating, remembering, or making decisions?: Yes Patient able to express need for assistance with ADLs?: Yes Does the patient have difficulty dressing or bathing?: Yes Independently performs ADLs?: No Communication: Independent Dressing (OT): Needs assistance Is this a change from baseline?: Pre-admission baseline Grooming: Needs assistance Is this a change from baseline?: Pre-admission baseline Feeding: Independent Bathing: Needs assistance Is this a change from baseline?: Pre-admission baseline Toileting: Needs assistance Is this a change from baseline?: Pre-admission baseline In/Out Bed: Needs assistance Is this a change from baseline?: Pre-admission baseline Walks in Home: Dependent Is this a change from baseline?: Pre-admission baseline Does the patient have difficulty walking or climbing stairs?: Yes Weakness of Legs: Both Weakness of Arms/Hands: Both  Permission Sought/Granted Permission sought to share information with : Facility Sport and exercise psychologist, Family Supports Permission granted to share information with : Yes, Verbal Permission Granted  Share Information with NAME: Nicolas Welch  Permission granted to share info w AGENCY: [place]  Permission granted to share info w Relationship: Significant other  Permission granted to share info w Contact Information: [phone]  Emotional Assessment Appearance:: Appears stated age   Affect (typically observed): Calm Orientation: : Oriented to Self, Oriented to Place, Oriented to Situation Alcohol / Substance  Use: Not  Applicable Psych Involvement: No (comment)  Admission diagnosis:  Admission for end of life care [Z51.5] Intractable back pain [M54.9] Patient Active Problem List   Diagnosis Date Noted  . Admission for end of life care   . Intractable back pain 02/03/2020  . Closed fracture of multiple ribs   . End of life care   . Palliative care encounter   . Hypercalcemia of malignancy 02/01/2020  . Hypokalemia 02/01/2020  . Weight loss 11/12/2019  . Brain mass 10/29/2019  . Small cell carcinoma of right lung ([place]) 10/29/2019  . Encounter for antineoplastic chemotherapy 10/29/2019  . Encounter for antineoplastic immunotherapy 10/29/2019  . Mediastinal adenopathy   . Endobronchial cancer, right ([place])   . Non-small cell carcinoma of right lung, stage 4 ([place]) 10/05/2019  . Cancer associated pain 10/05/2019  . Goals of care, counseling/discussion 10/05/2019  . Bone metastasis ([address]) 10/05/2019  . Tobacco abuse 10/05/2019  . Heroin abuse ([place]) 10/05/2019   PCP:  Patient, No Pcp Per Pharmacy:   [place], [place] - [address] [place] [address] Phone: [phone] Fax: [facility][id number] [place], [place] - [address] [place] [address] Phone: [phone] Fax: [phone]     Social Determinants of Health (SDOH) Interventions    Readmission Risk Interventions No flowsheet data found.

## 2020-02-04 NOTE — Progress Notes (Addendum)
Daily Progress Note   Patient Name: Nicolas Welch       Date: 02/04/2020 DOB: Nov 02, 1970  Age: 49 y.o. MRN#: [id number] Attending Physician: Harold Hedge, MD Primary Care Physician: Patient, No Pcp Per Admit Date: 02/03/2020  Reason for Consultation/Follow-up: Symptom management and to discuss complex medical decision making related to patient's goals of care  Patient left AMA last evening and was re-admitted a short time later.  This morning when I am speaking to him he has eaten breakfast and is coherent.  Subjective:  I asked Nicolas Welch why he came to the hospital.  He responded because I can't go anywhere else.  I explained comfort care and that our current goal for him is to simply be comfortable and receive medications until he dies.  Nicolas Welch confirms this is what he wants.  I asked why?  Nicolas Welch states he can't do anything and he has constant severe pain.    We discussed his pain medications - he complains of severe pain in his back and right shoulder and tells me he has tumors there.  Nicolas Welch says not being able to sleep at night is very bad for him - he simply can not sleep at night.    Nicolas Welch tells me he has had a fecal impaction and needs stool softeners.   Assessment: Metastatic small cell and non-small cell.  Patient desires comfort care and EOL.  Waiting on a [place] bed.   Patient Profile/HPI:  49 y.o. 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 Family requested hospice bed and patient is currently waiting for a bed at [facility].  PMT was consulted for symptom management.  Cancer was diagnosed in January 2021.  Nicolas Welch  saw Dr. Julien Nordmann.  He received palliative chemo as well as radiation for bone pain.  In Dr. Worthy Flank note of 12/18/2019 Nicolas Welch had chosen to accept Hospice services.   Length of Stay: 0   Vital Signs: BP 126/87 (BP Location: Left Arm)   Pulse (!) 105   Temp 98.6 F (37 C) (Oral)   Resp 16   Ht 6' (1.829 m)   Wt 60.8 kg Comment: from 3 days  ago  SpO2 100%   BMI 18.18 kg/m  SpO2: SpO2: 100 % O2 Device: O2 Device: Room Air O2 Flow Rate:         Palliative Assessment/Data: 30%     Palliative Care Plan    Recommendations/Plan:  Discussed with Dr. Neysa Bonito.  This is a difficult situation.  Widespread metastatic cancer with very high opioid tolerance.  Impossible to control his pain outside of a hospital or hospice facility.    Likely not safe to send home with any type of opioid gtt.  Continue comfort measures - dilaudid gtt at 2 mg with PRN boluses.  Olanzapine for sleep.  thorazine PRN agitation as Haldol was ineffective.    Patient has a paradoxical reaction to benzodiazepines.  No ativan.  Waiting for [facility].  Code Status:  DNR  Prognosis:   < 2 weeks - wide spread metastatic cancer with no further oncologic treatment.  Severe pain refractory to opioid medications.  Hypercalcemia no longer being treated.  Discharge Planning:  Hospice facility  Care plan was discussed with Dr.  Neysa Bonito and RN  Thank you for allowing the Palliative Medicine Team to assist in the care of this patient.  Total time spent:  35 min.     Greater than 50%  of this time was spent counseling and coordinating care related to the above assessment and plan.  Florentina Jenny, PA-C Palliative Medicine  Please contact Palliative MedicineTeam phone at [phone] for questions and concerns between 7 am - 7 pm.   Please see AMION for individual provider pager numbers.

## 2020-02-05 MED ORDER — NICOTINE 14 MG/24HR TD PT24
14.0000 mg | MEDICATED_PATCH | Freq: Every day | TRANSDERMAL | Status: DC
  Administered 2020-02-05: 14 mg via TRANSDERMAL
  Filled 2020-02-05: qty 1

## 2020-02-05 MED ORDER — ALPRAZOLAM 1 MG PO TABS
1.0000 mg | ORAL_TABLET | Freq: Three times a day (TID) | ORAL | Status: DC
  Administered 2020-02-05 – 2020-02-06 (×3): 1 mg via ORAL
  Filled 2020-02-05 (×3): qty 2

## 2020-02-05 MED ORDER — METHADONE HCL 10 MG PO TABS
10.0000 mg | ORAL_TABLET | Freq: Three times a day (TID) | ORAL | Status: DC
  Administered 2020-02-05 – 2020-02-06 (×4): 10 mg via ORAL
  Filled 2020-02-05 (×3): qty 1

## 2020-02-05 NOTE — TOC Transition Note (Signed)
Transition of Care [facility]) - CM/SW Discharge Note   Patient Details  Name: Nicolas Welch MRN: [id number] Date of Birth: 1971/02/13  Transition of Care [facility]) CM/SW Contact:  Lynnell Catalan, RN Phone Number: 02/05/2020, 3:46 PM   Clinical Narrative:    Pt to dc to [place] today. Pt to transport via [facility]. RN has called report to Hospice RN. Yellow DNR on chart for transport.   Final next level of care: [place] Barriers to Discharge: Continued Medical Work up     Discharge Plan and Services In-house Referral: Hospice / Palliative Care, Clinical Social Work Discharge Planning Services: NA Post Acute Care Choice: Hospice          DME Arranged: N/A DME Agency: NA         HH Agency: [facility] Date [place]: 02/04/20 Time [place]: 1150 Representative spoke with at [place]: [place] Determinants of Health ([place]) Interventions     Readmission Risk Interventions No flowsheet data found.

## 2020-02-05 NOTE — Discharge Summary (Signed)
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  5/29: Placed on PCA pump. IV fluids increased and given dose of zoledronic acid and calcitonin for hypercalcemia. Overnight patient was agitated and confused likely from Ativan and all opiates and benzos discontinued 5/30: Palliative care consulted, patient transitionedto comfort measures and placed on PCA pump once again. Unfortunately he left AMA that evening  5/30 PM: returned just hours after leaving AMA. Used illicit fentanyl at home without improvement in pain and returned for pain control/comfort measures.   6/1: Discharged to [place] for further comfort care   A & P   Principal Problem:   Intractable back pain Active Problems:   Cancer associated pain   Bone metastasis (HCC)   Small cell carcinoma of right lung (HCC)   Hypercalcemia of malignancy   Hypokalemia   Admission for end of life care   Comfort measures only status    Small cell and non-small cell lung cancer with mets to brain and spine with cancer related pain 1. Diagnosed in January of this year s/p palliative radiation and chemotherapy which she did not tolerate 2. Follows with [facility], initial plan  for [facility] however patient had greater than 2 weeks prognosis and so instead was able to secure a bed at [facility] hospice  Intractable pain S/p fall from several stairs up with subsequent right fourth rib fracture and mildly displaced left ninth rib fracture with acute on chronic cancer related pain 1. Required high concentrations of Dilaudid PCA as well as olanzapine nightly for sleep. 2. Methadone and alprazolam added by palliative care today 3. Hospice to help with comfort/pain control  IV Drug Abuse with very high pain tolerance and opiate tolerance    Consultants  . Palliative care  Procedures  . None  Antibiotics   Anti-infectives (From admission, onward)   None       Subjective   Improved pain control but still required increasing doses of Dilaudid PCA.  Appreciates the care he is getting.  No other issues  Objective   Discharge Exam: Vitals:   02/04/20 0744 02/05/20 0828  BP:    Pulse:    Resp: 16   Temp:    SpO2:  99%   Vitals:   02/04/20 0535 02/04/20 0743 02/04/20 0744 02/05/20 0828  BP: 126/87     Pulse: (!) 105     Resp: (!) 26  16   Temp: 98.6 F (37 C)     TempSrc: Oral     SpO2: 99% 100%  99%  Weight:      Height:        Physical Exam Vitals and nursing note reviewed.  Constitutional:      Comments: Chronically ill-appearing, frail cachectic male  HENT:     Mouth/Throat:  Mouth: Mucous membranes are moist.  Eyes:     Conjunctiva/sclera: Conjunctivae normal.  Cardiovascular:     Rate and Rhythm: Normal rate and regular rhythm.  Pulmonary:     Effort: Pulmonary effort is normal.     Breath sounds: Normal breath sounds.  Abdominal:     General: Bowel sounds are normal.  Musculoskeletal:        General: No swelling or tenderness.  Neurological:     Mental Status: He is alert. Mental status is at baseline.  Psychiatric:        Mood and Affect: Mood normal.        Behavior: Behavior normal.       The  results of significant diagnostics from this hospitalization (including imaging, microbiology, ancillary and laboratory) are listed below for reference.     Microbiology: Recent Results (from the past 240 hour(s))  SARS Coronavirus 2 by RT PCR (hospital order, performed in [facility] hospital lab) Nasopharyngeal Nasopharyngeal Swab     Status: None   Collection Time: 02/01/20  6:28 PM   Specimen: Nasopharyngeal Swab  Result Value Ref Range Status   SARS Coronavirus 2 NEGATIVE NEGATIVE Final    Comment: (NOTE) SARS-CoV-2 target nucleic acids are NOT DETECTED. The SARS-CoV-2 RNA is generally detectable in upper and lower respiratory specimens during the acute phase of infection. The lowest concentration of SARS-CoV-2 viral copies this assay can detect is 250 copies / mL. A negative result does not preclude SARS-CoV-2 infection and should not be used as the sole basis for treatment or other patient management decisions.  A negative result may occur with improper specimen collection / handling, submission of specimen other than nasopharyngeal swab, presence of viral mutation(s) within the areas targeted by this assay, and inadequate number of viral copies (<250 copies / mL). A negative result must be combined with clinical observations, patient history, and epidemiological information. Fact Sheet for Patients:   StrictlyIdeas.no Fact Sheet for Healthcare Providers: BankingDealers.co.za This test is not yet approved or cleared  by the [place] FDA and has been authorized for detection and/or diagnosis of SARS-CoV-2 by FDA under an Emergency Use Authorization (EUA).  This EUA will remain in effect (meaning this test can be used) for the duration of the COVID-19 declaration under Section 564(b)(1) of the Act, 21 U.S.C. section 360bbb-3(b)(1), unless the authorization is terminated or revoked sooner. Performed at [facility], [place] [address] [place], [place] [address]      Labs: BNP (last 3 results) No results for input(s): BNP in the last 8760 hours. Basic Metabolic Panel: Recent Labs  Lab 02/01/20 1433 02/02/20 0539 02/02/20 0726 02/02/20 1118 02/02/20 1535 02/02/20 1724 02/02/20 2235 02/03/20 0223 02/03/20 0906  NA 134*   < >  --  134* 132*  --  135 136 134*  K 2.7*   < >  --  3.2* 2.8*  --  3.9 3.7 3.4*  CL 95*   < >  --  101 99  --  102 103 103  CO2 29   < >  --  25 26  --  24 23 21*  GLUCOSE 93   < >  --  100* 95  --  92 102* 96  BUN 24*   < >  --  19 18  --  15 16 15   CREATININE 0.98   < >  --  0.95 0.96  --  0.92 0.82 0.88  CALCIUM 13.9*   < >  --  12.8* 13.0*  --  13.1* 12.8* 12.0*  MG 1.5*  --  1.2*  --   --  2.7*  --  2.0  --    < > = values in this interval not displayed.   Liver Function Tests: Recent Labs  Lab 02/02/20 1118 02/02/20 1535 02/02/20 2235 02/03/20 0223 02/03/20 0906  AST 30 31 32 32 28  ALT 18 17 18 18 17   ALKPHOS 130* 133* 133* 136* 123  BILITOT 0.6 0.9 0.7 0.8 0.9  PROT 6.6 6.8 7.1 7.1 6.8  ALBUMIN 2.3* 2.3* 2.3* 2.4* 2.3*   No results for input(s): LIPASE, AMYLASE in the last 168 hours. No results for input(s): AMMONIA in the last 168 hours. CBC: Recent Labs  Lab 02/01/20 1433  WBC 13.4*  NEUTROABS 12.1*  HGB 8.5*  HCT 26.5*  MCV 91.4  PLT 180   Cardiac Enzymes: No results for input(s): CKTOTAL, CKMB, CKMBINDEX, TROPONINI in the last 168 hours. BNP: Invalid input(s): POCBNP CBG: No results for input(s): GLUCAP in the last 168 hours. D-Dimer No results for input(s): DDIMER in the last 72 hours. Hgb A1c No results for input(s): HGBA1C in the last 72 hours. Lipid Profile No results for input(s): CHOL, HDL, LDLCALC, TRIG, CHOLHDL, LDLDIRECT in the last 72 hours. Thyroid function studies No results for input(s): TSH, T4TOTAL, T3FREE, THYROIDAB in the last 72 hours.  Invalid input(s): FREET3 Anemia work up No results for  input(s): VITAMINB12, FOLATE, FERRITIN, TIBC, IRON, RETICCTPCT in the last 72 hours. Urinalysis No results found for: COLORURINE, APPEARANCEUR, [place], [place], [place], [place], [place], [place], PROTEINUR, UROBILINOGEN, NITRITE, LEUKOCYTESUR Sepsis Labs Invalid input(s): PROCALCITONIN,  WBC,  LACTICIDVEN Microbiology Recent Results (from the past 240 hour(s))  SARS Coronavirus 2 by RT PCR (hospital order, performed in [facility] hospital lab) Nasopharyngeal Nasopharyngeal Swab     Status: None   Collection Time: 02/01/20  6:28 PM   Specimen: Nasopharyngeal Swab  Result Value Ref Range Status   SARS Coronavirus 2 NEGATIVE NEGATIVE Final    Comment: (NOTE) SARS-CoV-2 target nucleic acids are NOT DETECTED. The SARS-CoV-2 RNA is generally detectable in upper and lower respiratory specimens during the acute phase of infection. The lowest concentration of SARS-CoV-2 viral copies this assay can detect is 250 copies / mL. A negative result does not preclude SARS-CoV-2 infection and should not be used as the sole basis for treatment or other patient management decisions.  A negative result may occur with improper specimen collection / handling, submission of specimen other than nasopharyngeal swab, presence of viral mutation(s) within the areas targeted by this assay, and inadequate number of viral copies (<250 copies / mL). A negative result must be combined with clinical observations, patient history, and epidemiological information. Fact Sheet for Patients:   StrictlyIdeas.no Fact Sheet for Healthcare Providers: BankingDealers.co.za This test is not yet approved or cleared  by the [place] FDA and has been authorized for detection and/or diagnosis of SARS-CoV-2 by FDA under an Emergency Use Authorization (EUA).  This EUA will remain in effect (meaning this test can be used) for the duration of the COVID-19 declaration under  Section 564(b)(1) of the Act, 21 U.S.C. section 360bbb-3(b)(1), unless the authorization is terminated or revoked sooner. Performed at [facility], [place] [address], [place], [place] [address]     Discharge Instructions     Discharge Instructions    Diet general   Complete by: As directed    Increase activity slowly   Complete by: As  directed      Allergies as of 02/05/2020   No Known Allergies     Medication List    STOP taking these medications   acetaminophen 500 MG tablet Commonly known as: TYLENOL   albuterol (2.5 MG/3ML) 0.083% nebulizer solution Commonly known as: PROVENTIL   fentaNYL 100 MCG/HR Commonly known as: DURAGESIC   ibuprofen 200 MG tablet Commonly known as: ADVIL   oxyCODONE-acetaminophen 10-325 MG tablet Commonly known as: PERCOCET   prochlorperazine 10 MG tablet Commonly known as: COMPAZINE   Trelegy Ellipta 100-62.5-25 MCG/INH Aepb Generic drug: Fluticasone-Umeclidin-Vilant       No Known Allergies  Dispo: The patient is from: Home              Anticipated d/c is to: [facility]              Anticipated d/c date is: today              Patient currently is medically stable to d/c.        Time coordinating discharge: Over 30 minutes   SIGNED:   Harold Hedge, D.O. [facility] Pager: [phone]  02/05/2020, 3:22 PM

## 2020-02-05 NOTE — Progress Notes (Signed)
Engineer, maintenance [facility]) Hospital Liaison note.     [place] does not have availability today. Hospital Liaison will follow up tomorrow or sooner if a room becomes available.     A Please do not hesitate to call with questions.     Thank you,    Farrel Gordon, RN, [facility]       [place] (listed on AMION under [place])     [phone]

## 2020-02-05 NOTE — Progress Notes (Signed)
56mL of 2mg /mL dilaudid wasted in WIS w Dorris Fetch, RN

## 2020-02-05 NOTE — Progress Notes (Signed)
Palliative Care Progress Note Reason: Symptom Management  Patient continues to need frequent dose increases of hydromorphone infusion. He has complex pain management needs in the setting of severe opioid use disorder and terminal metastatic cancer. He is currently on a hydromorphone infusion at 5mg /hr with a bolus dose of 1-2 mg every 30 minutes. He has used 4 2mg  doses in last 24 hours. He continues to struggle with persistent pain- suspect he is beginning to decline today- seems lethargic, unable to move in bed without pain. Appetite is much worse. He is anxious- family member is at bedside.  Recommendations:  1. Doubt continued titration of his hydromorphone infusion will result in sustained relief given his opioid history and misuse of heroin and fentanyl. Will add on methadone 10mg  TID for opitmal pain relief.   2. For his anxiety will add Alprazolam 1mg  po q8 hours-0 he says this has worked for him in the past but that he cannot take ativan.  Given complex pain needs will likely need a hospice facility for his terminal care.  Discussed plan with patient and his family member-they are agreeable with this plan.  Lane Hacker, DO Palliative Medicine   Time: 35 min Greater than 50%  of this time was spent counseling and coordinating care related to the above assessment and plan.

## 2020-02-06 NOTE — TOC Transition Note (Signed)
Transition of Care [facility]) - CM/SW Discharge Note   Patient Details  Name: Nicolas Welch MRN: [id number] Date of Birth: 07-19-1971  Transition [facility] Care [facility]) CM/SW Contact:  Pier Laux, Marjie Skiff, RN Phone Number: 02/06/2020, 9:31 AM   Clinical Narrative:    Pt was unable to dc to [place] yesterday due to facility not having IV Dilaudid for pt pain management. This CM was contacted by [facility] this morning to confirm that they have the dilaudid now. Family and MD informed. PTAR contacted for transport.   Final next level of care: [place] Barriers to Discharge: Continued Medical Work up      [place] Date [place]: 02/04/20 Time [place]: 77 Representative spoke with at [place]: [place]  Social Determinants of Health ([place]) Interventions     Readmission Risk Interventions No flowsheet data found.

## 2020-02-06 NOTE — Progress Notes (Signed)
Report given to receiving nurse at [facility].

## 2020-02-06 NOTE — Progress Notes (Signed)
Patient was discharged to [facility] yesterday however according to reports from case manager, hospice did not have IV pain medication/Dilaudid for him so his discharge was held and now he is going to be discharged today.  Patient seen in his room.  Wife present at bedside.  Patient lethargic.  Unable to communicate but looks comfortable.  No change from yesterday.

## 2020-05-16 IMAGING — CT CT ABD-PELV W/ CM
2 of 5 series · 11 of 36 positions shown, 13 images · IV contrast (omnipaque)
Comparison: PET-CT dated 11/02/2019.  CT chest dated 10/03/2019.

CLINICAL DATA: Extensive stage small cell lung cancer, status post
2 cycles of chemotherapy, assess treatment response

EXAM:
CT CHEST, ABDOMEN, AND PELVIS WITH CONTRAST
TECHNIQUE: Multidetector CT imaging of the chest, abdomen and pelvis was
performed following the standard protocol during bolus
administration of intravenous contrast.
CONTRAST:  100mL OMNIPAQUE IOHEXOL 300 MG/ML  SOLN

[Series 2: cap with · axial · 0.75mm/px · z∈[-655,-125]mm · 8 of 134 slices shown, 10 images]
[im 14/134  mediastinal]
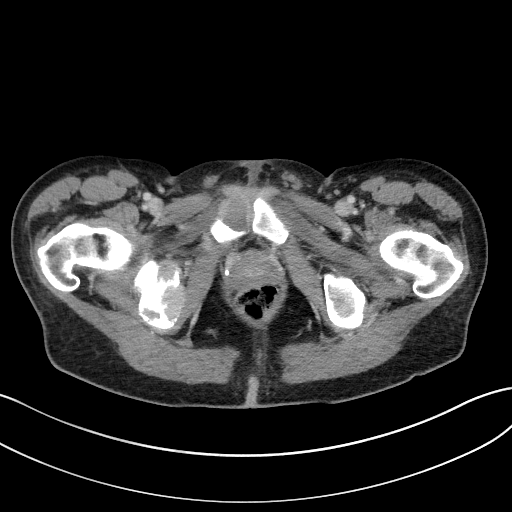
[im 14/134  lung]
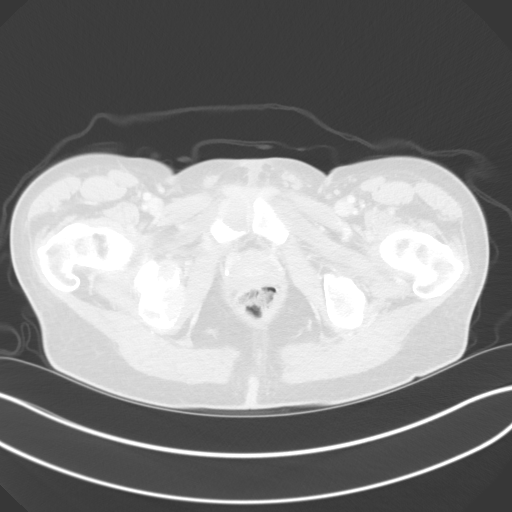
[im 27/134  lung]
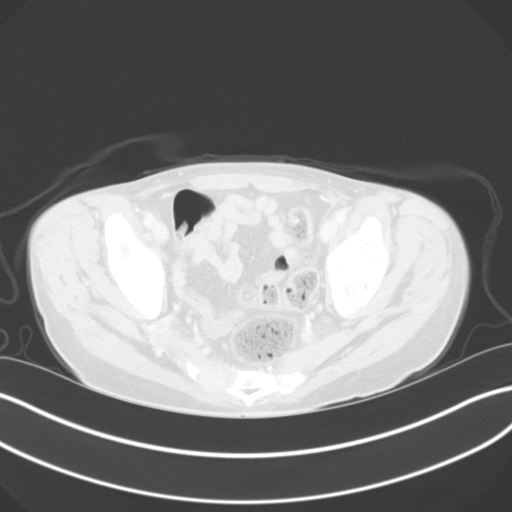
[im 40/134  lung]
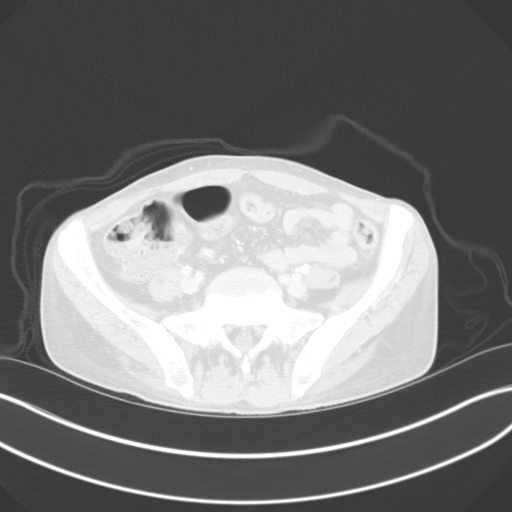
[im 54/134  lung]
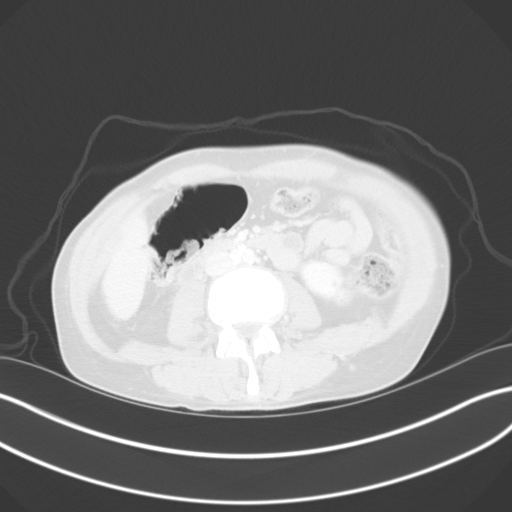
[im 80/134  mediastinal]
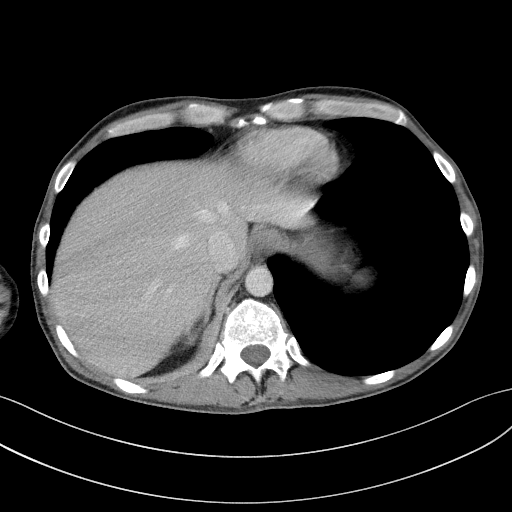
[im 80/134  lung]
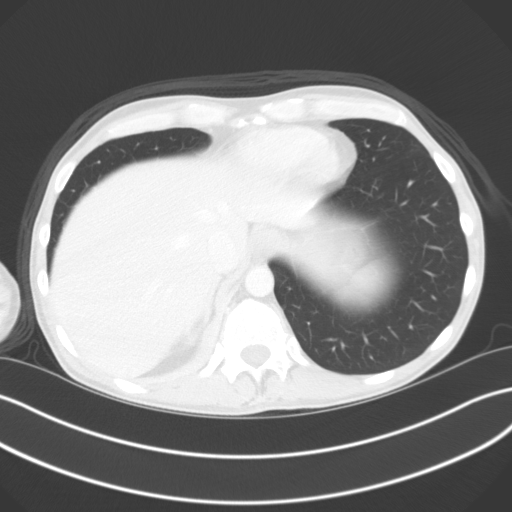
[im 94/134  lung]
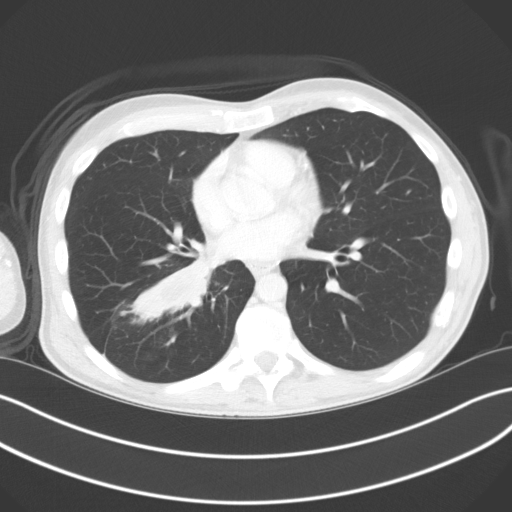
[im 107/134  lung]
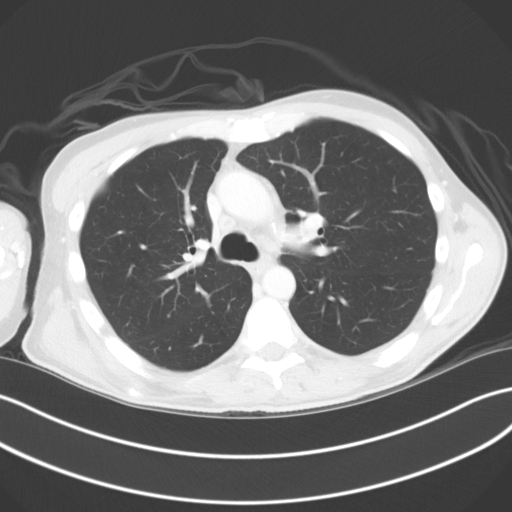
[im 120/134  lung]
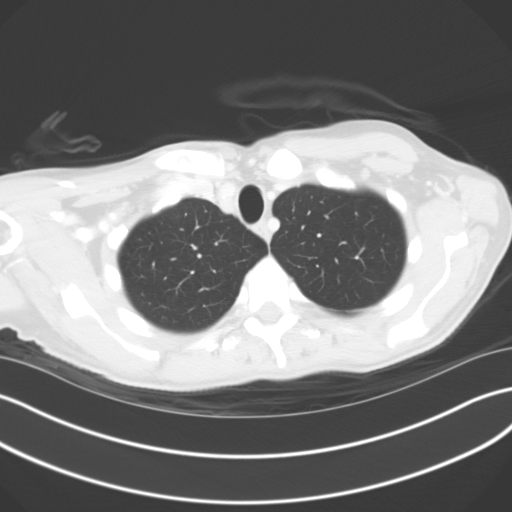

[Series 5: coronals · coronal · 0.76mm/px · 3 of 126 slices shown]
[im 26/126  lung]
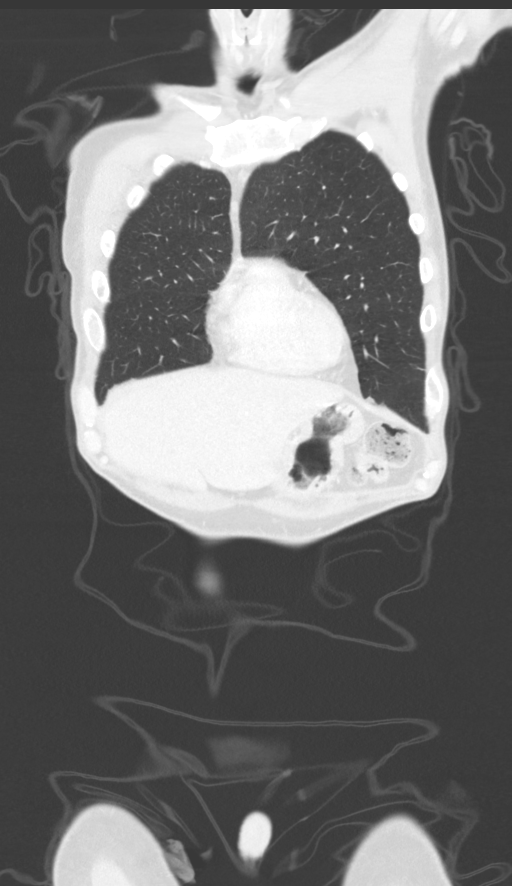
[im 51/126  lung]
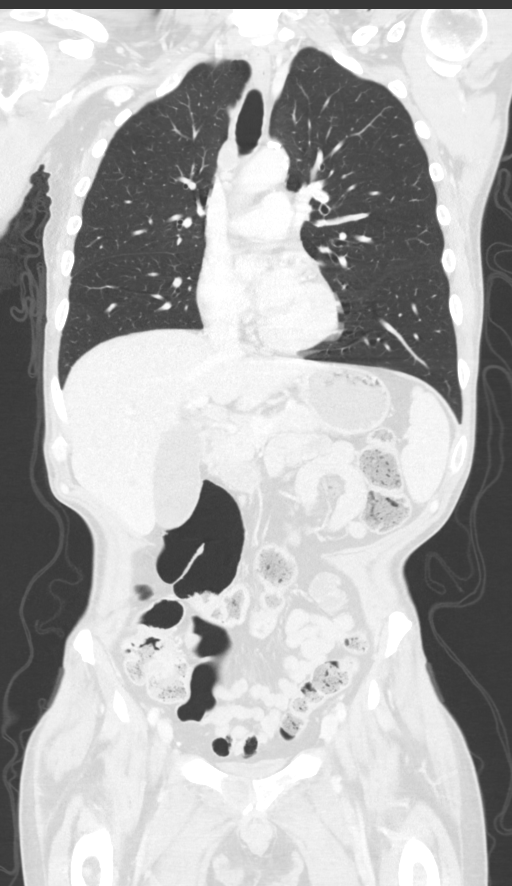
[im 76/126  lung]
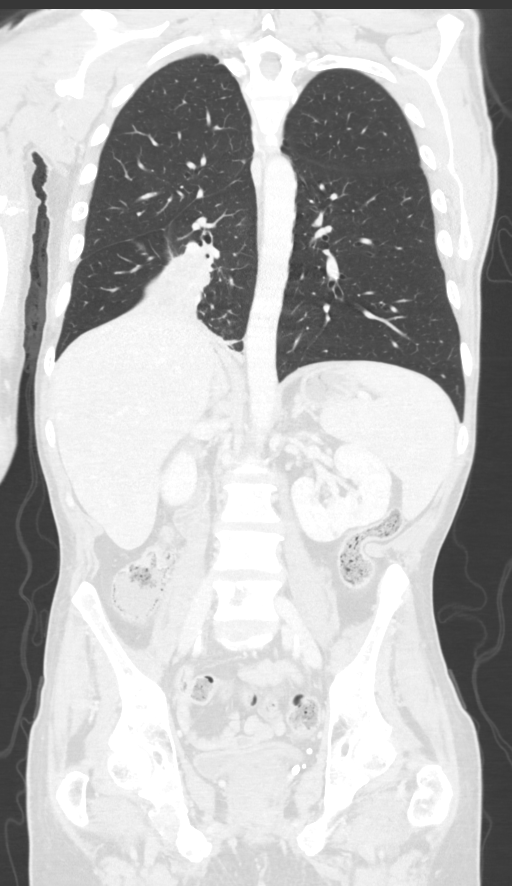

[11 of 36 positions shown; findings below may reference images not displayed]

FINDINGS: CT CHEST FINDINGS

Cardiovascular: The heart is normal in size. No pericardial
effusion.

No evidence of thoracic aortic aneurysm. Atherosclerotic
calcifications of the aortic arch.

Mild atherosclerosis of the LAD.

Mediastinum/Nodes: Thoracic lymphadenopathy is similar when compared
to prior CT chest (and difficult to measure on PET), including:

--14 mm short axis high right paratracheal node (series 2/image 24),
previously 16 mm

--16 mm short axis right hilar node (series 2/image 33), previously
15 mm

--Dominant 18 mm subcarinal node (series 2/image 34), previously 19
mm

Visualized thyroid is unremarkable.

Lungs/Pleura: 2.1 x 4.4 cm central right lower lobe mass (series
2/image 40), previously 3.3 x 3.8 cm on CT (and difficult to measure
on PET). Associated segmental postobstructive anterior left lower
lobe opacity.

Scattered small right lung nodules measuring up to 3-4 mm,
unchanged.

No pleural effusion or pneumothorax.

Musculoskeletal: Progressive bilateral chest wall/rib metastases
when compared to recent PET, including:

--2.1 x 3.1 cm mass along the right anterior 3rd rib (series 2/image
24), previously 1.7 x 2.1 cm

--2.8 x 5.3 cm mass along the left posterior 8th rib (series 2/image
34), previously 2.7 x 4.6 cm

Additional widespread multifocal lytic and osseous metastases
throughout the visualized appendicular skeleton, progressive when
compared to recent CT. Moderate pathologic compression fracture at
T5 (sagittal image 102), progressive. Mild superior endplate loss of
height at T8, new.

CT ABDOMEN PELVIS FINDINGS

Hepatobiliary: Dominant 3.2 x 4.6 cm mass inferiorly in segment 6 of
the liver (series 2/image 74), progressive from prior CT, difficult
to discretely measure on prior PET. Additional scattered
subcentimeter hepatic lesions in both lobes which are new,
suggesting multifocal metastases, approximately 8-10 in number.

Suspected layering noncalcified gallstone (series 2/image 73). No
associated inflammatory changes. No intrahepatic or extrahepatic
ductal dilatation.

Pancreas: Within normal limits.

Spleen: 8 mm lesion in the medial spleen (series 2/image 58). This
is slightly more conspicuous/increased when compared recent CT but
is non FDG avid on recent PET, and remains indeterminate.

Adrenals/Urinary Tract: Adrenal glands are within normal limits.

Mildly heterogeneous perfusion of the bilateral kidneys,
nonspecific. No hydronephrosis.

Bladder is within normal limits.

Stomach/Bowel: Stomach is within normal limits.

No evidence of bowel obstruction.

Appendix is not discretely visualized.

No colonic wall thickening or mass is seen.

Vascular/Lymphatic: No evidence of abdominal aortic aneurysm.

Atherosclerotic calcifications of the abdominal aorta and branch
vessels.

Small upper abdominal lymph nodes at the porta hepatis, non FDG avid
on PET.

No suspicious abdominopelvic lymphadenopathy.

Reproductive: Prostate is unremarkable.

Other: No abdominopelvic ascites.

Musculoskeletal: Multifocal osseous metastases throughout the
visualized axial and appendicular skeleton. A dominant soft tissue
mass in the right posterior ischium measures 3.0 x 4.2 cm (series
2/image 125), previously 2.7 x 3.4 cm on recent PET, progressive.

Dominant lytic lesions involving the posterior aspect of L1, L3, and
L5. Mild loss of height at L3 and L5, new. Dominant lytic lesion
involving the anterior aspect of S1, without loss of height.
IMPRESSION: Central right lower lobe mass with postobstructive opacity,
corresponding to the patient's known primary bronchogenic neoplasm,
similar.

Thoracic lymphadenopathy, grossly unchanged.

Progressive hepatic metastases, with dominant index lesion as above.

Widespread osseous metastases throughout the visualized axial and
appendicular skeleton, progressive. Associated pathologic fracture
at T5, T8, L3, and L5.

Additional ancillary findings as above.

## 2023-06-09 ENCOUNTER — Other Ambulatory Visit: Payer: Self-pay | Admitting: Physician Assistant
